# Patient Record
Sex: Male | Born: 1990 | Race: Black or African American | Hispanic: No | Marital: Married | Smoking: Current some day smoker
Health system: Southern US, Community
[De-identification: ages and names within clinical notes are randomized; demographics above are authoritative.]

## PROBLEM LIST (undated history)

## (undated) DIAGNOSIS — F419 Anxiety disorder, unspecified: Secondary | ICD-10-CM

## (undated) DIAGNOSIS — F329 Major depressive disorder, single episode, unspecified: Secondary | ICD-10-CM

## (undated) DIAGNOSIS — F909 Attention-deficit hyperactivity disorder, unspecified type: Secondary | ICD-10-CM

## (undated) DIAGNOSIS — J45909 Unspecified asthma, uncomplicated: Secondary | ICD-10-CM

## (undated) DIAGNOSIS — F32A Depression, unspecified: Secondary | ICD-10-CM

---
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
## 2009-06-20 ENCOUNTER — Emergency Department (HOSPITAL_COMMUNITY): Admission: EM | Admit: 2009-06-20 | Discharge: 2009-06-20 | Payer: Self-pay | Admitting: Emergency Medicine

## 2009-07-05 ENCOUNTER — Emergency Department (HOSPITAL_COMMUNITY): Admission: EM | Admit: 2009-07-05 | Discharge: 2009-07-06 | Payer: Self-pay | Admitting: Emergency Medicine

## 2009-07-28 ENCOUNTER — Emergency Department (HOSPITAL_COMMUNITY): Admission: EM | Admit: 2009-07-28 | Discharge: 2009-07-28 | Payer: Self-pay | Admitting: Emergency Medicine

## 2009-07-29 ENCOUNTER — Ambulatory Visit: Payer: Self-pay | Admitting: Internal Medicine

## 2009-07-29 ENCOUNTER — Emergency Department (HOSPITAL_COMMUNITY): Admission: EM | Admit: 2009-07-29 | Discharge: 2009-07-29 | Payer: Self-pay | Admitting: Emergency Medicine

## 2009-07-29 DIAGNOSIS — D638 Anemia in other chronic diseases classified elsewhere: Secondary | ICD-10-CM | POA: Insufficient documentation

## 2009-07-29 DIAGNOSIS — R079 Chest pain, unspecified: Secondary | ICD-10-CM | POA: Insufficient documentation

## 2009-07-29 DIAGNOSIS — N62 Hypertrophy of breast: Secondary | ICD-10-CM | POA: Insufficient documentation

## 2009-07-29 DIAGNOSIS — E876 Hypokalemia: Secondary | ICD-10-CM

## 2009-07-31 ENCOUNTER — Ambulatory Visit: Admission: RE | Admit: 2009-07-31 | Discharge: 2009-07-31 | Payer: Self-pay | Admitting: Internal Medicine

## 2009-07-31 ENCOUNTER — Encounter: Payer: Self-pay | Admitting: Internal Medicine

## 2009-07-31 ENCOUNTER — Ambulatory Visit: Payer: Self-pay | Admitting: Cardiology

## 2009-07-31 ENCOUNTER — Emergency Department (HOSPITAL_COMMUNITY): Admission: EM | Admit: 2009-07-31 | Discharge: 2009-07-31 | Payer: Self-pay | Admitting: Emergency Medicine

## 2009-08-04 ENCOUNTER — Telehealth: Payer: Self-pay | Admitting: Internal Medicine

## 2009-08-06 ENCOUNTER — Telehealth: Payer: Self-pay | Admitting: Internal Medicine

## 2009-08-15 ENCOUNTER — Telehealth: Payer: Self-pay | Admitting: Physician Assistant

## 2009-08-20 ENCOUNTER — Encounter (INDEPENDENT_AMBULATORY_CARE_PROVIDER_SITE_OTHER): Payer: Self-pay | Admitting: Internal Medicine

## 2009-09-19 ENCOUNTER — Emergency Department (HOSPITAL_COMMUNITY): Admission: EM | Admit: 2009-09-19 | Discharge: 2009-09-19 | Payer: Self-pay | Admitting: Emergency Medicine

## 2009-10-03 ENCOUNTER — Encounter (INDEPENDENT_AMBULATORY_CARE_PROVIDER_SITE_OTHER): Payer: Self-pay | Admitting: *Deleted

## 2009-12-18 ENCOUNTER — Emergency Department (HOSPITAL_COMMUNITY): Admission: EM | Admit: 2009-12-18 | Discharge: 2009-07-25 | Payer: Self-pay | Admitting: Emergency Medicine

## 2010-02-01 ENCOUNTER — Emergency Department (HOSPITAL_COMMUNITY)
Admission: EM | Admit: 2010-02-01 | Discharge: 2010-02-01 | Payer: Self-pay | Source: Home / Self Care | Admitting: Emergency Medicine

## 2010-02-03 LAB — POCT I-STAT, CHEM 8
BUN: 15 mg/dL (ref 6–23)
Chloride: 104 mEq/L (ref 96–112)
Glucose, Bld: 100 mg/dL — ABNORMAL HIGH (ref 70–99)
HCT: 44 % (ref 39.0–52.0)
Potassium: 4.2 mEq/L (ref 3.5–5.1)

## 2010-02-10 NOTE — Progress Notes (Signed)
Summary: pt wants to talk to Dr. Graciela Husbands  Phone Note Call from Patient Call back at Home Phone [phone]   Caller: Patient Reason for Call: Talk to Nurse, Talk to Doctor Summary of Call: pt wants to talk to Dr. Graciela Husbands about test results. He is rtning a call from yesterday, he has no idea who called him all he knows is the person said he dosen't need f/u and his echo was fine but he wants to talk to the Dr. to find out what he found. Initial call taken by: Omer Jack,  August 06, 2009 1:51 PM  Follow-up for Phone Call        [facility] Katina Dung, RN, BSN  August 06, 2009 2:06 PM  returning [phone] Glynda Jaeger  August 06, 2009 2:48 PM  I spoke with the pt and reviewed his echo results.  The pt has received a call from [facility] and an appt has been arranged.   Follow-up by: Julieta Gutting, RN, BSN,  August 06, 2009 4:24 PM

## 2010-02-10 NOTE — Letter (Signed)
Summary: *HSN Results Follow up  [facility]  [address] [place], [place] [address]   Phone: [phone]  Fax: [phone]      10/03/2009   Raynelle Dick Riegler [address] [place], [place]  [address]   Dear  Mr. Giuseppe Cleland,                            ____S.Drinkard,FNP   ____D. Gore,FNP       ____B. McPherson,MD   ____V. Rankins,MD    ____E. Mulberry,MD    ____N. Daphine Deutscher, FNP  ____D. Reche , MD    ____K. Philipp Deputy, MD    ____Other     This letter is to inform you that your recent test(s):  _______Pap Smear    _______Lab Test     _______X-ray    _______ is within acceptable limits  _______ requires a medication change  _______ requires a follow-up lab visit  _______ requires a follow-up visit with your provider   Comments:  We have been trying to reach you.  Please give [place] a call at your earliest convenience.       _________________________________________________________ If you have any questions, please contact our office                     Sincerely,  [place] Shannon [facility]

## 2010-02-10 NOTE — Assessment & Plan Note (Signed)
Summary: chest pains,dizziness   CC:  SOB with some lightheadedness.  Pt reports chest pains.  He was told this was due to muscles but patient disagrees.   .  History of Present Illness: Thomas Vaughn is seen at the request of the Emergency room becsuse of multiple visis over the last few days becasue of complaints of dizziness, shortness of breath and chest pain.  He has been a long time polysubstance abuser who becasue of symtpoms saaid he stopped drugs alcohol and cigareetes five days ago .  He has not had symcope, buthas had palpiationns and there is no family history of sudden death  His episodes are accompanied by acral parasethesia without perioral involvement.    He has some orthostatic intolerance as well.     He was told in the ER that his iron and potassium were low, Hgb 12.6 w normal MCV;  his potassium was 3.1 but previous eval had been 3.7  Current Medications (verified): 1)  Ibuprofen 600 Mg Tabs (Ibuprofen) .... Take One Tablet Every 8 Hrs As Needed For Pain  Allergies (verified): No Known Drug Allergies  Past History:  Past Medical History: Last updated: 07/28/2009 Bradycardia Tobacco abuse Alcohol abuse  Social History: Last updated: 07/28/2009 Tobacco Use - Yes.  1/2-1 ppd Alcohol Use - yes-heavy use (pint of liquor daily) Drug Use - yes-cannabis abuse  Vital Signs:  Patient profile:   20 year old male Height:      67 inches Weight:      144 pounds BMI:     22.64 O2 Sat:      99 % on Room air Pulse rate:   69 / minute Pulse rhythm:   regular BP sitting:   132 / 70  (left arm) Cuff size:   regular  Vitals Entered By: Judithe Modest CMA (July 29, 2009 3:00 PM)  O2 Flow:  Room air  Physical Exam  General:  Alert and oriented yound african [place] male with his shorts at his knees and ECG stickers( 2 sets) still on chest from ER  in no acute distress. HEENT  normal . Neck veins were flat; carotids brisk and full without bruits. No lymphadenopathy.  Back without kyphosis. leftsided gynecomastia Lungs clear. Heart sounds regular without murmurs or gallops. PMI nondisplaced. Abdomen soft with active bowel sounds without midline pulsation or hepatomegaly. Femoral pulses and distal pulses intact. Extremities were without clubbing cyanosis or edemaSkin warm and dry. Neurological exam grossly normal affect is engaging   Impression & Recommendations:  Problem # 1:  CHEST PAIN UNSPECIFIED (ICD-786.50) The patients symptoms are hard to sort out by my history and in the context of polysubstance use and now abrupt disuse.  We will check an echo and the ECG is normal apart from early repolarization.  If that is abnormal, will need more extensive cardiac evaluation , otherwise not.   Orders: Misc. Referral (Misc. 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  He will need medical evalauation.  the other interesting thing is wheterh it might be related to the hypokalemia via a mineralocortacoid process  Patient Instructions: 1)  Your physician has requested that you have an echocardiogram.  Echocardiography is a painless test that uses sound waves to create images of your heart. It provides your doctor with information about the size and shape of your heart and how well your heart's chambers  and valves are working.  This procedure takes approximately one hour. There are no restrictions for this procedure. 2)  You have been referred to Thomas Newcomer, PA at [facility].

## 2010-02-10 NOTE — Progress Notes (Signed)
  Phone Note Outgoing Call   Summary of Call: Please get this patient scheduled with me once he has had eligibility. Reply to me with date of appt. Initial call taken by: Tereso Newcomer PA-C,  August 15, 2009 10:23 PM  Follow-up for Phone Call        Left message with relative member to return phone called.Manon Hilding  August 18, 2009 9:14 AM  LEFT A MESSAGE ON PT VOICE MAIL.Manon Hilding  August 19, 2009 4:12 PM  Unable to reach the pt over the phone so I sent a no show letter.Manon Hilding  August 20, 2009 9:40 AM     Appended Document:     Phone Note Outgoing Call   Summary of Call: Have we heard from this patient?  We were trying to get him in for an appt.  He should have been set up for eligibility. Initial call taken by: Brynda Rim,  September 30, 2009 2:13 PM  Follow-up for Phone Call        Left message on answering machine for pt to call back.Marland KitchenMarland KitchenMarland KitchenArmenia Shannon  September 30, 2009 3:28 PM   Left message on answering machine for pt to call back.Marland KitchenMarland KitchenMarland KitchenArmenia Shannon  October 01, 2009 12:40 PM   Left message on answering machine for pt to call.... will mail letter [facility]  October 03, 2009 8:46 AM

## 2010-02-10 NOTE — Letter (Signed)
Summary: Letter//MAILED TO PT  Letter//MAILED TO PT   Imported By: Arta Bruce 08/28/2009 11:11:03  _____________________________________________________________________  External Attachment:    Type:   Image     Comment:   External Document

## 2010-02-10 NOTE — Progress Notes (Signed)
Summary: results of ultrasound/when follow up needed  Phone Note Call from Patient   Caller: Patient Reason for Call: Talk to Nurse Summary of Call: pt seen last week-was referred to [place] for heart us-wants to know if he needs follow up-and if we have the resultspls call [phone] Initial call taken by: Glynda Jaeger,  August 04, 2009 12:16 PM  Follow-up for Phone Call        spoke with pt, aware will call after dr Graciela Husbands reviews testing Deliah Goody, RN  August 04, 2009 12:38 PM   Additional Follow-up for Phone Call Additional follow up Details #1::        echo normal, no cardiac follwup,  [facility]  [place] [facility] has been contacted and they are working on checking eligibility Additional Follow-up by: Nathen May, MD, [facility],  August 05, 2009 9:18 AM     Appended Document: results of ultrasound/when follow up needed Pt notified of Dr Odessa Fleming recommendations.

## 2010-03-09 ENCOUNTER — Emergency Department (HOSPITAL_COMMUNITY)
Admission: EM | Admit: 2010-03-09 | Discharge: 2010-03-10 | Disposition: A | Discharge status: Home / Self Care | Payer: Self-pay | Attending: Emergency Medicine | Admitting: Emergency Medicine

## 2010-03-09 DIAGNOSIS — F1010 Alcohol abuse, uncomplicated: Secondary | ICD-10-CM | POA: Insufficient documentation

## 2010-03-09 DIAGNOSIS — F1210 Cannabis abuse, uncomplicated: Secondary | ICD-10-CM | POA: Insufficient documentation

## 2010-03-09 DIAGNOSIS — F17200 Nicotine dependence, unspecified, uncomplicated: Secondary | ICD-10-CM | POA: Insufficient documentation

## 2010-03-11 ENCOUNTER — Emergency Department (HOSPITAL_BASED_OUTPATIENT_CLINIC_OR_DEPARTMENT_OTHER)
Admission: EM | Admit: 2010-03-11 | Discharge: 2010-03-11 | Disposition: A | Discharge status: Home / Self Care | Payer: Self-pay | Attending: Emergency Medicine | Admitting: Emergency Medicine

## 2010-03-11 DIAGNOSIS — R109 Unspecified abdominal pain: Secondary | ICD-10-CM | POA: Insufficient documentation

## 2010-03-11 DIAGNOSIS — F17200 Nicotine dependence, unspecified, uncomplicated: Secondary | ICD-10-CM | POA: Insufficient documentation

## 2010-03-11 DIAGNOSIS — K2970 Gastritis, unspecified, without bleeding: Secondary | ICD-10-CM | POA: Insufficient documentation

## 2010-03-11 DIAGNOSIS — R209 Unspecified disturbances of skin sensation: Secondary | ICD-10-CM | POA: Insufficient documentation

## 2010-03-11 LAB — COMPREHENSIVE METABOLIC PANEL
ALT: 16 U/L (ref 0–53)
AST: 37 U/L (ref 0–37)
Alkaline Phosphatase: 82 U/L (ref 39–117)
CO2: 23 mEq/L (ref 19–32)
Chloride: 104 mEq/L (ref 96–112)
GFR calc Af Amer: >60 mL/min (ref >60)
GFR calc non Af Amer: >60 mL/min (ref >60)
Glucose, Bld: 75 mg/dL (ref 70–99)
Sodium: 141 mEq/L (ref 135–145)
Total Bilirubin: 0.8 mg/dL (ref 0.3–1.2)

## 2010-03-11 LAB — CBC
HCT: 38.3 % — ABNORMAL LOW (ref 39.0–52.0)
Hemoglobin: 13.4 g/dL (ref 13.0–17.0)
RBC: 4.63 MIL/uL (ref 4.22–5.81)

## 2010-03-11 LAB — GLUCOSE, CAPILLARY: Glucose-Capillary: 72 mg/dL (ref 70–99)

## 2010-03-28 LAB — POCT CARDIAC MARKERS
CKMB, poc: <1 ng/mL — ABNORMAL LOW (ref 1.0–8.0)
CKMB, poc: <1 ng/mL — ABNORMAL LOW (ref 1.0–8.0)
Myoglobin, poc: 36.1 ng/mL (ref 12–200)
Myoglobin, poc: 36.9 ng/mL (ref 12–200)
Myoglobin, poc: 49 ng/mL (ref 12–200)
Myoglobin, poc: 51.7 ng/mL (ref 12–200)
Troponin i, poc: <0.05 ng/mL (ref 0.00–0.09)

## 2010-03-28 LAB — POCT I-STAT, CHEM 8
BUN: 15 mg/dL (ref 6–23)
BUN: 7 mg/dL (ref 6–23)
Calcium, Ion: 1.17 mmol/L (ref 1.12–1.32)
Chloride: 106 mEq/L (ref 96–112)
Creatinine, Ser: 1 mg/dL (ref 0.4–1.5)
Creatinine, Ser: 1.2 mg/dL (ref 0.4–1.5)
Hemoglobin: 13.6 g/dL (ref 13.0–17.0)
Hemoglobin: 13.9 g/dL (ref 13.0–17.0)
Potassium: 3.2 mEq/L — ABNORMAL LOW (ref 3.5–5.1)
Potassium: 3.8 mEq/L (ref 3.5–5.1)
Sodium: 139 mEq/L (ref 135–145)
Sodium: 140 mEq/L (ref 135–145)
Sodium: 140 mEq/L (ref 135–145)
TCO2: 18 mmol/L (ref 0–100)
TCO2: 21 mmol/L (ref 0–100)
TCO2: 22 mmol/L (ref 0–100)

## 2010-03-28 LAB — RAPID URINE DRUG SCREEN, HOSP PERFORMED
Barbiturates: NOT DETECTED
Benzodiazepines: POSITIVE — AB
Cocaine: NOT DETECTED
Cocaine: NOT DETECTED
Tetrahydrocannabinol: NOT DETECTED

## 2010-03-28 LAB — URINALYSIS, ROUTINE W REFLEX MICROSCOPIC
Glucose, UA: NEGATIVE mg/dL
Ketones, ur: NEGATIVE mg/dL
pH: 6.5 (ref 5.0–8.0)

## 2010-03-28 LAB — D-DIMER, QUANTITATIVE: D-Dimer, Quant: <0.22 ug/mL-FEU (ref 0.00–0.48)

## 2010-03-28 LAB — CBC
MCH: 30.6 pg (ref 26.0–34.0)
MCHC: 34.3 g/dL (ref 30.0–36.0)
MCV: 89.2 fL (ref 78.0–100.0)
Platelets: 237 10*3/uL (ref 150–400)
RDW: 14 % (ref 11.5–15.5)

## 2010-03-28 LAB — DIFFERENTIAL
Basophils Absolute: 0 10*3/uL (ref 0.0–0.1)
Eosinophils Absolute: 0.1 10*3/uL (ref 0.0–0.7)
Eosinophils Relative: 2 % (ref 0–5)

## 2010-03-29 LAB — CBC
MCH: 30.9 pg (ref 26.0–34.0)
MCHC: 34.3 g/dL (ref 30.0–36.0)
Platelets: 225 10*3/uL (ref 150–400)
RBC: 4.53 MIL/uL (ref 4.22–5.81)
RDW: 13.3 % (ref 11.5–15.5)

## 2010-03-29 LAB — DIFFERENTIAL
Eosinophils Relative: 1 % (ref 0–5)
Lymphocytes Relative: 22 % (ref 12–46)
Lymphs Abs: 1.6 10*3/uL (ref 0.7–4.0)
Monocytes Absolute: 0.5 10*3/uL (ref 0.1–1.0)
Monocytes Relative: 6 % (ref 3–12)
Neutro Abs: 5.2 10*3/uL (ref 1.7–7.7)

## 2010-03-29 LAB — COMPREHENSIVE METABOLIC PANEL
AST: 29 U/L (ref 0–37)
Albumin: 4 g/dL (ref 3.5–5.2)
BUN: 13 mg/dL (ref 6–23)
Calcium: 9.5 mg/dL (ref 8.4–10.5)
Chloride: 104 mEq/L (ref 96–112)
Creatinine, Ser: 0.95 mg/dL (ref 0.4–1.5)
GFR calc Af Amer: >60 mL/min (ref >60)
Total Protein: 7.2 g/dL (ref 6.0–8.3)

## 2010-03-30 LAB — RAPID URINE DRUG SCREEN, HOSP PERFORMED: Cocaine: NOT DETECTED

## 2010-04-27 LAB — GC/CHLAMYDIA PROBE AMP, GENITAL: GC Probe Amp, Genital: NEGATIVE

## 2010-04-27 LAB — HERPES SIMPLEX VIRUS CULTURE: Culture: NOT DETECTED

## 2010-04-27 LAB — RPR: RPR Ser Ql: NONREACTIVE

## 2010-09-21 ENCOUNTER — Emergency Department (HOSPITAL_COMMUNITY)
Admission: EM | Admit: 2010-09-21 | Discharge: 2010-09-21 | Disposition: A | Discharge status: Home / Self Care | Payer: Self-pay | Attending: Emergency Medicine | Admitting: Emergency Medicine

## 2010-09-21 DIAGNOSIS — L298 Other pruritus: Secondary | ICD-10-CM | POA: Insufficient documentation

## 2010-09-21 DIAGNOSIS — J351 Hypertrophy of tonsils: Secondary | ICD-10-CM | POA: Insufficient documentation

## 2010-09-21 DIAGNOSIS — J029 Acute pharyngitis, unspecified: Secondary | ICD-10-CM | POA: Insufficient documentation

## 2010-09-21 DIAGNOSIS — L2989 Other pruritus: Secondary | ICD-10-CM | POA: Insufficient documentation

## 2010-09-21 DIAGNOSIS — R21 Rash and other nonspecific skin eruption: Secondary | ICD-10-CM | POA: Insufficient documentation

## 2010-09-21 DIAGNOSIS — N62 Hypertrophy of breast: Secondary | ICD-10-CM | POA: Insufficient documentation

## 2010-10-05 ENCOUNTER — Emergency Department (HOSPITAL_COMMUNITY)
Admission: EM | Admit: 2010-10-05 | Discharge: 2010-10-05 | Disposition: A | Discharge status: Home / Self Care | Payer: Self-pay | Attending: Emergency Medicine | Admitting: Emergency Medicine

## 2010-10-05 ENCOUNTER — Emergency Department (HOSPITAL_COMMUNITY): Payer: Self-pay

## 2010-10-05 DIAGNOSIS — R0602 Shortness of breath: Secondary | ICD-10-CM | POA: Insufficient documentation

## 2010-10-05 DIAGNOSIS — F1010 Alcohol abuse, uncomplicated: Secondary | ICD-10-CM | POA: Insufficient documentation

## 2010-10-05 DIAGNOSIS — K2920 Alcoholic gastritis without bleeding: Secondary | ICD-10-CM | POA: Insufficient documentation

## 2010-10-05 DIAGNOSIS — R079 Chest pain, unspecified: Secondary | ICD-10-CM | POA: Insufficient documentation

## 2010-10-07 ENCOUNTER — Emergency Department (HOSPITAL_COMMUNITY)
Admission: EM | Admit: 2010-10-07 | Discharge: 2010-10-08 | Disposition: A | Discharge status: Home / Self Care | Payer: Self-pay | Attending: Emergency Medicine | Admitting: Emergency Medicine

## 2010-10-07 DIAGNOSIS — B86 Scabies: Secondary | ICD-10-CM | POA: Insufficient documentation

## 2010-10-07 DIAGNOSIS — L298 Other pruritus: Secondary | ICD-10-CM | POA: Insufficient documentation

## 2010-10-07 DIAGNOSIS — R21 Rash and other nonspecific skin eruption: Secondary | ICD-10-CM | POA: Insufficient documentation

## 2010-10-07 DIAGNOSIS — L2989 Other pruritus: Secondary | ICD-10-CM | POA: Insufficient documentation

## 2010-10-08 LAB — URINALYSIS, ROUTINE W REFLEX MICROSCOPIC
Hgb urine dipstick: NEGATIVE
Specific Gravity, Urine: 1.033 — ABNORMAL HIGH
Urobilinogen, UA: 0.2
pH: 6

## 2010-10-08 LAB — GC/CHLAMYDIA PROBE AMP, GENITAL: Chlamydia, DNA Probe: NEGATIVE

## 2010-12-06 ENCOUNTER — Emergency Department (HOSPITAL_COMMUNITY)
Admission: EM | Admit: 2010-12-06 | Discharge: 2010-12-06 | Disposition: A | Discharge status: Home / Self Care | Payer: Self-pay | Attending: Emergency Medicine | Admitting: Emergency Medicine

## 2010-12-06 ENCOUNTER — Encounter: Payer: Self-pay | Admitting: *Deleted

## 2010-12-06 DIAGNOSIS — R21 Rash and other nonspecific skin eruption: Secondary | ICD-10-CM | POA: Insufficient documentation

## 2010-12-06 DIAGNOSIS — R112 Nausea with vomiting, unspecified: Secondary | ICD-10-CM | POA: Insufficient documentation

## 2010-12-06 DIAGNOSIS — Z202 Contact with and (suspected) exposure to infections with a predominantly sexual mode of transmission: Secondary | ICD-10-CM | POA: Insufficient documentation

## 2010-12-06 MED ORDER — LIDOCAINE HCL (PF) 1 % IJ SOLN
1.0000 mL | Freq: Once | INTRAMUSCULAR | Status: AC
  Administered 2010-12-06: 5 mL via INTRADERMAL
  Filled 2010-12-06: qty 5

## 2010-12-06 MED ORDER — ONDANSETRON 4 MG PO TBDP
8.0000 mg | ORAL_TABLET | Freq: Once | ORAL | Status: AC
  Administered 2010-12-06: 8 mg via ORAL
  Filled 2010-12-06: qty 2

## 2010-12-06 MED ORDER — CEFTRIAXONE SODIUM 250 MG IJ SOLR
250.0000 mg | Freq: Once | INTRAMUSCULAR | Status: AC
  Administered 2010-12-06: 250 mg via INTRAMUSCULAR
  Filled 2010-12-06: qty 250

## 2010-12-06 MED ORDER — PROMETHAZINE HCL 12.5 MG PO TABS: 12.5000 mg | ORAL_TABLET | Freq: Four times a day (QID) | ORAL | Status: DC | PRN

## 2010-12-06 MED ORDER — PERMETHRIN 5 % EX CREA: TOPICAL_CREAM | Freq: Once | CUTANEOUS | Status: AC

## 2010-12-06 MED ORDER — AZITHROMYCIN 250 MG PO TABS
1000.0000 mg | ORAL_TABLET | Freq: Once | ORAL | Status: AC
  Administered 2010-12-06: 1000 mg via ORAL
  Filled 2010-12-06: qty 4

## 2010-12-06 NOTE — ED Notes (Signed)
Pt's friend at bedside requesting food for him and pt.  Advised pt is NPO at this time.

## 2010-12-06 NOTE — ED Notes (Signed)
Patient reports he had a long night last night,  Today he has not been able to keep anything down.  He has had emesis.  Patient complains of upper abd pain, mid sternal pain as well.

## 2010-12-06 NOTE — ED Provider Notes (Signed)
History     CSN: [id number] Arrival date & time: 12/06/2010 12:55 PM   First MD Initiated Contact with Patient 12/06/10 1434      Chief Complaint  Patient presents with  . Emesis    (Consider location/radiation/quality/duration/timing/severity/associated sxs/prior treatment) Patient is a 20 y.o. male presenting with vomiting. The history is provided by the patient.  Emesis  This is a new problem. The current episode started 6 to 12 hours ago. The problem occurs 5 to 10 times per day. The problem has not changed since onset.There has been no fever. Pertinent negatives include no abdominal pain, no arthralgias, no chills, no cough, no diarrhea, no fever, no headaches, no myalgias, no sweats and no URI.  Pt reports that he was out drinking last night. States he drank a "large amoutn of alcohol." States today he is nauseated, and has been vomiting all day .Unable to keep anything down. Denies fever, chills, abdominal pain. Pt also states his girlfriend told him she has chlamydia. States wants to be checked and treated. Denies any symptoms. Also reports rash that he was told was "scabies" states he was treated for it, but only used some of the cream, and lost the rest of it. Rash resolved but itching continues.  History reviewed. No pertinent past medical history.  History reviewed. No pertinent past surgical history.  No family history on file.  History  Substance Use Topics  . Smoking status: Never Smoker   . Smokeless tobacco: Not on file  . Alcohol Use: Yes      Review of Systems  Constitutional: Negative for fever and chills.  HENT: Negative for ear pain, congestion, sore throat, rhinorrhea and neck stiffness.   Eyes: Negative.   Respiratory: Negative for cough.   Cardiovascular: Negative.   Gastrointestinal: Positive for nausea and vomiting. Negative for abdominal pain and diarrhea.  Genitourinary: Negative for dysuria, urgency, frequency, hematuria, penile swelling,  scrotal swelling, genital sores, penile pain and testicular pain.  Musculoskeletal: Negative for myalgias and arthralgias.  Skin: Negative.   Neurological: Negative.  Negative for headaches.  Hematological: Negative.   Psychiatric/Behavioral: Negative.     Allergies  Review of patient's allergies indicates no known allergies.  Home Medications  No current outpatient prescriptions on file.  BP 109/77  Pulse 70  Temp(Src) 98.1 F (36.7 C) (Oral)  Resp 20  SpO2 100%  Physical Exam  Nursing note and vitals reviewed. Constitutional: He is oriented to person, place, and time. He appears well-developed and well-nourished. No distress.  HENT:  Head: Normocephalic.  Eyes: Conjunctivae are normal. Pupils are equal, round, and reactive to light.  Neck: Neck supple.  Cardiovascular: Normal rate and regular rhythm.   Pulmonary/Chest: Effort normal and breath sounds normal. No respiratory distress.  Abdominal: Soft. Bowel sounds are normal. There is no tenderness. There is no rebound and no guarding.  Genitourinary: Penis normal. No penile tenderness.       No penile lesions or discharge  Musculoskeletal: Normal range of motion.  Neurological: He is alert and oriented to person, place, and time.  Skin: Skin is warm and dry. No rash noted.  Psychiatric: He has a normal mood and affect.    ED Course  Procedures (including critical care time)  Pt in NAD. Laughing and smiling. Abdomen non tender. Pt not vomiting in ED. VS normal. Will try zofran ODT. GC/chlamydia cultures obtained, will treat with rocephin and zithromax. Will try PO challenge.  3:54 PM zofran 8mg  ODT given. Pt tolerated PO  fluids and crackers. Pt in NAD. VS normal. Will d/c home.   MDM          Lottie Mussel, PA 12/06/10 1555

## 2010-12-07 LAB — GC/CHLAMYDIA PROBE AMP, GENITAL
Chlamydia, DNA Probe: NEGATIVE
GC Probe Amp, Genital: NEGATIVE

## 2010-12-07 NOTE — ED Provider Notes (Signed)
Medical screening examination/treatment/procedure(s) were performed by non-physician practitioner and as supervising physician I was immediately available for consultation/collaboration.   Radley Teston M Margarette Vannatter, DO 12/07/10 0055 

## 2011-03-12 ENCOUNTER — Emergency Department (HOSPITAL_COMMUNITY)
Admission: EM | Admit: 2011-03-12 | Discharge: 2011-03-12 | Disposition: A | Discharge status: Home Health | Payer: Self-pay | Attending: Emergency Medicine | Admitting: Emergency Medicine

## 2011-03-12 ENCOUNTER — Other Ambulatory Visit: Payer: Self-pay

## 2011-03-12 DIAGNOSIS — R630 Anorexia: Secondary | ICD-10-CM | POA: Insufficient documentation

## 2011-03-12 DIAGNOSIS — R5381 Other malaise: Secondary | ICD-10-CM | POA: Insufficient documentation

## 2011-03-12 DIAGNOSIS — K921 Melena: Secondary | ICD-10-CM | POA: Insufficient documentation

## 2011-03-12 DIAGNOSIS — R110 Nausea: Secondary | ICD-10-CM | POA: Insufficient documentation

## 2011-03-12 DIAGNOSIS — I951 Orthostatic hypotension: Secondary | ICD-10-CM | POA: Insufficient documentation

## 2011-03-12 DIAGNOSIS — R42 Dizziness and giddiness: Secondary | ICD-10-CM | POA: Insufficient documentation

## 2011-03-12 DIAGNOSIS — R599 Enlarged lymph nodes, unspecified: Secondary | ICD-10-CM | POA: Insufficient documentation

## 2011-03-12 DIAGNOSIS — F1010 Alcohol abuse, uncomplicated: Secondary | ICD-10-CM | POA: Insufficient documentation

## 2011-03-12 DIAGNOSIS — H538 Other visual disturbances: Secondary | ICD-10-CM | POA: Insufficient documentation

## 2011-03-12 DIAGNOSIS — R5383 Other fatigue: Secondary | ICD-10-CM | POA: Insufficient documentation

## 2011-03-12 LAB — PROTIME-INR
INR: 1 (ref 0.00–1.49)
Prothrombin Time: 13.4 seconds (ref 11.6–15.2)

## 2011-03-12 LAB — HEPATIC FUNCTION PANEL
ALT: 43 U/L (ref 0–53)
AST: 52 U/L — ABNORMAL HIGH (ref 0–37)
Total Protein: 9 g/dL — ABNORMAL HIGH (ref 6.0–8.3)

## 2011-03-12 LAB — URINALYSIS, ROUTINE W REFLEX MICROSCOPIC
Hgb urine dipstick: NEGATIVE
Leukocytes, UA: NEGATIVE
Specific Gravity, Urine: 1.029 (ref 1.005–1.030)
Urobilinogen, UA: 1 mg/dL (ref 0.0–1.0)

## 2011-03-12 LAB — BASIC METABOLIC PANEL
CO2: 27 mEq/L (ref 19–32)
GFR calc non Af Amer: >90 mL/min (ref >90)
Glucose, Bld: 83 mg/dL (ref 70–99)
Potassium: 3.7 mEq/L (ref 3.5–5.1)
Sodium: 140 mEq/L (ref 135–145)

## 2011-03-12 LAB — DIFFERENTIAL
Lymphocytes Relative: 20 % (ref 12–46)
Lymphs Abs: 1.4 10*3/uL (ref 0.7–4.0)
Neutro Abs: 5 10*3/uL (ref 1.7–7.7)
Neutrophils Relative %: 73 % (ref 43–77)

## 2011-03-12 LAB — CBC
Platelets: 271 10*3/uL (ref 150–400)
RBC: 4.91 MIL/uL (ref 4.22–5.81)
WBC: 6.8 10*3/uL (ref 4.0–10.5)

## 2011-03-12 MED ORDER — SODIUM CHLORIDE 0.9 % IV BOLUS (SEPSIS)
1000.0000 mL | Freq: Once | INTRAVENOUS | Status: AC
  Administered 2011-03-12: 1000 mL via INTRAVENOUS

## 2011-03-12 NOTE — ED Notes (Signed)
Pt st's he feels much better, able to keep po fluids down.

## 2011-03-12 NOTE — ED Notes (Addendum)
Pt reports he woke this am and stated that he had dark stool. Pt reports he had syncopal episode. Reports dizziness. Reports he drinks alcohol everyday. States he has mid upper gastric discomfort.

## 2011-03-12 NOTE — ED Notes (Signed)
Ginger Ale given to pt.  Pt talking on phone.

## 2011-03-12 NOTE — ED Notes (Signed)
Spoke with pt re: substance abuse resources.  Referral sheet given.

## 2011-03-12 NOTE — Discharge Instructions (Signed)
Alcohol Problems Most adults who drink alcohol drink in moderation (not a lot) are at low risk for developing problems related to their drinking. However, all drinkers, including low-risk drinkers, should know about the health risks connected with drinking alcohol. RECOMMENDATIONS FOR LOW-RISK DRINKING  Drink in moderation. Moderate drinking is defined as follows:   Men - no more than 2 drinks per day.   Nonpregnant women - no more than 1 drink per day.   Over age 22 - no more than 1 drink per day.  A standard drink is 12 grams of pure alcohol, which is equal to a 12 ounce bottle of beer or wine cooler, a 5 ounce glass of wine, or 1.5 ounces of distilled spirits (such as whiskey, brandy, vodka, or rum).  ABSTAIN FROM (DO NOT DRINK) ALCOHOL:  When pregnant or considering pregnancy.   When taking a medication that interacts with alcohol.   If you are alcohol dependent.   A medical condition that prohibits drinking alcohol (such as ulcer, liver disease, or heart disease).  DISCUSS WITH YOUR CAREGIVER:  If you are at risk for coronary heart disease, discuss the potential benefits and risks of alcohol use: Light to moderate drinking is associated with lower rates of coronary heart disease in certain populations (for example, men over age 22 and postmenopausal women). Infrequent or nondrinkers are advised not to begin light to moderate drinking to reduce the risk of coronary heart disease so as to avoid creating an alcohol-related problem. Similar protective effects can likely be gained through proper diet and exercise.   Women and the elderly have smaller amounts of body water than men. As a result women and the elderly achieve a higher blood alcohol concentration after drinking the same amount of alcohol.   Exposing a fetus to alcohol can cause a broad range of birth defects referred to as Fetal Alcohol Syndrome (FAS) or Alcohol-Related Birth Defects (ARBD). Although FAS/ARBD is connected with  excessive alcohol consumption during pregnancy, studies also have reported neurobehavioral problems in infants born to mothers reporting drinking an average of 1 drink per day during pregnancy.   Heavier drinking (the consumption of more than 4 drinks per occasion by men and more than 3 drinks per occasion by women) impairs learning (cognitive) and psychomotor functions and increases the risk of alcohol-related problems, including accidents and injuries.  CAGE QUESTIONS:   Have you ever felt that you should Cut down on your drinking?   Have people Annoyed you by criticizing your drinking?   Have you ever felt bad or Guilty about your drinking?   Have you ever had a drink first thing in the morning to steady your nerves or get rid of a hangover (Eye opener)?  If you answered positively to any of these questions: You may be at risk for alcohol-related problems if alcohol consumption is:   Men: Greater than 14 drinks per week or more than 4 drinks per occasion.   Women: Greater than 7 drinks per week or more than 3 drinks per occasion.  Do you or your family have a medical history of alcohol-related problems, such as:  Blackouts.   Sexual dysfunction.   Depression.   Trauma.   Liver dysfunction.   Sleep disorders.   Hypertension.   Chronic abdominal pain.   Has your drinking ever caused you problems, such as problems with your family, problems with your work (or school) performance, or accidents/injuries?   Do you have a compulsion to drink or a preoccupation  with drinking?   Do you have poor control or are you unable to stop drinking once you have started?   Do you have to drink to avoid withdrawal symptoms?   Do you have problems with withdrawal such as tremors, nausea, sweats, or mood disturbances?   Does it take more alcohol than in the past to get you high?   Do you feel a strong urge to drink?   Do you change your plans so that you can have a drink?   Do you ever  drink in the morning to relieve the shakes or a hangover?  If you have answered a number of the previous questions positively, it may be time for you to talk to your caregivers, family, and friends and see if they think you have a problem. Alcoholism is a chemical dependency that keeps getting worse and will eventually destroy your health and relationships. Many alcoholics end up dead, impoverished, or in prison. This is often the end result of all chemical dependency.  Do not be discouraged if you are not ready to take action immediately.   Decisions to change behavior often involve up and down desires to change and feeling like you cannot decide.   Try to think more seriously about your drinking behavior.   Think of the reasons to quit.  WHERE TO GO FOR ADDITIONAL INFORMATION   The National Institute on Alcohol Abuse and Alcoholism (NIAAA)www.niaaa.nih.gov   [employer] on Alcoholism and Drug Dependence (NCADD)www.ncadd.org   American Society of Addiction Medicine (ASAM)www.https://anderson-johnson.com/  Document Released: 12/28/2004 Document Revised: 09/09/2010 Document Reviewed: 08/16/2007 [facility] Patient Information 2012 [place], [place].Orthostatic Hypotension Orthostatic hypotension is a sudden fall in blood pressure. It occurs when a person goes from a sitting or lying position to a standing position. CAUSES   Loss of body fluids (dehydration).   Medicines that lower blood pressure.   Sudden changes in posture, such as sudden standing when you have been sitting or lying down.   Taking too much of your medicine.  SYMPTOMS   Lightheadedness or dizziness.   Fainting or near-fainting.   A fast heart rate (tachycardia).   Weakness.   Feeling tired (fatigue).  DIAGNOSIS  Your caregiver may find the cause of orthostatic hypotension through:  A history and/or physical exam.   Checking your blood pressure. Your caregiver will check your blood pressure when you are:   Lying down.    Sitting.   Standing.   Tilt table testing. In this test, you are placed on a table that goes from a lying position to a standing position. You will be strapped to the table. This test helps to monitor your blood pressure and heart rate when you are in different positions.  TREATMENT   If orthostatic hypotension is caused by your medicines, your caregiver will need to adjust your dosage. Do not stop or adjust your medicine on your own.   When changing positions, make these changes slowly. This allows your body to adjust to the different position.   Compression stockings that are worn on your lower legs may be helpful.   Your caregiver may have you consume extra salt. Do not add extra salt to your diet unless directed by your caregiver.   Eat frequent, small meals. Avoid sudden standing after eating.   Avoid hot showers or excessive heat.   Your caregiver may give you fluids through the vein (intravenous).   Your caregiver may put you on medicine to help enhance fluid retention.  SEEK  IMMEDIATE MEDICAL CARE IF:   You faint or have a near-fainting episode. Call your local emergency services (911 in [place]   You have or develop chest pain.   You feel sick to your stomach (nauseous) or vomit.   You have a loss of feeling or movement in your arms or legs.   You have difficulty talking, slurred speech, or you are unable to talk.   You have difficulty thinking or have confused thinking.  MAKE SURE YOU:   Understand these instructions.   Will watch your condition.   Will get help right away if you are not doing well or get worse.  Document Released: 12/18/2001 Document Revised: 09/09/2010 Document Reviewed: 04/12/2008 [facility] Patient Information 2012 [place], [place].

## 2011-03-12 NOTE — ED Provider Notes (Signed)
History     CSN: [id number]  Arrival date & time 03/12/11  1208   First MD Initiated Contact with Patient 03/12/11 1406      Chief Complaint  Patient presents with  . Melena    (Consider location/radiation/quality/duration/timing/severity/associated sxs/prior treatment) HPI Comments: History of alcohol abuse in the form of one bottle of vodka a day presenting from home with episode of dark stools. He says the stool was black color this morning. He then had a syncopal episode from going from sitting to standing and fell lightheaded and dizzy. Denies any vertigo, chest pain, shortness of breath. He did have nausea, clammy hands and blurred vision. His second syncopal episode when getting up from the couch to standing. He admits to poor by mouth intake. His last drink was 10 PM. He denies abdominal pain, nausea or vomiting currently. He speaking on the phone no distress.  The history is provided by the patient.    No past medical history on file.  No past surgical history on file.  No family history on file.  History  Substance Use Topics  . Smoking status: Never Smoker   . Smokeless tobacco: Not on file  . Alcohol Use: Yes      Review of Systems  Constitutional: Positive for fatigue. Negative for fever, activity change and appetite change.  HENT: Negative for congestion and rhinorrhea.   Respiratory: Negative for cough, chest tightness and shortness of breath.   Cardiovascular: Negative for chest pain.  Gastrointestinal: Positive for blood in stool. Negative for nausea, vomiting and abdominal pain.  Genitourinary: Negative for dysuria, flank pain and testicular pain.  Musculoskeletal: Negative for back pain.  Skin: Negative for rash.  Neurological: Positive for syncope.  Hematological: Positive for adenopathy.  Psychiatric/Behavioral: Negative.     Allergies  Review of patient's allergies indicates no known allergies.  Home Medications  No current outpatient  prescriptions on file.  BP 132/73  Pulse 105  Temp(Src) 99.1 F (37.3 C) (Oral)  Resp 16  SpO2 99%  Physical Exam  Constitutional: He is oriented to person, place, and time. He appears well-developed and well-nourished. No distress.  HENT:  Head: Normocephalic and atraumatic.  Mouth/Throat: Oropharynx is clear and moist. No oropharyngeal exudate.  Eyes: Conjunctivae are normal. Pupils are equal, round, and reactive to light.  Neck: Normal range of motion. Neck supple.  Cardiovascular: Normal rate, regular rhythm and normal heart sounds.   Pulmonary/Chest: Effort normal and breath sounds normal. No respiratory distress.  Abdominal: Soft. There is no tenderness. There is no rebound and no guarding.  Genitourinary: Penis normal. Guaiac negative stool.       Brown stool, guaiac negative, no hemorrhage no fissures  Musculoskeletal: Normal range of motion. He exhibits no edema and no tenderness.  Neurological: He is alert and oriented to person, place, and time. No cranial nerve deficit.  Skin: Skin is warm.    ED Course  Procedures (including critical care time)  Labs Reviewed  HEPATIC FUNCTION PANEL - Abnormal; Notable for the following:    Total Protein 9.0 (*)    AST 52 (*)    All other components within normal limits  URINALYSIS, ROUTINE W REFLEX MICROSCOPIC - Abnormal; Notable for the following:    Ketones, ur 15 (*)    All other components within normal limits  CBC  DIFFERENTIAL  BASIC METABOLIC PANEL  PROTIME-INR  LIPASE, BLOOD   No results found.   1. Orthostatic hypotension   2. Alcohol abuse  MDM  Episode of black stools with syncopal episodes at home. Back to baseline now. Nonfocal neuro exam. Stool is guaiac-negative. history of alcohol abuse. No evidence of active GI bleed.  Orthostatic vitals are positive. Hemoglobin stable. Patient no distress. Syncope likely related to dehydration. Social worker at bedside giving alcohol outpatient  resources.   Date: 03/12/2011  Rate: 62  Rhythm: normal sinus rhythm  QRS Axis: normal  Intervals: PR prolonged  ST/T Wave abnormalities: normal  Conduction Disutrbances:none  Narrative Interpretation:   Old EKG Reviewed: unchanged       Glynn Octave, MD 03/12/11 1651

## 2011-03-12 NOTE — ED Notes (Signed)
Pt reports he has been a heavy drinker since approx 21 years old. States he usually drinks about a can of beer everyday but just recently started drinking hard liquor. Pt reports multiple family members have reccommended he quit but he doesn't feel he has an addiction. Pt is open to outpt resources. Social work contacted for referral. Pt reports "going to the club last night and drinking a lot." Pt reports this morning he drank a bunch of water, then vomited and passed out twice at home.

## 2011-05-01 ENCOUNTER — Emergency Department (HOSPITAL_COMMUNITY): Payer: Self-pay

## 2011-05-01 ENCOUNTER — Encounter (HOSPITAL_COMMUNITY): Payer: Self-pay | Admitting: Emergency Medicine

## 2011-05-01 ENCOUNTER — Emergency Department (HOSPITAL_COMMUNITY)
Admission: EM | Admit: 2011-05-01 | Discharge: 2011-05-01 | Disposition: A | Discharge status: Home / Self Care | Payer: Self-pay | Attending: Emergency Medicine | Admitting: Emergency Medicine

## 2011-05-01 DIAGNOSIS — I4949 Other premature depolarization: Secondary | ICD-10-CM | POA: Insufficient documentation

## 2011-05-01 DIAGNOSIS — F1010 Alcohol abuse, uncomplicated: Secondary | ICD-10-CM | POA: Insufficient documentation

## 2011-05-01 DIAGNOSIS — R9431 Abnormal electrocardiogram [ECG] [EKG]: Secondary | ICD-10-CM | POA: Insufficient documentation

## 2011-05-01 DIAGNOSIS — R072 Precordial pain: Secondary | ICD-10-CM | POA: Insufficient documentation

## 2011-05-01 DIAGNOSIS — R10816 Epigastric abdominal tenderness: Secondary | ICD-10-CM | POA: Insufficient documentation

## 2011-05-01 DIAGNOSIS — N62 Hypertrophy of breast: Secondary | ICD-10-CM | POA: Insufficient documentation

## 2011-05-01 DIAGNOSIS — IMO0001 Reserved for inherently not codable concepts without codable children: Secondary | ICD-10-CM | POA: Insufficient documentation

## 2011-05-01 DIAGNOSIS — R1013 Epigastric pain: Secondary | ICD-10-CM | POA: Insufficient documentation

## 2011-05-01 DIAGNOSIS — Z87898 Personal history of other specified conditions: Secondary | ICD-10-CM | POA: Insufficient documentation

## 2011-05-01 DIAGNOSIS — I517 Cardiomegaly: Secondary | ICD-10-CM | POA: Insufficient documentation

## 2011-05-01 LAB — CK TOTAL AND CKMB (NOT AT ARMC)
CK, MB: 1.6 ng/mL (ref 0.3–4.0)
Relative Index: 1.1 (ref 0.0–2.5)
Total CK: 140 U/L (ref 7–232)

## 2011-05-01 LAB — URINALYSIS, ROUTINE W REFLEX MICROSCOPIC
Bilirubin Urine: NEGATIVE
Nitrite: NEGATIVE
Specific Gravity, Urine: 1.008 (ref 1.005–1.030)
Urobilinogen, UA: 0.2 mg/dL (ref 0.0–1.0)
pH: 7 (ref 5.0–8.0)

## 2011-05-01 LAB — RAPID URINE DRUG SCREEN, HOSP PERFORMED
Barbiturates: NOT DETECTED
Benzodiazepines: NOT DETECTED
Cocaine: NOT DETECTED
Opiates: POSITIVE — AB

## 2011-05-01 LAB — DIFFERENTIAL
Basophils Relative: 0 % (ref 0–1)
Eosinophils Absolute: 0.1 10*3/uL (ref 0.0–0.7)
Eosinophils Relative: 1 % (ref 0–5)
Neutrophils Relative %: 59 % (ref 43–77)

## 2011-05-01 LAB — CBC
MCH: 29.5 pg (ref 26.0–34.0)
MCHC: 34.2 g/dL (ref 30.0–36.0)
MCV: 86.1 fL (ref 78.0–100.0)
Platelets: 252 10*3/uL (ref 150–400)

## 2011-05-01 LAB — COMPREHENSIVE METABOLIC PANEL
ALT: 25 U/L (ref 0–53)
Albumin: 4.3 g/dL (ref 3.5–5.2)
Alkaline Phosphatase: 58 U/L (ref 39–117)
BUN: 9 mg/dL (ref 6–23)
Calcium: 9.2 mg/dL (ref 8.4–10.5)
Potassium: 3.7 mEq/L (ref 3.5–5.1)
Sodium: 135 mEq/L (ref 135–145)
Total Protein: 8.3 g/dL (ref 6.0–8.3)

## 2011-05-01 LAB — LIPASE, BLOOD: Lipase: 17 U/L (ref 11–59)

## 2011-05-01 LAB — SALICYLATE LEVEL: Salicylate Lvl: <2 mg/dL — ABNORMAL LOW (ref 2.8–20.0)

## 2011-05-01 MED ORDER — ALBUTEROL SULFATE HFA 108 (90 BASE) MCG/ACT IN AERS
2.0000 | INHALATION_SPRAY | RESPIRATORY_TRACT | Status: DC | PRN
  Administered 2011-05-01: 2 via RESPIRATORY_TRACT
  Filled 2011-05-01: qty 6.7

## 2011-05-01 MED ORDER — MORPHINE SULFATE 4 MG/ML IJ SOLN
4.0000 mg | Freq: Once | INTRAMUSCULAR | Status: AC
  Administered 2011-05-01: 4 mg via INTRAVENOUS
  Filled 2011-05-01: qty 1

## 2011-05-01 MED ORDER — THIAMINE HCL 100 MG/ML IJ SOLN
Freq: Once | INTRAVENOUS | Status: AC
  Administered 2011-05-01: 16:00:00 via INTRAVENOUS
  Filled 2011-05-01 (×2): qty 1000

## 2011-05-01 MED ORDER — ONDANSETRON HCL 4 MG/2ML IJ SOLN
4.0000 mg | Freq: Once | INTRAMUSCULAR | Status: AC
  Administered 2011-05-01: 4 mg via INTRAVENOUS
  Filled 2011-05-01: qty 2

## 2011-05-01 NOTE — ED Notes (Signed)
Patient wanded by [facility] and nothing was found out os the ordinary.

## 2011-05-01 NOTE — ED Notes (Signed)
Pt. Stated, I've had both arm to hurt and feel weak, also i'm having some chest pain too for about 2 days.

## 2011-05-01 NOTE — ED Notes (Signed)
Dr. Davidson at bedside.

## 2011-05-01 NOTE — ED Notes (Signed)
Pt claimed that he drinks heavily everyday since he was 21 years old. Pt was attached to the monitor

## 2011-05-01 NOTE — ED Provider Notes (Signed)
History     CSN: [id number]  Arrival date & time 05/01/11  1336   First MD Initiated Contact with Patient 05/01/11 1458      Chief Complaint  Patient presents with  . Extremity Weakness  . Chest Pain    (Consider location/radiation/quality/duration/timing/severity/associated sxs/prior treatment) HPI Comments: The patient is a 21 year old man who says his stomach is hurting real bad. He feels it up into his chest. He wonders if he has a bleeding.Marland Kitchen He says he drinks liquor, beer and wine. Yesterday he had mostly liquor and wine. Today he has the chest and abdominal pain and therefore seeks evaluation. Review of his prior records shows a history of significant alcohol abuse.  Patient is a 21 y.o. male presenting with chest pain. The history is provided by the patient and medical records. No language interpreter was used.  Chest Pain The chest pain began 3 - 5 hours ago. Chest pain occurs constantly. The chest pain is unchanged. Associated with: Alcohol abuse. At its most intense, the pain is at 8/10. The quality of the pain is described as burning. Radiates to: Pain is felt in the epigastric region and in the lower chest. Exacerbated by: Nothing. Primary symptoms include abdominal pain. Pertinent negatives for primary symptoms include no nausea and no vomiting. Associated symptoms comments: He also notes pain in his upper arms. There is no history of injury.Marland Kitchen He tried nothing for the symptoms. Risk factors include alcohol intake. Past medical history comments: Alcohol abuse.     History reviewed. No pertinent past medical history.  History reviewed. No pertinent past surgical history.  No family history on file.  History  Substance Use Topics  . Smoking status: Never Smoker   . Smokeless tobacco: Not on file  . Alcohol Use: 4.2 oz/week    6 Cans of beer, 1 Shots of liquor per week      Review of Systems  Constitutional: Negative.   HENT: Negative.   Eyes: Negative.     Respiratory: Negative.   Cardiovascular: Positive for chest pain.  Gastrointestinal: Positive for abdominal pain. Negative for nausea, vomiting and blood in stool.  Genitourinary: Negative.   Musculoskeletal: Positive for myalgias.  Skin: Negative.   Neurological: Negative.   Psychiatric/Behavioral: Negative.     Allergies  Review of patient's allergies indicates no known allergies.  Home Medications  No current outpatient prescriptions on file.  BP 126/74  Pulse 73  Temp(Src) 97.1 F (36.2 C) (Oral)  Resp 16  SpO2 98%  Physical Exam  Nursing note and vitals reviewed. Constitutional: He is oriented to person, place, and time. He appears well-developed and well-nourished.       In moderate distress complaining of epigastric and lower substernal chest pain.  HENT:  Head: Normocephalic and atraumatic.  Right Ear: External ear normal.  Left Ear: External ear normal.  Mouth/Throat: Oropharynx is clear and moist.  Eyes: Conjunctivae and EOM are normal. Pupils are equal, round, and reactive to light. No scleral icterus.  Neck: Normal range of motion. Neck supple.  Cardiovascular: Normal rate, regular rhythm and normal heart sounds.   Pulmonary/Chest: Effort normal and breath sounds normal.       Gynecomastia of the left male breast, without mass or discharge.  Abdominal: Soft.       He has epigastric tenderness, no mass rebound or rigidity. He has no hepato-splenomegaly.  Musculoskeletal: Normal range of motion. He exhibits no edema.       He localizes pain to his upper  arms over the biceps muscles. There is no palpable deformity or point of tenderness. His arms don't feel or appear swollen.  Neurological: He is alert and oriented to person, place, and time.       No sensory or motor deficit.  Skin: Skin is warm and dry.  Psychiatric: He has a normal mood and affect. His behavior is normal.    ED Course  Procedures (including critical care time)   Labs Reviewed  CBC   DIFFERENTIAL  COMPREHENSIVE METABOLIC PANEL  LIPASE, BLOOD  URINALYSIS, ROUTINE W REFLEX MICROSCOPIC  URINE RAPID DRUG SCREEN (HOSP PERFORMED)  ETHANOL  ACETAMINOPHEN LEVEL  SALICYLATE LEVEL  CK TOTAL AND CKMB   3:11 PM Pt's prior charts reviewed.  Pt seen, had physical exam.  Lab workup ordered.  IV fluids with "banana bag, IV meds for pain and nausea ordered.  5:20 PM   Date: 05/01/2011  Rate: 76  Rhythm: normal sinus rhythm and premature ventricular contractions (PVC)  QRS Axis: normal  Intervals: normal QRS:  Has Q in III, unchanged from prior EKG on 03/12/2011. Left ventricular hypertrophy.  ST/T Wave abnormalities: normal  Conduction Disutrbances:none  Narrative Interpretation: Abnormal EKG.  Old EKG Reviewed: unchanged    CBC, CKMB, UA, EKG, and acute abdominal series negative. Prolonged wait for chemistries, UDS.  Pt now asymptomatic, receiving banana bag.   6:23 PM Results for orders placed during the hospital encounter of 05/01/11  CBC      Component Value Range   WBC 6.5  4.0 - 10.5 (K/uL)   RBC 4.75  4.22 - 5.81 (MIL/uL)   Hemoglobin 14.0  13.0 - 17.0 (g/dL)   HCT 65.7  84.6 - 96.2 (%)   MCV 86.1  78.0 - 100.0 (fL)   MCH 29.5  26.0 - 34.0 (pg)   MCHC 34.2  30.0 - 36.0 (g/dL)   RDW 95.2  84.1 - 32.4 (%)   Platelets 252  150 - 400 (K/uL)  DIFFERENTIAL      Component Value Range   Neutrophils Relative 59  43 - 77 (%)   Neutro Abs 3.8  1.7 - 7.7 (K/uL)   Lymphocytes Relative 32  12 - 46 (%)   Lymphs Abs 2.1  0.7 - 4.0 (K/uL)   Monocytes Relative 8  3 - 12 (%)   Monocytes Absolute 0.5  0.1 - 1.0 (K/uL)   Eosinophils Relative 1  0 - 5 (%)   Eosinophils Absolute 0.1  0.0 - 0.7 (K/uL)   Basophils Relative 0  0 - 1 (%)   Basophils Absolute 0.0  0.0 - 0.1 (K/uL)  COMPREHENSIVE METABOLIC PANEL      Component Value Range   Sodium 135  135 - 145 (mEq/L)   Potassium 3.7  3.5 - 5.1 (mEq/L)   Chloride 98  96 - 112 (mEq/L)   CO2 25  19 - 32 (mEq/L)   Glucose,  Bld 78  70 - 99 (mg/dL)   BUN 9  6 - 23 (mg/dL)   Creatinine, Ser 4.01  0.50 - 1.35 (mg/dL)   Calcium 9.2  8.4 - 02.7 (mg/dL)   Total Protein 8.3  6.0 - 8.3 (g/dL)   Albumin 4.3  3.5 - 5.2 (g/dL)   AST 33  0 - 37 (U/L)   ALT 25  0 - 53 (U/L)   Alkaline Phosphatase 58  39 - 117 (U/L)   Total Bilirubin 0.3  0.3 - 1.2 (mg/dL)   GFR calc non Af Amer >90  >  90 (mL/min)   GFR calc Af Amer >90  >90 (mL/min)  LIPASE, BLOOD      Component Value Range   Lipase 17  11 - 59 (U/L)  URINALYSIS, ROUTINE W REFLEX MICROSCOPIC      Component Value Range   Color, Urine YELLOW  YELLOW    APPearance CLEAR  CLEAR    Specific Gravity, Urine 1.008  1.005 - 1.030    pH 7.0  5.0 - 8.0    Glucose, UA NEGATIVE  NEGATIVE (mg/dL)   Hgb urine dipstick NEGATIVE  NEGATIVE    Bilirubin Urine NEGATIVE  NEGATIVE    Ketones, ur 15 (*) NEGATIVE (mg/dL)   Protein, ur NEGATIVE  NEGATIVE (mg/dL)   Urobilinogen, UA 0.2  0.0 - 1.0 (mg/dL)   Nitrite NEGATIVE  NEGATIVE    Leukocytes, UA NEGATIVE  NEGATIVE   URINE RAPID DRUG SCREEN (HOSP PERFORMED)      Component Value Range   Opiates POSITIVE (*) NONE DETECTED    Cocaine NONE DETECTED  NONE DETECTED    Benzodiazepines NONE DETECTED  NONE DETECTED    Amphetamines NONE DETECTED  NONE DETECTED    Tetrahydrocannabinol NONE DETECTED  NONE DETECTED    Barbiturates NONE DETECTED  NONE DETECTED   ETHANOL      Component Value Range   Alcohol, Ethyl (B) <11  0 - 11 (mg/dL)  ACETAMINOPHEN LEVEL      Component Value Range   Acetaminophen (Tylenol), Serum <15.0  10 - 30 (ug/mL)  SALICYLATE LEVEL      Component Value Range   Salicylate Lvl <2.0 (*) 2.8 - 20.0 (mg/dL)  CK TOTAL AND CKMB      Component Value Range   Total CK PENDING  7 - 232 (U/L)   CK, MB 1.6  0.3 - 4.0 (ng/mL)   Relative Index PENDING  0.0 - 2.5    Dg Abd Acute W/chest  05/01/2011  *RADIOLOGY REPORT*  Clinical Data: Epigastric and chest pain for 2 days.  ACUTE ABDOMEN SERIES (ABDOMEN 2 VIEW & CHEST 1 VIEW)   Comparison: None  Findings: Cardiomediastinal silhouette is within normal limits. The lungs are free of focal consolidations and pleural effusions. No free intraperitoneal air beneath the diaphragm.  Supine and erect views show a nonobstructive bowel gas pattern.  No evidence for organomegaly or abnormal calcifications. Visualized osseous structures have a normal appearance.  IMPRESSION: Negative exam.  Original Report Authenticated By: Patterson Hammersmith, M.D.    Lab workup is negative.  I advised pt to stop drinking.  He can use an albuterol inhaler if needed for shortness of breath.  I advised him he has gynecomastia of the left male breast, probably related to his drinking.   1. Alcohol abuse   2. Gynecomastia   3. History of shortness of breath          [name], MD 05/01/11 [phone]

## 2011-05-01 NOTE — ED Notes (Signed)
First meeting with patient. Patient states he is having epigastric pain and nausea x 2 days. Patient states he drinks heavily daily and has been drinking since he was 21 years of age. Patient states his last drink was at 1400 today. Patient resting with NAD at this time.

## 2011-05-01 NOTE — Discharge Instructions (Signed)
Alcohol Problems Most adults who drink alcohol drink in moderation (not a lot) are at low risk for developing problems related to their drinking. However, all drinkers, including low-risk drinkers, should know about the health risks connected with drinking alcohol. RECOMMENDATIONS FOR LOW-RISK DRINKING  Drink in moderation. Moderate drinking is defined as follows:   Men - no more than 2 drinks per day.   Nonpregnant women - no more than 1 drink per day.   Over age 97 - no more than 1 drink per day.  A standard drink is 12 grams of pure alcohol, which is equal to a 12 ounce bottle of beer or wine cooler, a 5 ounce glass of wine, or 1.5 ounces of distilled spirits (such as whiskey, brandy, vodka, or rum).  ABSTAIN FROM (DO NOT DRINK) ALCOHOL:  When pregnant or considering pregnancy.   When taking a medication that interacts with alcohol.   If you are alcohol dependent.   A medical condition that prohibits drinking alcohol (such as ulcer, liver disease, or heart disease).  DISCUSS WITH YOUR CAREGIVER:  If you are at risk for coronary heart disease, discuss the potential benefits and risks of alcohol use: Light to moderate drinking is associated with lower rates of coronary heart disease in certain populations (for example, men over age 87 and postmenopausal women). Infrequent or nondrinkers are advised not to begin light to moderate drinking to reduce the risk of coronary heart disease so as to avoid creating an alcohol-related problem. Similar protective effects can likely be gained through proper diet and exercise.   Women and the elderly have smaller amounts of body water than men. As a result women and the elderly achieve a higher blood alcohol concentration after drinking the same amount of alcohol.   Exposing a fetus to alcohol can cause a broad range of birth defects referred to as Fetal Alcohol Syndrome (FAS) or Alcohol-Related Birth Defects (ARBD). Although FAS/ARBD is connected with  excessive alcohol consumption during pregnancy, studies also have reported neurobehavioral problems in infants born to mothers reporting drinking an average of 1 drink per day during pregnancy.   Heavier drinking (the consumption of more than 4 drinks per occasion by men and more than 3 drinks per occasion by women) impairs learning (cognitive) and psychomotor functions and increases the risk of alcohol-related problems, including accidents and injuries.  CAGE QUESTIONS:   Have you ever felt that you should Cut down on your drinking?   Have people Annoyed you by criticizing your drinking?   Have you ever felt bad or Guilty about your drinking?   Have you ever had a drink first thing in the morning to steady your nerves or get rid of a hangover (Eye opener)?  If you answered positively to any of these questions: You may be at risk for alcohol-related problems if alcohol consumption is:   Men: Greater than 14 drinks per week or more than 4 drinks per occasion.   Women: Greater than 7 drinks per week or more than 3 drinks per occasion.  Do you or your family have a medical history of alcohol-related problems, such as:  Blackouts.   Sexual dysfunction.   Depression.   Trauma.   Liver dysfunction.   Sleep disorders.   Hypertension.   Chronic abdominal pain.   Has your drinking ever caused you problems, such as problems with your family, problems with your work (or school) performance, or accidents/injuries?   Do you have a compulsion to drink or a preoccupation  Do you have a compulsion to drink or a preoccupation with drinking?   Do you have poor control or are you unable to stop drinking once you have started?   Do you have to drink to avoid withdrawal symptoms?   Do you have problems with withdrawal such as tremors, nausea, sweats, or mood disturbances?   Does it take more alcohol than in the past to get you high?   Do you feel a strong urge to drink?   Do you change your plans so that you can have a drink?   Do you ever drink in the morning to relieve  the shakes or a hangover?  If you have answered a number of the previous questions positively, it may be time for you to talk to your caregivers, family, and friends and see if they think you have a problem. Alcoholism is a chemical dependency that keeps getting worse and will eventually destroy your health and relationships. Many alcoholics end up dead, impoverished, or in prison. This is often the end result of all chemical dependency.   Do not be discouraged if you are not ready to take action immediately.   Decisions to change behavior often involve up and down desires to change and feeling like you cannot decide.   Try to think more seriously about your drinking behavior.   Think of the reasons to quit.  WHERE TO GO FOR ADDITIONAL INFORMATION    The National Institute on Alcohol Abuse and Alcoholism (NIAAA)www.niaaa.nih.gov   National Council on Alcoholism and Drug Dependence (NCADD)www.ncadd.org   American Society of Addiction Medicine (ASAM)www.asam.org  Document Released: 12/28/2004 Document Revised: 12/17/2010 Document Reviewed: 08/16/2007  Exit[employer] Patient Information 2012 [employer].

## 2012-01-09 ENCOUNTER — Emergency Department (HOSPITAL_COMMUNITY)
Admission: EM | Admit: 2012-01-09 | Discharge: 2012-01-09 | Discharge status: Against Medical Advice | Payer: No Typology Code available for payment source | Attending: Emergency Medicine | Admitting: Emergency Medicine

## 2012-01-09 ENCOUNTER — Encounter (HOSPITAL_COMMUNITY): Payer: Self-pay | Admitting: Family Medicine

## 2012-01-09 DIAGNOSIS — Y92410 Unspecified street and highway as the place of occurrence of the external cause: Secondary | ICD-10-CM | POA: Insufficient documentation

## 2012-01-09 DIAGNOSIS — T07XXXA Unspecified multiple injuries, initial encounter: Secondary | ICD-10-CM | POA: Insufficient documentation

## 2012-01-09 DIAGNOSIS — Y9389 Activity, other specified: Secondary | ICD-10-CM | POA: Insufficient documentation
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
## 2012-01-19 ENCOUNTER — Emergency Department (HOSPITAL_COMMUNITY): Payer: Self-pay

## 2012-01-19 ENCOUNTER — Emergency Department (HOSPITAL_COMMUNITY)
Admission: EM | Admit: 2012-01-19 | Discharge: 2012-01-19 | Disposition: A | Discharge status: Home / Self Care | Payer: Self-pay | Attending: Emergency Medicine | Admitting: Emergency Medicine

## 2012-01-19 ENCOUNTER — Encounter (HOSPITAL_COMMUNITY): Payer: Self-pay | Admitting: *Deleted

## 2012-01-19 DIAGNOSIS — R05 Cough: Secondary | ICD-10-CM | POA: Insufficient documentation

## 2012-01-19 DIAGNOSIS — R7402 Elevation of levels of lactic acid dehydrogenase (LDH): Secondary | ICD-10-CM | POA: Insufficient documentation

## 2012-01-19 DIAGNOSIS — R0602 Shortness of breath: Secondary | ICD-10-CM | POA: Insufficient documentation

## 2012-01-19 DIAGNOSIS — IMO0001 Reserved for inherently not codable concepts without codable children: Secondary | ICD-10-CM | POA: Insufficient documentation

## 2012-01-19 DIAGNOSIS — R7401 Elevation of levels of liver transaminase levels: Secondary | ICD-10-CM | POA: Insufficient documentation

## 2012-01-19 DIAGNOSIS — R059 Cough, unspecified: Secondary | ICD-10-CM | POA: Insufficient documentation

## 2012-01-19 DIAGNOSIS — F1010 Alcohol abuse, uncomplicated: Secondary | ICD-10-CM | POA: Insufficient documentation

## 2012-01-19 DIAGNOSIS — R079 Chest pain, unspecified: Secondary | ICD-10-CM | POA: Insufficient documentation

## 2012-01-19 DIAGNOSIS — F419 Anxiety disorder, unspecified: Secondary | ICD-10-CM

## 2012-01-19 DIAGNOSIS — F411 Generalized anxiety disorder: Secondary | ICD-10-CM | POA: Insufficient documentation

## 2012-01-19 LAB — COMPREHENSIVE METABOLIC PANEL
ALT: 92 U/L — ABNORMAL HIGH (ref 0–53)
AST: 81 U/L — ABNORMAL HIGH (ref 0–37)
Alkaline Phosphatase: 59 U/L (ref 39–117)
CO2: 27 mEq/L (ref 19–32)
Calcium: 9.8 mg/dL (ref 8.4–10.5)
GFR calc Af Amer: >90 mL/min (ref >90)
GFR calc non Af Amer: >90 mL/min (ref >90)
Glucose, Bld: 86 mg/dL (ref 70–99)
Potassium: 4 mEq/L (ref 3.5–5.1)
Sodium: 138 mEq/L (ref 135–145)

## 2012-01-19 LAB — CBC WITH DIFFERENTIAL/PLATELET
Basophils Absolute: 0 10*3/uL (ref 0.0–0.1)
Eosinophils Relative: 3 % (ref 0–5)
Lymphocytes Relative: 32 % (ref 12–46)
Lymphs Abs: 1.5 10*3/uL (ref 0.7–4.0)
Neutro Abs: 2.5 10*3/uL (ref 1.7–7.7)
Neutrophils Relative %: 53 % (ref 43–77)
Platelets: 223 10*3/uL (ref 150–400)
RBC: 4.62 MIL/uL (ref 4.22–5.81)
RDW: 12.8 % (ref 11.5–15.5)
WBC: 4.8 10*3/uL (ref 4.0–10.5)

## 2012-01-19 MED ORDER — TRAMADOL HCL 50 MG PO TABS: 50.0000 mg | ORAL_TABLET | Freq: Four times a day (QID) | ORAL | Status: DC | PRN

## 2012-01-19 MED ORDER — HYDROXYZINE HCL 25 MG PO TABS: 25.0000 mg | ORAL_TABLET | Freq: Four times a day (QID) | ORAL | Status: DC

## 2012-01-19 NOTE — ED Provider Notes (Signed)
History     CSN: [id number]  Arrival date & time 01/19/12  4098   First MD Initiated Contact with Patient 01/19/12 1005      Chief Complaint  Patient presents with  . Cough  . Generalized Body Aches    (Consider location/radiation/quality/duration/timing/severity/associated sxs/prior treatment) HPI  Thomas Vaughn is a 22 y.o. male complaining of myalgia, chest pain, shortness of breath, dry cough and seeing spots when he turns his head quickly for the last 2 days. She also reports that he binge during this: He drinks daily at least 240 ounces of beer. He is interested in alcohol detox but says he cannot go today because he has a court appointment. Patient denies fever,  abdominal pain, nausea vomiting diarrhea  History reviewed. No pertinent past medical history.  History reviewed. No pertinent past surgical history.  History reviewed. No pertinent family history.  History  Substance Use Topics  . Smoking status: Never Smoker   . Smokeless tobacco: Not on file  . Alcohol Use: 4.2 oz/week    6 Cans of beer, 1 Shots of liquor per week     Comment: reports binge drinking      Review of Systems  Constitutional: Negative for fever.  Respiratory: Positive for cough and shortness of breath.   Cardiovascular: Positive for chest pain.  Gastrointestinal: Negative for nausea, vomiting, abdominal pain and diarrhea.  Psychiatric/Behavioral: The patient is nervous/anxious.   All other systems reviewed and are negative.    Allergies  Review of patient's allergies indicates no known allergies.  Home Medications  No current outpatient prescriptions on file.  BP 127/81  Pulse 84  Temp 98.2 F (36.8 C) (Oral)  Resp 18  SpO2 98%  Physical Exam  Nursing note and vitals reviewed. Constitutional: He is oriented to person, place, and time. He appears well-developed and well-nourished. No distress.  HENT:  Head: Normocephalic.  Mouth/Throat: Oropharynx is clear and moist.    Eyes: Conjunctivae normal and EOM are normal. Pupils are equal, round, and reactive to light.  Neck: Normal range of motion.  Cardiovascular: Normal rate, regular rhythm and intact distal pulses.   Pulmonary/Chest: Effort normal and breath sounds normal. No stridor. No respiratory distress. He has no wheezes. He has no rales. He exhibits no tenderness.  Abdominal: Soft. Bowel sounds are normal. He exhibits no distension and no mass. There is no tenderness. There is no rebound and no guarding.  Musculoskeletal: Normal range of motion.  Neurological: He is alert and oriented to person, place, and time.  Psychiatric: He has a normal mood and affect.       Patient is very agitated, says that he thinks he's dying and he cannot breathe.     ED Course  Procedures (including critical care time)  Labs Reviewed  COMPREHENSIVE METABOLIC PANEL - Abnormal; Notable for the following:    AST 81 (*)     ALT 92 (*)     All other components within normal limits  CBC WITH DIFFERENTIAL   Dg Chest 2 View  01/19/2012  *RADIOLOGY REPORT*  Clinical Data: Cough  CHEST - 2 VIEW  Comparison: 10/05/2010  Findings: Cardiomediastinal silhouette is within normal limits. No pneumothorax.  No consolidation.  No mass.  IMPRESSION: No active cardiopulmonary disease.   Original Report Authenticated By: Jolaine Click, M.D.      1. Influenza-like illness   2. Alcohol abuse   3. Anxiety       MDM  Stable vital signs, history of  present illness and physical exam does not lead me to suspect any acute process.  Offered patient alcohol detoxification however he declines at this time stating that he has to go to court this afternoon. We'll give him outpatient detox resources.  Blood work ordered at patient's request he is very nervous about if he has any liver damage from his treating.  Patient has a mild transaminitis consistent with ethanol abuse. Discussed results with patient and advised him to research  detoxification and cut down drinking when he is committed to doing so.   Pt verbalized understanding and agrees with care plan. Outpatient follow-up and return precautions given.          Wynetta Emery, PA-C 01/19/12 1158  Eleanor Gatliff, PA-C 01/19/12 1231

## 2012-01-19 NOTE — ED Notes (Addendum)
Pt has multiple complaints. Reports body aches, cough, "seeing spots", dizziness for several weeks. No distress noted at triage.mask on pt.

## 2012-01-19 NOTE — ED Provider Notes (Signed)
Medical screening examination/treatment/procedure(s) were performed by non-physician practitioner and as supervising physician I was immediately available for consultation/collaboration.  Gilda Crease, MD 01/19/12 [phone]

## 2012-01-28 ENCOUNTER — Encounter (HOSPITAL_COMMUNITY): Payer: Self-pay | Admitting: Emergency Medicine

## 2012-01-28 ENCOUNTER — Emergency Department (HOSPITAL_COMMUNITY): Payer: Self-pay

## 2012-01-28 ENCOUNTER — Emergency Department (HOSPITAL_COMMUNITY)
Admission: EM | Admit: 2012-01-28 | Discharge: 2012-01-28 | Disposition: A | Discharge status: Home / Self Care | Payer: Self-pay | Attending: Emergency Medicine | Admitting: Emergency Medicine

## 2012-01-28 DIAGNOSIS — Z79899 Other long term (current) drug therapy: Secondary | ICD-10-CM | POA: Insufficient documentation

## 2012-01-28 DIAGNOSIS — F909 Attention-deficit hyperactivity disorder, unspecified type: Secondary | ICD-10-CM | POA: Insufficient documentation

## 2012-01-28 DIAGNOSIS — R0602 Shortness of breath: Secondary | ICD-10-CM | POA: Insufficient documentation

## 2012-01-28 DIAGNOSIS — F1010 Alcohol abuse, uncomplicated: Secondary | ICD-10-CM | POA: Insufficient documentation

## 2012-01-28 DIAGNOSIS — R61 Generalized hyperhidrosis: Secondary | ICD-10-CM | POA: Insufficient documentation

## 2012-01-28 DIAGNOSIS — R0789 Other chest pain: Secondary | ICD-10-CM | POA: Insufficient documentation

## 2012-01-28 DIAGNOSIS — F411 Generalized anxiety disorder: Secondary | ICD-10-CM | POA: Insufficient documentation

## 2012-01-28 DIAGNOSIS — R079 Chest pain, unspecified: Secondary | ICD-10-CM

## 2012-01-28 DIAGNOSIS — F419 Anxiety disorder, unspecified: Secondary | ICD-10-CM

## 2012-01-28 DIAGNOSIS — T887XXA Unspecified adverse effect of drug or medicament, initial encounter: Secondary | ICD-10-CM

## 2012-01-28 DIAGNOSIS — Z8659 Personal history of other mental and behavioral disorders: Secondary | ICD-10-CM | POA: Insufficient documentation

## 2012-01-28 HISTORY — DX: Attention-deficit hyperactivity disorder, unspecified type: F90.9

## 2012-01-28 LAB — CBC WITH DIFFERENTIAL/PLATELET
Basophils Relative: 0 % (ref 0–1)
Eosinophils Absolute: 0.3 10*3/uL (ref 0.0–0.7)
Eosinophils Relative: 7 % — ABNORMAL HIGH (ref 0–5)
HCT: 39.4 % (ref 39.0–52.0)
Lymphocytes Relative: 31 % (ref 12–46)
Lymphs Abs: 1.3 10*3/uL (ref 0.7–4.0)
Monocytes Absolute: 0.3 10*3/uL (ref 0.1–1.0)
Neutro Abs: 2.2 10*3/uL (ref 1.7–7.7)
Neutrophils Relative %: 55 % (ref 43–77)
Platelets: 260 10*3/uL (ref 150–400)
RDW: 12.8 % (ref 11.5–15.5)
WBC: 4.1 10*3/uL (ref 4.0–10.5)

## 2012-01-28 LAB — BASIC METABOLIC PANEL
BUN: 6 mg/dL (ref 6–23)
CO2: 27 mEq/L (ref 19–32)
Chloride: 101 mEq/L (ref 96–112)
Creatinine, Ser: 0.83 mg/dL (ref 0.50–1.35)
Glucose, Bld: 96 mg/dL (ref 70–99)

## 2012-01-28 LAB — POCT I-STAT TROPONIN I: Troponin i, poc: 0 ng/mL (ref 0.00–0.08)

## 2012-01-28 MED ORDER — LORAZEPAM 0.5 MG PO TABS
0.5000 mg | ORAL_TABLET | Freq: Once | ORAL | Status: AC
  Administered 2012-01-28: 0.5 mg via ORAL
  Filled 2012-01-28: qty 1

## 2012-01-28 MED ORDER — LORAZEPAM 1 MG PO TABS: 0.5000 mg | ORAL_TABLET | Freq: Every day | ORAL | Status: DC

## 2012-01-28 NOTE — ED Notes (Signed)
Pt c/o mid sternal CP x several days; pt sts stopped drinking 2 days ago

## 2012-01-28 NOTE — ED Provider Notes (Signed)
History   This chart was scribed for non-physician practitioner working with Thomas Cooper III, MD by Thomas Vaughn, ED Scribe. This patient was seen in room TR10C/TR10C and the patient's care was started at 1626.   CSN: [id number]  Arrival date & time 01/28/12  1247   First MD Initiated Contact with Patient 01/28/12 1626      Chief Complaint  Patient presents with  . Chest Pain    (Consider location/radiation/quality/duration/timing/severity/associated sxs/prior treatment) HPI  Thomas Vaughn is a 22 y.o. male who presents to the Emergency Department complaining of sharp, 5-6/10, intermittent, sudden onset, chest pain with associated SOB, palpitations, tachypnea and diaphoresis that began 2 days ago. He reports that he was seen in the ED on 01/08 and given a prescription of Ultram and Vistaril. He states he took the Vistaril for the first time yesterday and then had an onset of anxiety, restlessness and chest pain.  He also sees a sweaty palms with these episodes.  He denies any h/o of MI or family h/o of MI, but has a h/o of similar pain. He reports that he drinks 3 to 4 40 oz beers a day. He denies any nausea, emesis, cough, congestion, fever, visual disturbance, extremity pain, or abdominal pain. He states that the pain is not aggravated by anything, but is improved by drinking beer. Patient has been offered AA or detox before and has declined.   Past Medical History  Diagnosis Date  . ADHD (attention deficit hyperactivity disorder)     History reviewed. No pertinent past surgical history.  History reviewed. No pertinent family history.  History  Substance Use Topics  . Smoking status: Never Smoker   . Smokeless tobacco: Not on file  . Alcohol Use: 4.2 oz/week    6 Cans of beer, 1 Shots of liquor per week     Comment: reports binge drinking      Review of Systems  Constitutional: Positive for diaphoresis. Negative for fever, appetite change, fatigue and unexpected  weight change.  HENT: Negative for mouth sores and neck stiffness.   Eyes: Negative for visual disturbance.  Respiratory: Positive for shortness of breath. Negative for cough, chest tightness and wheezing.   Cardiovascular: Positive for chest pain.  Gastrointestinal: Negative for nausea, vomiting, abdominal pain, diarrhea and constipation.  Genitourinary: Negative for dysuria, urgency, frequency and hematuria.  Musculoskeletal: Negative for back pain.  Skin: Negative for rash.  Neurological: Negative for syncope, light-headedness and headaches.  Hematological: Does not bruise/bleed easily.  Psychiatric/Behavioral: Negative for sleep disturbance. The patient is not nervous/anxious.   All other systems reviewed and are negative.    Allergies  Review of patient's allergies indicates no known allergies.  Home Medications   Current Outpatient Rx  Name  Route  Sig  Dispense  Refill  . TRAMADOL HCL 50 MG PO TABS   Oral   Take 50 mg by mouth every 6 (six) hours as needed. For pain         . LORAZEPAM 1 MG PO TABS   Oral   Take 0.5 tablets (0.5 mg total) by mouth daily.   10 tablet   0     BP 130/83  Pulse 50  Temp 98.5 F (36.9 C) (Oral)  Resp 18  SpO2 99%  Physical Exam  Nursing note and vitals reviewed. Constitutional: He is oriented to person, place, and time. He appears well-developed and well-nourished. No distress.  HENT:  Head: Normocephalic and atraumatic.  Right Ear: External ear normal.  Left Ear: External ear normal.  Nose: Nose normal.  Mouth/Throat: Oropharynx is clear and moist. No oropharyngeal exudate.  Eyes: Conjunctivae normal and EOM are normal. Pupils are equal, round, and reactive to light. No scleral icterus.  Neck: Normal range of motion. Neck supple.  Cardiovascular: Normal rate, regular rhythm, normal heart sounds and intact distal pulses.  Exam reveals no gallop and no friction rub.   No murmur heard. Pulmonary/Chest: Effort normal and breath  sounds normal. No respiratory distress. He has no wheezes. He has no rales. He exhibits tenderness.       He has mild tenderness to sternum with palpation.  Abdominal: Soft. Bowel sounds are normal. He exhibits no distension and no mass. There is no tenderness. There is no rebound and no guarding.  Musculoskeletal: Normal range of motion. He exhibits no edema and no tenderness.  Lymphadenopathy:    He has no cervical adenopathy.  Neurological: He is alert and oriented to person, place, and time. He exhibits normal muscle tone. Coordination normal.       Speech is clear and goal oriented Moves extremities without ataxia  Skin: Skin is warm and dry. No rash noted. He is not diaphoretic. No erythema.  Psychiatric: His mood appears anxious. He is is hyperactive. He does not exhibit a depressed mood. He expresses no homicidal and no suicidal ideation.    ED Course  Procedures (including critical care time)  DIAGNOSTIC STUDIES: Oxygen Saturation is 99% on room air, normal by my interpretation.    COORDINATION OF CARE:  16:38- Discussed planned course of treatment with the patient, including discontinuing the Vistaril who is agreeable at this time.  17:30- Medication Orders- Lorazepam (atrivan) tablet 0.5 mg- once.  7:24 PM  - patient with complete resolution of chest pain diaphoresis shortness of breath and 20 palms after administration of Ativan.    Results for orders placed during the hospital encounter of 01/28/12  CBC WITH DIFFERENTIAL      Component Value Range   WBC 4.1  4.0 - 10.5 K/uL   RBC 4.58  4.22 - 5.81 MIL/uL   Hemoglobin 13.6  13.0 - 17.0 g/dL   HCT 21.3  08.6 - 57.8 %   MCV 86.0  78.0 - 100.0 fL   MCH 29.7  26.0 - 34.0 pg   MCHC 34.5  30.0 - 36.0 g/dL   RDW 46.9  62.9 - 52.8 %   Platelets 260  150 - 400 K/uL   Neutrophils Relative 55  43 - 77 %   Lymphocytes Relative 31  12 - 46 %   Monocytes Relative 7  3 - 12 %   Eosinophils Relative 7 (*) 0 - 5 %   Basophils  Relative 0  0 - 1 %   Neutro Abs 2.2  1.7 - 7.7 K/uL   Lymphs Abs 1.3  0.7 - 4.0 K/uL   Monocytes Absolute 0.3  0.1 - 1.0 K/uL   Eosinophils Absolute 0.3  0.0 - 0.7 K/uL   Basophils Absolute 0.0  0.0 - 0.1 K/uL   RBC Morphology TARGET CELLS     WBC Morphology ATYPICAL LYMPHOCYTES    BASIC METABOLIC PANEL      Component Value Range   Sodium 139  135 - 145 mEq/L   Potassium 3.6  3.5 - 5.1 mEq/L   Chloride 101  96 - 112 mEq/L   CO2 27  19 - 32 mEq/L   Glucose, Bld 96  70 - 99 mg/dL  BUN 6  6 - 23 mg/dL   Creatinine, Ser 4.09  0.50 - 1.35 mg/dL   Calcium 9.6  8.4 - 81.1 mg/dL   GFR calc non Af Amer >90  >90 mL/min   GFR calc Af Amer >90  >90 mL/min  POCT I-STAT TROPONIN I      Component Value Range   Troponin i, poc 0.00  0.00 - 0.08 ng/mL   Comment 3             Labs Reviewed  CBC WITH DIFFERENTIAL - Abnormal; Notable for the following:    Eosinophils Relative 7 (*)     All other components within normal limits  BASIC METABOLIC PANEL  POCT I-STAT TROPONIN I   Dg Chest 2 View  01/28/2012  *RADIOLOGY REPORT*  Clinical Data: Left chest pain  CHEST - 2 VIEW  Comparison: Chest radiograph 01/19/2012  Findings: The heart, mediastinal, and hilar contours are normal. The lungs are well-expanded and clear. Negative for pleural effusion or pneumothorax. The bony thorax is unremarkable.  IMPRESSION: No acute cardiopulmonary disease.   Original Report Authenticated By: Britta Mccreedy, M.D.      1. Chest pain   2. Anxiety   3. ETOH abuse   4. Non-dose-related adverse reaction to medication       MDM  Stalin L Johnson presents with chest pain in anxiety. Patient is to be discharged with recommendation to follow up with PCP in regards to today's hospital visit. he's also been advised to stop taking the Vistaril.   Chest pain is not likely of cardiac or pulmonary etiology d/t presentation, perc negative, VSS, no tracheal deviation, no JVD or new murmur, RRR, breath sounds equal  bilaterally, EKG without acute abnormalities, negative troponin, and negative CXR.  chest pain likely secondary to anxiety as patient states that his symptoms abate when he drink alcohol. Pt has been advised start a PPI and return to the ED is CP becomes exertional, associated with diaphoresis or nausea, radiates to left jaw/arm, worsens or becomes concerning in any way. Pt appears reliable for follow up and is agreeable to discharge. Will write for Ativan 0.5 mg daily as needed for anxiety. Patient has been offered detox or inpatient therapy for which he has refused stating that he must go home because house arrest bracelet we'll go off. Strongly recommended consult with social work and followup with AA.  I also recommend that he find a primary care physician.   1. Medications: ativan 0.5mg  once per day as needed for, usual home medications  2. Treatment: rest, drink plenty of fluids, take medication as prescribed, do not drink alcohol with this medication, find an AA meeting  3. Follow Up: Please followup with your primary doctor for discussion of your diagnoses and further evaluation after today's visit; if you do not have a primary care doctor use the resource guide provided to find one; the emergency department as needed   I personally performed the services described in this documentation, which was scribed in my presence. The recorded information has been reviewed and is accurate.        Dierdre Forth, PA-C 01/28/12 [id number]

## 2012-01-29 NOTE — ED Provider Notes (Signed)
Medical screening examination/treatment/procedure(s) were performed by non-physician practitioner and as supervising physician I was immediately available for consultation/collaboration.   Lelend Heinecke III, MD 01/29/12 1158 

## 2012-04-14 ENCOUNTER — Encounter (HOSPITAL_COMMUNITY): Payer: Self-pay | Admitting: *Deleted

## 2012-04-14 ENCOUNTER — Emergency Department (INDEPENDENT_AMBULATORY_CARE_PROVIDER_SITE_OTHER)
Admission: EM | Admit: 2012-04-14 | Discharge: 2012-04-14 | Disposition: A | Discharge status: Home / Self Care | Payer: Self-pay | Source: Home / Self Care | Attending: Family Medicine | Admitting: Family Medicine

## 2012-04-14 DIAGNOSIS — K2970 Gastritis, unspecified, without bleeding: Secondary | ICD-10-CM

## 2012-04-14 DIAGNOSIS — R51 Headache: Secondary | ICD-10-CM

## 2012-04-14 DIAGNOSIS — K2990 Gastroduodenitis, unspecified, without bleeding: Secondary | ICD-10-CM

## 2012-04-14 LAB — POCT I-STAT, CHEM 8
BUN: 7 mg/dL (ref 6–23)
Chloride: 102 mEq/L (ref 96–112)
Glucose, Bld: 87 mg/dL (ref 70–99)
HCT: 46 % (ref 39.0–52.0)
Potassium: 4.1 mEq/L (ref 3.5–5.1)

## 2012-04-14 MED ORDER — HYDROCODONE-ACETAMINOPHEN 5-325 MG PO TABS
ORAL_TABLET | ORAL | Status: AC
  Filled 2012-04-14: qty 1

## 2012-04-14 MED ORDER — HYDROCODONE-ACETAMINOPHEN 5-325 MG PO TABS
1.0000 | ORAL_TABLET | Freq: Once | ORAL | Status: AC
  Administered 2012-04-14: 1 via ORAL

## 2012-04-14 MED ORDER — OMEPRAZOLE 20 MG PO CPDR: 20.0000 mg | DELAYED_RELEASE_CAPSULE | Freq: Every day | ORAL | Status: DC

## 2012-04-14 MED ORDER — TRAMADOL HCL 50 MG PO TABS: 50.0000 mg | ORAL_TABLET | Freq: Four times a day (QID) | ORAL | Status: DC | PRN

## 2012-04-14 MED ORDER — PROMETHAZINE HCL 12.5 MG PO TABS: 12.5000 mg | ORAL_TABLET | Freq: Three times a day (TID) | ORAL | Status: DC | PRN

## 2012-04-14 NOTE — ED Notes (Signed)
Pt  Reports  Symptoms  Of  A  Headache   Not  releived  By otc   meds      Symptoms  X  3  Days      Vomited  X  1  Today       No  Photophobia        No  History  Of   Headaches  Like  This  -  He  Is  Awake  As  Well  As  Alert  /  Oriented

## 2012-04-14 NOTE — ED Provider Notes (Signed)
History     CSN: [id number]  Arrival date & time 04/14/12  1107   First MD Initiated Contact with Patient 04/14/12 1308      Chief Complaint  Patient presents with  . Headache    (Consider location/radiation/quality/duration/timing/severity/associated sxs/prior treatment) HPI Comments: 22 y/o male comes c/o occipital headache associated with epigastric pain, nausea and 1 episode of emesis. Also reports loose stools but no diarrhea. Denies hematemesis or melena. Patient admits to heavy drinking and marijuana use. Last alcohol intake was last night. Reports feels hungry but eating poorly has tolerated solids and fluid intake today with no vomiting. Admits to acid reflux.  No vomiting today. Denies fever or chills. No visual or balance problems. No extremity weakness. States his abdominal pain is not bothering him today but mostly the headache.    Past Medical History  Diagnosis Date  . ADHD (attention deficit hyperactivity disorder)     History reviewed. No pertinent past surgical history.  No family history on file.  History  Substance Use Topics  . Smoking status: Never Smoker   . Smokeless tobacco: Not on file  . Alcohol Use: 4.2 oz/week    6 Cans of beer, 1 Shots of liquor per week     Comment: reports binge drinking      Review of Systems  Constitutional: Negative for fever, chills, diaphoresis and fatigue.  HENT: Negative for sore throat.   Respiratory: Negative for cough and shortness of breath.   Cardiovascular: Negative for chest pain.  Gastrointestinal: Positive for nausea, vomiting and abdominal pain. Negative for diarrhea and blood in stool.  Musculoskeletal: Negative for myalgias and arthralgias.  Skin: Negative for color change and rash.  Neurological: Positive for headaches. Negative for dizziness, tremors, seizures, syncope, speech difficulty, weakness and numbness.  All other systems reviewed and are negative.    Allergies  Review of patient's  allergies indicates no known allergies.  Home Medications   Current Outpatient Rx  Name  Route  Sig  Dispense  Refill  . LORazepam (ATIVAN) 1 MG tablet   Oral   Take 0.5 tablets (0.5 mg total) by mouth daily.   10 tablet   0   . omeprazole (PRILOSEC) 20 MG capsule   Oral   Take 1 capsule (20 mg total) by mouth daily.   30 capsule   0   . EXPIRED: promethazine (PHENERGAN) 12.5 MG tablet   Oral   Take 1 tablet (12.5 mg total) by mouth every 6 (six) hours as needed for nausea.   12 tablet   0   . promethazine (PHENERGAN) 12.5 MG tablet   Oral   Take 1 tablet (12.5 mg total) by mouth every 8 (eight) hours as needed for nausea.   30 tablet   0   . traMADol (ULTRAM) 50 MG tablet   Oral   Take 1 tablet (50 mg total) by mouth every 6 (six) hours as needed for pain.   15 tablet   0     BP 144/87  Pulse 65  Temp(Src) 98.3 F (36.8 C) (Oral)  Resp 16  SpO2 100%  Physical Exam  Nursing note and vitals reviewed. Constitutional: He is oriented to person, place, and time. He appears well-developed and well-nourished. No distress.  HENT:  Head: Normocephalic and atraumatic.  Right Ear: External ear normal.  Left Ear: External ear normal.  Nose: Nose normal.  Mouth/Throat: Oropharynx is clear and moist. No oropharyngeal exudate.  Eyes: Conjunctivae and EOM are normal. Pupils  are equal, round, and reactive to light. No scleral icterus.  Neck: Neck supple. No JVD present. No thyromegaly present.  Cardiovascular: Normal rate, regular rhythm and normal heart sounds.  Exam reveals no gallop and no friction rub.   No murmur heard. Pulmonary/Chest: Effort normal and breath sounds normal. No respiratory distress. He has no wheezes. He has no rales. He exhibits no tenderness.  Abdominal: Soft. Bowel sounds are normal. He exhibits no distension and no mass. There is no rebound and no guarding.  Mild discomfort in epigastric area with deep palpation.  Lymphadenopathy:    He has no  cervical adenopathy.  Neurological: He is alert and oriented to person, place, and time. He has normal strength and normal reflexes. No cranial nerve deficit or sensory deficit. He displays a negative Romberg sign. Coordination and gait normal.  No face drop. No arm drop.  Visual fields normal by comparison.  Skin: No rash noted. He is not diaphoretic.    ED Course  Procedures (including critical care time)  Labs Reviewed  POCT I-STAT, CHEM 8 - Abnormal; Notable for the following:    Calcium, Ion 1.24 (*)    All other components within normal limits   No results found.   1. Headache   2. Gastritis       MDM  Reassuring exam including abdomen and neuro. Normal electrolytes. Impress symptoms related to recurrent alcohol intake. Counseled appropriately. Prescribed omeprazole, promethazine and tramadol. Supportive care and red flags that should prompt return to medical attention discussed with patient and provided in writing.          Sharin Grave, MD 04/16/12 4098

## 2012-04-17 ENCOUNTER — Encounter (HOSPITAL_COMMUNITY): Payer: Self-pay | Admitting: Emergency Medicine

## 2012-04-17 ENCOUNTER — Emergency Department (HOSPITAL_COMMUNITY): Payer: Self-pay

## 2012-04-17 ENCOUNTER — Emergency Department (HOSPITAL_COMMUNITY)
Admission: EM | Admit: 2012-04-17 | Discharge: 2012-04-17 | Disposition: A | Discharge status: Home / Self Care | Payer: Self-pay | Attending: Emergency Medicine | Admitting: Emergency Medicine

## 2012-04-17 DIAGNOSIS — Z8659 Personal history of other mental and behavioral disorders: Secondary | ICD-10-CM | POA: Insufficient documentation

## 2012-04-17 DIAGNOSIS — R51 Headache: Secondary | ICD-10-CM | POA: Insufficient documentation

## 2012-04-17 MED ORDER — KETOROLAC TROMETHAMINE 60 MG/2ML IM SOLN
60.0000 mg | Freq: Once | INTRAMUSCULAR | Status: AC
  Administered 2012-04-17: 60 mg via INTRAMUSCULAR
  Filled 2012-04-17: qty 2

## 2012-04-17 MED ORDER — LORAZEPAM 1 MG PO TABS
1.0000 mg | ORAL_TABLET | Freq: Once | ORAL | Status: AC
  Administered 2012-04-17: 1 mg via ORAL
  Filled 2012-04-17: qty 1

## 2012-04-17 NOTE — ED Notes (Signed)
Patient complains of headache x 5 days.  Patient claims that the area of pain moves around his head.  Patient went to urgent care on Friday, and was prescribed ultram, but advised that it does not work for him.

## 2012-04-17 NOTE — ED Provider Notes (Signed)
History     CSN: [id number]  Arrival date & time 04/17/12  4098   First MD Initiated Contact with Patient 04/17/12 [phone]      Chief Complaint  Patient presents with  . Headache    (Consider location/radiation/quality/duration/timing/severity/associated sxs/prior treatment) HPI Comments: Thomas Vaughn is a 22 y.o. Male who presents complaining of a headache on and off for 5 days. It is felt in the occiput. He was seen at the urgent care clinic and diagnosed with nonspecific headache and was treated with ULTRAM. He states that it does not help. He denies fever, chills, nausea, vomiting, weakness, or dizziness. There's been no blurred vision, cough, shortness of breath, or chest pain. He has also tried Excedrin, for the pain, without relief. ere are no other modifying factors.  Patient is a 22 y.o. male presenting with headaches. The history is provided by the patient.  Headache   Past Medical History  Diagnosis Date  . ADHD (attention deficit hyperactivity disorder)     History reviewed. No pertinent past surgical history.  No family history on file.  History  Substance Use Topics  . Smoking status: Never Smoker   . Smokeless tobacco: Not on file  . Alcohol Use: 4.2 oz/week    6 Cans of beer, 1 Shots of liquor per week     Comment: reports binge drinking      Review of Systems  Neurological: Positive for headaches.  All other systems reviewed and are negative.    Allergies  Review of patient's allergies indicates no known allergies.  Home Medications   Current Outpatient Rx  Name  Route  Sig  Dispense  Refill  . aspirin-acetaminophen-caffeine (EXCEDRIN MIGRAINE) 250-250-65 MG per tablet   Oral   Take 1 tablet by mouth daily.         . traMADol (ULTRAM) 50 MG tablet   Oral   Take 50 mg by mouth every 6 (six) hours as needed for pain.           BP 142/97  Pulse 58  Temp(Src) 97 F (36.1 C) (Oral)  Resp 18  Ht 5\' 8"  (1.727 m)  Wt 160 lb (72.576  kg)  BMI 24.33 kg/m2  SpO2 100%  Physical Exam  Nursing note and vitals reviewed. Constitutional: He is oriented to person, place, and time. He appears well-developed and well-nourished. No distress.  HENT:  Head: Normocephalic and atraumatic.  Right Ear: External ear normal.  Left Ear: External ear normal.  Eyes: Conjunctivae and EOM are normal. Pupils are equal, round, and reactive to light.  Neck: Normal range of motion and phonation normal. Neck supple.  Cardiovascular: Normal rate, regular rhythm, normal heart sounds and intact distal pulses.   Pulmonary/Chest: Effort normal and breath sounds normal. He exhibits no bony tenderness.  Abdominal: Soft. Normal appearance. There is no tenderness.  Musculoskeletal: Normal range of motion.  Neurological: He is alert and oriented to person, place, and time. He has normal strength. No cranial nerve deficit or sensory deficit. He exhibits normal muscle tone. Coordination normal.  Skin: Skin is warm, dry and intact.  Psychiatric: His behavior is normal. Judgment and thought content normal.  Anxious    ED Course  Procedures (including critical care time)  Medications  ketorolac (TORADOL) injection 60 mg (60 mg Intramuscular Given 04/17/12 0617)  LORazepam (ATIVAN) tablet 1 mg (1 mg Oral Given 04/17/12 0617)   Patient Vitals for the past 24 hrs:  BP Temp Temp src Pulse Resp SpO2  Height Weight  04/17/12 0601 142/97 mmHg 97 F (36.1 C) Oral 58 18 100 % - -  04/17/12 0557 - - - - - - 5\' 8"  (1.727 m) 160 lb (72.576 kg)    6:57 AM Reevaluation with update and discussion. After initial assessment and treatment, an updated evaluation reveals he is calmer and states his headache has resolved. Thomas Vaughn   Labs Reviewed - No data to display Ct Head Wo Contrast  04/17/2012  *RADIOLOGY REPORT*  Clinical Data: Headache for 5 days.  CT HEAD WITHOUT CONTRAST  Technique:  Contiguous axial images were obtained from the base of the skull through the  vertex without contrast.  Comparison: None.  Findings: There is no evidence of acute infarction, mass lesion, or intra- or extra-axial hemorrhage on CT.  The posterior fossa, including the cerebellum, brainstem and fourth ventricle, is within normal limits.  The third and lateral ventricles, and basal ganglia are unremarkable in appearance.  The cerebral hemispheres are symmetric in appearance, with normal gray- white differentiation.  No mass effect or midline shift is seen.  There is no evidence of fracture; visualized osseous structures are unremarkable in appearance.  The visualized portions of the orbits are within normal limits.  The paranasal sinuses and mastoid air cells are well-aerated.  No significant soft tissue abnormalities are seen.  IMPRESSION: Unremarkable noncontrast CT of the head.   Original Report Authenticated By: Tonia Ghent, M.D.    Nursing Notes Reviewed/ Care Coordinated, and agree without changes. Applicable Imaging Reviewed.  Interpretation of Laboratory Data incorporated into ED treatment  1. Headache       MDM  Nonspecific headache. Patient claims to not be drinking now. Suspect stress related headache. Doubt meningitis, encephalitis, radicular pain, and bacterial infection or metabolic instability. He is stable for discharge     Plan: Home Medications- Tylenol prn; Home Treatments- rest; Recommended follow up- PCP of choice for ongoing care.     Flint Melter, MD 04/17/12 [phone]

## 2015-03-11 DIAGNOSIS — R6889 Other general symptoms and signs: Secondary | ICD-10-CM | POA: Diagnosis not present

## 2015-03-22 ENCOUNTER — Encounter (HOSPITAL_COMMUNITY): Payer: Self-pay | Admitting: Emergency Medicine

## 2015-03-22 ENCOUNTER — Emergency Department (HOSPITAL_COMMUNITY): Payer: Self-pay

## 2015-03-22 DIAGNOSIS — M62838 Other muscle spasm: Secondary | ICD-10-CM | POA: Insufficient documentation

## 2015-03-22 DIAGNOSIS — R0982 Postnasal drip: Secondary | ICD-10-CM | POA: Insufficient documentation

## 2015-03-22 DIAGNOSIS — R0789 Other chest pain: Secondary | ICD-10-CM | POA: Insufficient documentation

## 2015-03-22 DIAGNOSIS — R0602 Shortness of breath: Secondary | ICD-10-CM | POA: Insufficient documentation

## 2015-03-22 DIAGNOSIS — Z8659 Personal history of other mental and behavioral disorders: Secondary | ICD-10-CM | POA: Insufficient documentation

## 2015-03-22 DIAGNOSIS — R0981 Nasal congestion: Secondary | ICD-10-CM | POA: Insufficient documentation

## 2015-03-22 LAB — BASIC METABOLIC PANEL
ANION GAP: 14 (ref 5–15)
BUN: 6 mg/dL (ref 6–20)
CO2: 23 mmol/L (ref 22–32)
Calcium: 9.1 mg/dL (ref 8.9–10.3)
Chloride: 104 mmol/L (ref 101–111)
Creatinine, Ser: 0.84 mg/dL (ref 0.61–1.24)
Glucose, Bld: 133 mg/dL — ABNORMAL HIGH (ref 65–99)
POTASSIUM: 3.6 mmol/L (ref 3.5–5.1)
Sodium: 141 mmol/L (ref 135–145)

## 2015-03-22 LAB — I-STAT TROPONIN, ED: Troponin i, poc: 0.01 ng/mL (ref 0.00–0.08)

## 2015-03-22 LAB — CBC
HEMATOCRIT: 43.8 % (ref 39.0–52.0)
HEMOGLOBIN: 14.6 g/dL (ref 13.0–17.0)
MCH: 29.1 pg (ref 26.0–34.0)
MCHC: 33.3 g/dL (ref 30.0–36.0)
MCV: 87.4 fL (ref 78.0–100.0)
Platelets: 263 10*3/uL (ref 150–400)
RBC: 5.01 MIL/uL (ref 4.22–5.81)
RDW: 12.7 % (ref 11.5–15.5)
WBC: 5.7 10*3/uL (ref 4.0–10.5)

## 2015-03-22 NOTE — ED Notes (Signed)
Pt c/o center chest pain onset today. Pt reports shortness of breath also.

## 2015-03-23 ENCOUNTER — Emergency Department (HOSPITAL_COMMUNITY)
Admission: EM | Admit: 2015-03-23 | Discharge: 2015-03-23 | Disposition: A | Discharge status: Home / Self Care | Payer: Self-pay | Attending: Emergency Medicine | Admitting: Emergency Medicine

## 2015-03-23 ENCOUNTER — Encounter (HOSPITAL_COMMUNITY): Payer: Self-pay | Admitting: Emergency Medicine

## 2015-03-23 DIAGNOSIS — R0789 Other chest pain: Secondary | ICD-10-CM | POA: Insufficient documentation

## 2015-03-23 DIAGNOSIS — Z8659 Personal history of other mental and behavioral disorders: Secondary | ICD-10-CM | POA: Insufficient documentation

## 2015-03-23 DIAGNOSIS — R1013 Epigastric pain: Secondary | ICD-10-CM | POA: Insufficient documentation

## 2015-03-23 MED ORDER — METHOCARBAMOL 500 MG PO TABS: 500.0000 mg | ORAL_TABLET | Freq: Two times a day (BID) | ORAL | Status: DC

## 2015-03-23 MED ORDER — GI COCKTAIL ~~LOC~~
30.0000 mL | Freq: Once | ORAL | Status: AC
Start: 2015-03-23 — End: 2015-03-23
  Administered 2015-03-23: 30 mL via ORAL
  Filled 2015-03-23: qty 30

## 2015-03-23 MED ORDER — NAPROXEN 250 MG PO TABS
500.0000 mg | ORAL_TABLET | Freq: Once | ORAL | Status: AC
  Administered 2015-03-23: 500 mg via ORAL
  Filled 2015-03-23: qty 2

## 2015-03-23 MED ORDER — IBUPROFEN 800 MG PO TABS: 800.0000 mg | ORAL_TABLET | Freq: Three times a day (TID) | ORAL | Status: DC

## 2015-03-23 MED ORDER — METHOCARBAMOL 500 MG PO TABS
1000.0000 mg | ORAL_TABLET | Freq: Once | ORAL | Status: AC
  Administered 2015-03-23: 1000 mg via ORAL
  Filled 2015-03-23: qty 2

## 2015-03-23 NOTE — ED Notes (Signed)
Awake. Verbally responsive. A/O x4. Resp even and unlabored. No audible adventitious breath sounds noted. ABC's intact.  

## 2015-03-23 NOTE — ED Notes (Signed)
Pt left with all his belongings and ambulated out of the treatment area.  

## 2015-03-23 NOTE — ED Notes (Signed)
Per pt, states [facility] was seen at [facility]-states when he was in the car to leave he felt sick-thinks it is from meds he was given

## 2015-03-23 NOTE — Discharge Instructions (Signed)

## 2015-03-23 NOTE — ED Notes (Signed)
Pt reported d/[facility] approx 2 hrs ago and upon leaving facility [facility] and he was drinking alcohol and took Xanax. Pt reported mother had recent HA.

## 2015-03-23 NOTE — Discharge Instructions (Signed)
There is not appear to be an emergent cause for your symptoms at this time. Your exam and EKG were reassuring. Your symptoms are likely due to to musculoskeletal strain. He may take Tylenol or Motrin for your discomfort. Follow up with your doctor as needed. Return to ED for any new or worsening symptoms.  Chest Wall Pain Chest wall pain is pain in or around the bones and muscles of your chest. Sometimes, an injury causes this pain. Sometimes, the cause may not be known. This pain may take several weeks or longer to get better. HOME CARE INSTRUCTIONS  Pay attention to any changes in your symptoms. Take these actions to help with your pain:   Rest as told by your health care provider.   Avoid activities that cause pain. These include any activities that use your chest muscles or your abdominal and side muscles to lift heavy items.   If directed, apply ice to the painful area:  Put ice in a plastic bag.  Place a towel between your skin and the bag.  Leave the ice on for 20 minutes, 2-3 times per day.  Take over-the-counter and prescription medicines only as told by your health care provider.  Do not use tobacco products, including cigarettes, chewing tobacco, and e-cigarettes. If you need help quitting, ask your health care provider.  Keep all follow-up visits as told by your health care provider. This is important. SEEK MEDICAL CARE IF:  You have a fever.  Your chest pain becomes worse.  You have new symptoms. SEEK IMMEDIATE MEDICAL CARE IF:  You have nausea or vomiting.  You feel sweaty or light-headed.  You have a cough with phlegm (sputum) or you cough up blood.  You develop shortness of breath.   This information is not intended to replace advice given to you by your health care provider. Make sure you discuss any questions you have with your health care provider.   Document Released: 12/28/2004 Document Revised: 09/18/2014 Document Reviewed: 03/25/2014 Elsevier  Interactive Patient Education [employer]2016 [employer]

## 2015-03-23 NOTE — ED Provider Notes (Signed)
CSN: [id number]     Arrival date & time 03/22/15  1826 History  By signing my name below, I, Marisue Humble, attest that this documentation has been prepared under the direction and in the presence of Mell Guia, MD . Electronically Signed: Marisue Humble, Scribe. 03/23/2015. 1:02 AM.   Chief Complaint  Patient presents with  . Chest Pain   Patient is a 25 y.o. male presenting with chest pain. The history is provided by the patient. No language interpreter was used.  Chest Pain Pain location:  L chest Pain quality: sharp   Pain radiates to:  Does not radiate Pain radiates to the back: no   Pain severity:  Moderate Onset quality:  Sudden Duration:  9 hours Timing:  Constant Progression:  Unchanged Chronicity:  New Context: not breathing   Relieved by:  Nothing Worsened by:  Nothing tried Ineffective treatments:  None tried Associated symptoms: shortness of breath   Associated symptoms: no fever and not vomiting   Risk factors: no Ehlers-Danlos syndrome    HPI Comments:  Morgan L Harpole is a 25 y.o. male with no pertinent PMHx who presents to the Emergency Department complaining of moderate, constant, central chest pain onset 9 hours ago. Pt reports associated shortness of breath and congestion. He notes pain is alleviated when sitting still. He states he took a Xanex yesterday with no relief. Pt denies change in pain with eating, long plane or car trips recently, vomiting, diarrhea, fever, leg swelling, or recent heavy lifting.  Past Medical History  Diagnosis Date  . ADHD (attention deficit hyperactivity disorder)    History reviewed. No pertinent past surgical history. History reviewed. No pertinent family history. Social History  Substance Use Topics  . Smoking status: Never Smoker   . Smokeless tobacco: None  . Alcohol Use: 4.2 oz/week    6 Cans of beer, 1 Shots of liquor per week     Comment: reports binge drinking    Review of Systems  Constitutional:  Negative for fever.  HENT: Positive for congestion.   Respiratory: Positive for shortness of breath.   Cardiovascular: Positive for chest pain. Negative for leg swelling.  Gastrointestinal: Negative for vomiting and diarrhea.  All other systems reviewed and are negative.  Allergies  Review of patient's allergies indicates no known allergies.  Home Medications   Prior to Admission medications   Medication Sig Start Date End Date Taking? Authorizing Provider  ALPRAZolam (XANAX) 0.25 MG tablet Take 0.25 mg by mouth 2 (two) times daily as needed for anxiety.   Yes Historical Provider, MD   BP 127/79 mmHg  Pulse 88  Temp(Src) 98.8 F (37.1 C) (Oral)  Resp 18  Ht  (1.651 m)  Wt 163 lb 4 oz (74.05 kg)  BMI 27.17 kg/m2  SpO2 96% Physical Exam  Constitutional: He is oriented to person, place, and time. He appears well-developed and well-nourished. No distress.  HENT:  Head: Normocephalic and atraumatic.  Mouth/Throat: Oropharynx is clear and moist.  Post nasal drip  Eyes: EOM are normal. Pupils are equal, round, and reactive to light.  Neck: Normal range of motion. Neck supple. Carotid bruit is not present.  Cardiovascular: Normal rate and regular rhythm.  Exam reveals no gallop and no friction rub.   No murmur heard.  intact DP BL  Pulmonary/Chest: Effort normal and breath sounds normal. No stridor. No respiratory distress. He has no wheezes. He has no rales. He exhibits tenderness.  Abdominal: Soft. Bowel sounds are normal. He exhibits  no mass. There is no tenderness. There is no rebound and no guarding.  Musculoskeletal: Normal range of motion. He exhibits no edema.  Muscle spasm left pectoralis; no cords, no swelling in LE  Neurological: He is alert and oriented to person, place, and time. He has normal reflexes.  Skin: Skin is warm.  Psychiatric: He has a normal mood and affect.  Nursing note and vitals reviewed.  ED Course  Procedures  DIAGNOSTIC STUDIES:  Oxygen  Saturation is 96% on RA, normal by my interpretation.    COORDINATION OF CARE:  12:48 AM Will order muscle relaxer. Discussed treatment plan with pt at bedside and pt agreed to plan.  Labs Review Labs Reviewed  BASIC METABOLIC PANEL - Abnormal; Notable for the following:    Glucose, Bld 133 (*)    All other components within normal limits  CBC  I-STAT TROPOININ, ED    Imaging Review Dg Chest 2 View  03/22/2015  CLINICAL DATA:  Centralized chest pain, weakness, shortness of Breath EXAM: CHEST  2 VIEW COMPARISON:  01/28/2012 FINDINGS: The heart size and mediastinal contours are within normal limits. Both lungs are clear. The visualized skeletal structures are unremarkable. IMPRESSION: No active cardiopulmonary disease. Electronically Signed   By: Charlett NoseKevin  Dover M.D.   On: 03/22/2015 19:55   I have personally reviewed and evaluated these images and lab results as part of my medical decision-making.   EKG Interpretation   Date/Time:  Saturday March 22 2015 18:45:03 EST Ventricular Rate:  80 PR Interval:  164 QRS Duration: 98 QT Interval:  434 QTC Calculation: 500 R Axis:   84 Text Interpretation:  Sinus rhythm with sinus arrhythmia PVC Confirmed by  [facility]  MD, Mosie Angus ([id number]) on 03/23/2015 12:52:02 AM       MDM   Final diagnoses:  None    PERC negative wells 0 highly doubt PE.  Symptoms consistent with MSK pain.  Will treat for same  I personally performed the services described in this documentation, which was scribed in my presence. The recorded information has been reviewed and is accurate.  \    Jamarius Saha, MD 03/23/15 [id number]

## 2015-03-23 NOTE — ED Provider Notes (Signed)
CSN: [id number]     Arrival date & time 03/23/15  0747 History   First MD Initiated Contact with Patient 03/23/15 0751     Chief Complaint  Patient presents with  . Nausea     (Consider location/radiation/quality/duration/timing/severity/associated sxs/prior Treatment) HPI Bayley L Bernabei is a 25 y.o. male who comes in for evaluation of chest discomfort. Patient reports she was seen at: ED earlier this morning for same problem and discharged. He reports when walking to his car he felt a little nauseous, he attributes this to medications he received in the emergency department. He denies any nausea now. He describes discomfort as mild pressure and epigastric region has been ongoing and constant since yesterday afternoon. He reports he was drinking alcohol earlier and celebrating a birthday with one of his friends when he experienced the chest discomfort. No other associated nausea, diaphoresis, radiation of discomfort, numbness or weakness. Reports mom had a heart attack at 51. No history of sudden cardiac death. No other family history.  Past Medical History  Diagnosis Date  . ADHD (attention deficit hyperactivity disorder)    History reviewed. No pertinent past surgical history. No family history on file. Social History  Substance Use Topics  . Smoking status: Never Smoker   . Smokeless tobacco: None  . Alcohol Use: 4.2 oz/week    6 Cans of beer, 1 Shots of liquor per week     Comment: reports binge drinking    Review of Systems A 10 point review of systems was completed and was negative except for pertinent positives and negatives as mentioned in the history of present illness     Allergies  Review of patient's allergies indicates no known allergies.  Home Medications   Prior to Admission medications   Medication Sig Start Date End Date Taking? Authorizing Provider  ALPRAZolam (XANAX) 0.25 MG tablet Take 0.25 mg by mouth once.   Yes Historical Provider, MD  ibuprofen  (ADVIL,MOTRIN) 800 MG tablet Take 1 tablet (800 mg total) by mouth 3 (three) times daily. Patient not taking: Reported on 03/23/2015 03/23/15   April Palumbo, MD  methocarbamol (ROBAXIN) 500 MG tablet Take 1 tablet (500 mg total) by mouth 2 (two) times daily. Patient not taking: Reported on 03/23/2015 03/23/15   April Palumbo, MD   BP 122/84 mmHg  Pulse 59  Temp(Src) 98.9 F (37.2 C) (Oral)  Resp 9  SpO2 97% Physical Exam  Constitutional: He is oriented to person, place, and time. He appears well-developed and well-nourished.  HENT:  Head: Normocephalic and atraumatic.  Mouth/Throat: Oropharynx is clear and moist.  Eyes: Conjunctivae are normal. Pupils are equal, round, and reactive to light. Right eye exhibits no discharge. Left eye exhibits no discharge. No scleral icterus.  Neck: Neck supple.  Cardiovascular: Normal rate, regular rhythm and normal heart sounds.   Pulmonary/Chest: Effort normal and breath sounds normal. No respiratory distress. He has no wheezes. He has no rales. He exhibits tenderness.    Discomfort is exacerbated and replicated exactly with palpation of medial aspect of left pectoralis major muscle.  Abdominal: Soft. There is no tenderness.  Musculoskeletal: He exhibits no tenderness.  Neurological: He is alert and oriented to person, place, and time.  Cranial Nerves II-XII grossly intact  Skin: Skin is warm and dry. No rash noted.  Psychiatric: He has a normal mood and affect.  Nursing note and vitals reviewed.   ED Course  Procedures (including critical care time) Labs Review Labs Reviewed - No data to display  Imaging Review Dg Chest 2 View  03/22/2015  CLINICAL DATA:  Centralized chest pain, weakness, shortness of Breath EXAM: CHEST  2 VIEW COMPARISON:  01/28/2012 FINDINGS: The heart size and mediastinal contours are within normal limits. Both lungs are clear. The visualized skeletal structures are unremarkable. IMPRESSION: No active cardiopulmonary  disease. Electronically Signed   By: Charlett NoseKevin  Dover M.D.   On: 03/22/2015 19:55   I have personally reviewed and evaluated these images and lab results as part of my medical decision-making.   EKG Interpretation   Date/Time:  Sunday March 23 2015 08:12:48 EDT Ventricular Rate:  57 PR Interval:  173 QRS Duration: 97 QT Interval:  417 QTC Calculation: 406 R Axis:   84 Text Interpretation:  Sinus rhythm ST elev, probable normal early repol  pattern EKG WITHIN NORMAL LIMITS Confirmed by LITTLE MD, RACHEL [phone]([id number]) on  03/23/2015 9:55:03 AM     Filed Vitals:   03/23/15 0756 03/23/15 0757 03/23/15 0813 03/23/15 0814  BP: 136/73 136/73 122/84   Pulse: 66 59  59  Temp:  98.9 F (37.2 C)    TempSrc:  Oral    Resp:  18 11 9   SpO2: 97% 99%  97%    MDM  Tierre L Delton Seeelson is a 25 y.o. male was seen earlier this morning at neighboring hospital for similar symptoms of chest pain and discharged with benign workup. He presents to this emergency Department concerned about nausea while walking to his car that has since resolved. His discomfort is replicated exactly with palpation of left chest sternal border. EKG is reassuring. PERC negative and previously documented low heart score. Low suspicion for any acute or emergent pathology. Symptoms are consistent with chest wall pain. Encouraged symptomatic support, NSAID therapy at home. Follow-up with PCP. He understands to return for any new or worsening symptoms. Prior to patient discharge, I discussed and reviewed this case with Dr.Little  Final diagnoses:  Chest wall pain       Joycie PeekBenjamin Kenechukwu Eckstein, PA-C 03/25/15 [id number]  Laurence Spatesachel Morgan Little, MD 03/25/15 2256

## 2015-03-26 ENCOUNTER — Emergency Department (HOSPITAL_COMMUNITY)
Admission: EM | Admit: 2015-03-26 | Discharge: 2015-03-26 | Disposition: A | Discharge status: Home / Self Care | Payer: Self-pay | Attending: Emergency Medicine | Admitting: Emergency Medicine

## 2015-03-26 ENCOUNTER — Emergency Department (HOSPITAL_COMMUNITY): Payer: Self-pay

## 2015-03-26 ENCOUNTER — Encounter (HOSPITAL_COMMUNITY): Payer: Self-pay | Admitting: Family Medicine

## 2015-03-26 DIAGNOSIS — F419 Anxiety disorder, unspecified: Secondary | ICD-10-CM | POA: Insufficient documentation

## 2015-03-26 DIAGNOSIS — Z79899 Other long term (current) drug therapy: Secondary | ICD-10-CM | POA: Insufficient documentation

## 2015-03-26 HISTORY — DX: Anxiety disorder, unspecified: F41.9

## 2015-03-26 LAB — BASIC METABOLIC PANEL
Anion gap: 11 (ref 5–15)
BUN: 8 mg/dL (ref 6–20)
CO2: 24 mmol/L (ref 22–32)
Calcium: 9.8 mg/dL (ref 8.9–10.3)
Chloride: 104 mmol/L (ref 101–111)
Creatinine, Ser: 0.96 mg/dL (ref 0.61–1.24)
GFR calc non Af Amer: >60 mL/min (ref >60)
Glucose, Bld: 94 mg/dL (ref 65–99)
Potassium: 3.8 mmol/L (ref 3.5–5.1)
SODIUM: 139 mmol/L (ref 135–145)

## 2015-03-26 LAB — CBC
HEMATOCRIT: 43 % (ref 39.0–52.0)
Hemoglobin: 14.4 g/dL (ref 13.0–17.0)
MCH: 29.6 pg (ref 26.0–34.0)
MCHC: 33.5 g/dL (ref 30.0–36.0)
MCV: 88.5 fL (ref 78.0–100.0)
Platelets: 249 10*3/uL (ref 150–400)
RBC: 4.86 MIL/uL (ref 4.22–5.81)
RDW: 12.5 % (ref 11.5–15.5)
WBC: 5.2 10*3/uL (ref 4.0–10.5)

## 2015-03-26 LAB — I-STAT TROPONIN, ED: Troponin i, poc: 0 ng/mL (ref 0.00–0.08)

## 2015-03-26 MED ORDER — LORAZEPAM 1 MG PO TABS: 1.0000 mg | ORAL_TABLET | Freq: Three times a day (TID) | ORAL | Status: DC | PRN

## 2015-03-26 MED ORDER — LORAZEPAM 2 MG/ML IJ SOLN
1.0000 mg | Freq: Once | INTRAMUSCULAR | Status: AC
  Administered 2015-03-26: 1 mg via INTRAMUSCULAR
  Filled 2015-03-26: qty 1

## 2015-03-26 MED ORDER — LORAZEPAM 1 MG PO TABS: 1.0000 mg | ORAL_TABLET | Freq: Once | ORAL | Status: DC

## 2015-03-26 MED ORDER — ONDANSETRON 4 MG PO TBDP
4.0000 mg | ORAL_TABLET | Freq: Once | ORAL | Status: AC
  Administered 2015-03-26: 4 mg via ORAL
  Filled 2015-03-26: qty 1

## 2015-03-26 NOTE — ED Provider Notes (Signed)
CSN: [id number]     Arrival date & time 03/26/15  1823 History   First MD Initiated Contact with Patient 03/26/15 2006     Chief Complaint  Patient presents with  . Anxiety  . Chest Pain     (Consider location/radiation/quality/duration/timing/severity/associated sxs/prior Treatment) HPI   Patient comes to the ER with multiple complaints. Hx of anxiety and it has been worsening. Has taken Paxil x 2 days. He feels like his Paxil is causing him chest pain. He also reports that today is the third day of not drinking any alcohol. He has quite before without ETOH withdrawal but did say he is an everyday drinker. He says that he has been getting chest pains, feeling of shakiness all over and sensation that is heart is beating fast and fluttering. He was told this is anxiety in the past and this will be his 3rd visit for it in one week. He says " I am just so anxious all the time and scared" his mom had a heart attack within the past week and so everything that he feels makes him feel as though he is going to have something bad happen.   Thomas Vaughn is a 25 y.o.  male  ROS: The patient denies diaphoresis, fever, headache, weakness (general or focal), confusion, change of vision,  dysphagia, aphagia, shortness of breath,  abdominal pains, nausea, vomiting, diarrhea, lower extremity swelling, rash, neck pain   Past Medical History  Diagnosis Date  . ADHD (attention deficit hyperactivity disorder)   . Anxiety    History reviewed. No pertinent past surgical history. History reviewed. No pertinent family history. Social History  Substance Use Topics  . Smoking status: Never Smoker   . Smokeless tobacco: None  . Alcohol Use: 4.2 oz/week    6 Cans of beer, 1 Shots of liquor per week     Comment: reports binge drinking    Review of Systems  Review of Systems All other systems negative except as documented in the HPI. All pertinent positives and negatives as reviewed in the  HPI.   Allergies  Review of patient's allergies indicates no known allergies.  Home Medications   Prior to Admission medications   Medication Sig Start Date End Date Taking? Authorizing Provider  PARoxetine (PAXIL) 20 MG tablet Take 20 mg by mouth daily.   Yes Historical Provider, MD  ibuprofen (ADVIL,MOTRIN) 800 MG tablet Take 1 tablet (800 mg total) by mouth 3 (three) times daily. Patient not taking: Reported on 03/23/2015 03/23/15   April Palumbo, MD  LORazepam (ATIVAN) 1 MG tablet Take 1 tablet (1 mg total) by mouth 3 (three) times daily as needed for anxiety. 03/26/15   Vahe Pienta Neva Seat, PA-C  methocarbamol (ROBAXIN) 500 MG tablet Take 1 tablet (500 mg total) by mouth 2 (two) times daily. Patient not taking: Reported on 03/23/2015 03/23/15   April Palumbo, MD   BP 145/98 mmHg  Pulse 55  Temp(Src) 98.9 F (37.2 C) (Oral)  Resp 13  SpO2 98% Physical Exam  Constitutional: He appears well-developed and well-nourished. No distress.  HENT:  Head: Normocephalic and atraumatic.  Right Ear: Tympanic membrane and ear canal normal.  Left Ear: Tympanic membrane and ear canal normal.  Nose: Nose normal.  Mouth/Throat: Uvula is midline, oropharynx is clear and moist and mucous membranes are normal.  Eyes: Pupils are equal, round, and reactive to light.  Neck: Normal range of motion. Neck supple.  Cardiovascular: Normal rate and regular rhythm.   Pulmonary/Chest: Effort normal.  Abdominal: Soft.  No signs of abdominal distention  Musculoskeletal:  No LE swelling  Neurological: He is alert.  Acting at baseline  Skin: Skin is warm and dry. No rash noted.  Psychiatric: His speech is normal and behavior is normal. Thought content normal. His mood appears anxious. He does not exhibit a depressed mood.  Nursing note and vitals reviewed.   ED Course  Procedures (including critical care time) Labs Review Labs Reviewed  BASIC METABOLIC PANEL  CBC  I-STAT TROPOININ, ED    Imaging  Review Dg Chest 2 View  03/26/2015  CLINICAL DATA:  Chest pain for 2 days EXAM: CHEST  2 VIEW COMPARISON:  03/22/2015 FINDINGS: The heart size and mediastinal contours are within normal limits. Both lungs are clear. The visualized skeletal structures are unremarkable. IMPRESSION: No active cardiopulmonary disease. Electronically Signed   By: Alcide CleverMark  Lukens M.D.   On: 03/26/2015 18:50   I have personally reviewed and evaluated these images and lab results as part of my medical decision-making.   EKG Interpretation   Date/Time:  Wednesday March 26 2015 18:30:24 EDT Ventricular Rate:  83 PR Interval:  174 QRS Duration: 92 QT Interval:  358 QTC Calculation: 420 R Axis:   90 Text Interpretation:  Sinus rhythm with marked sinus arrhythmia with  frequent Premature ventricular complexes Rightward axis Nonspecific ST and  T wave abnormality Abnormal ECG artifact noted Confirmed by Bebe ShaggyWICKLINE  MD,  Dorinda HillNALD ([id number]) on 03/26/2015 7:20:36 PM      MDM   Final diagnoses:  Anxiety    The patient is not showing signs of active alcohol withdrawal. His blood pressure is running high but is unclear if this is due to anxiety. He just started taking Paxil. I will give him Ativan at home to help with anxiety while waiting for the Paxil to start working. His been reassured that his imaging, lab work and blood work are normal. He's been encouraged to go back to behavioral health to inquire about counseling. The patient is well-appearing and his chest pain is noncardiac.  Discussed briefly with Dr. Bebe ShaggyWickline prior to dc.  Medications  ondansetron (ZOFRAN-ODT) disintegrating tablet 4 mg (4 mg Oral Given 03/26/15 2106)  LORazepam (ATIVAN) injection 1 mg (1 mg Intramuscular Given 03/26/15 2107)     Filed Vitals:   03/26/15 2045 03/26/15 2100  BP: 149/100 145/98  Pulse: 70 55  Temp:    Resp: 16 [address]       Thomas Bolding, PA-C 03/26/15 2112  Zadie Rhineonald Wickline, MD 03/26/15 [phone]

## 2015-03-26 NOTE — ED Notes (Signed)
Pt here for chest pain for the past 2 days after taking newly prescribed medication for depression and anxiety. sts it helps with anxiety.

## 2015-03-26 NOTE — ED Notes (Signed)
Pt denies any concerns about his discharge

## 2015-03-26 NOTE — Discharge Instructions (Signed)
Generalized Anxiety Disorder °Generalized anxiety disorder (GAD) is a mental disorder. It interferes with life functions, including relationships, work, and school. °GAD is different from normal anxiety, which everyone experiences at some point in their lives in response to specific life events and activities. Normal anxiety actually helps us prepare for and get through these life events and activities. Normal anxiety goes away after the event or activity is over.  °GAD causes anxiety that is not necessarily related to specific events or activities. It also causes excess anxiety in proportion to specific events or activities. The anxiety associated with GAD is also difficult to control. GAD can vary from mild to severe. People with severe GAD can have intense waves of anxiety with physical symptoms (panic attacks).  °SYMPTOMS °The anxiety and worry associated with GAD are difficult to control. This anxiety and worry are related to many life events and activities and also occur more days than not for 6 months or longer. People with GAD also have three or more of the following symptoms (one or more in children): °· Restlessness.   °· Fatigue. °· Difficulty concentrating.   °· Irritability. °· Muscle tension. °· Difficulty sleeping or unsatisfying sleep. °DIAGNOSIS °GAD is diagnosed through an assessment by your health care provider. Your health care provider will ask you questions about your mood, physical symptoms, and events in your life. Your health care provider may ask you about your medical history and use of alcohol or drugs, including prescription medicines. Your health care provider may also do a physical exam and blood tests. Certain medical conditions and the use of certain substances can cause symptoms similar to those associated with GAD. Your health care provider may refer you to a mental health specialist for further evaluation. °TREATMENT °The following therapies are usually used to treat GAD:   °· Medication. Antidepressant medication usually is prescribed for long-term daily control. Antianxiety medicines may be added in severe cases, especially when panic attacks occur.   °· Talk therapy (psychotherapy). Certain types of talk therapy can be helpful in treating GAD by providing support, education, and guidance. A form of talk therapy called cognitive behavioral therapy can teach you healthy ways to think about and react to daily life events and activities. °· Stress management techniques. These include yoga, meditation, and exercise and can be very helpful when they are practiced regularly. °A mental health specialist can help determine which treatment is best for you. Some people see improvement with one therapy. However, other people require a combination of therapies. °  °This information is not intended to replace advice given to you by your health care provider. Make sure you discuss any questions you have with your health care provider. °  °Document Released: 04/24/2012 Document Revised: 01/18/2014 Document Reviewed: 04/24/2012 °Elsevier Interactive Patient Education ©2016 [employer] ° °Panic Attacks °Panic attacks are sudden, short-lived surges of severe anxiety, fear, or discomfort. They may occur for no reason when you are relaxed, when you are anxious, or when you are sleeping. Panic attacks may occur for a number of reasons:  °· Healthy people occasionally have panic attacks in extreme, life-threatening situations, such as war or natural disasters. Normal anxiety is a protective mechanism of the body that helps us react to danger (fight or flight response). °· Panic attacks are often seen with anxiety disorders, such as panic disorder, social anxiety disorder, generalized anxiety disorder, and phobias. Anxiety disorders cause excessive or uncontrollable anxiety. They may interfere with your relationships or other life activities. °· Panic attacks are sometimes seen with other   mental  illnesses, such as depression and posttraumatic stress disorder. °· Certain medical conditions, prescription medicines, and drugs of abuse can cause panic attacks. °SYMPTOMS  °Panic attacks start suddenly, peak within 20 minutes, and are accompanied by four or more of the following symptoms: °· Pounding heart or fast heart rate (palpitations). °· Sweating. °· Trembling or shaking. °· Shortness of breath or feeling smothered. °· Feeling choked. °· Chest pain or discomfort. °· Nausea or strange feeling in your stomach. °· Dizziness, light-headedness, or feeling like you will faint. °· Chills or hot flushes. °· Numbness or tingling in your lips or hands and feet. °· Feeling that things are not real or feeling that you are not yourself. °· Fear of losing control or going crazy. °· Fear of dying. °Some of these symptoms can mimic serious medical conditions. For example, you may think you are having a heart attack. Although panic attacks can be very scary, they are not life threatening. °DIAGNOSIS  °Panic attacks are diagnosed through an assessment by your health care provider. Your health care provider will ask questions about your symptoms, such as where and when they occurred. Your health care provider will also ask about your medical history and use of alcohol and drugs, including prescription medicines. Your health care provider may order blood tests or other studies to rule out a serious medical condition. Your health care provider may refer you to a mental health professional for further evaluation. °TREATMENT  °· Most healthy people who have one or two panic attacks in an extreme, life-threatening situation will not require treatment. °· The treatment for panic attacks associated with anxiety disorders or other mental illness typically involves counseling with a mental health professional, medicine, or a combination of both. Your health care provider will help determine what treatment is best for you. °· Panic  attacks due to physical illness usually go away with treatment of the illness. If prescription medicine is causing panic attacks, talk with your health care provider about stopping the medicine, decreasing the dose, or substituting another medicine. °· Panic attacks due to alcohol or drug abuse go away with abstinence. Some adults need professional help in order to stop drinking or using drugs. °HOME CARE INSTRUCTIONS  °· Take all medicines as directed by your health care provider.   °· Schedule and attend follow-up visits as directed by your health care provider. It is important to keep all your appointments. °SEEK MEDICAL CARE IF: °· You are not able to take your medicines as prescribed. °· Your symptoms do not improve or get worse. °SEEK IMMEDIATE MEDICAL CARE IF:  °· You experience panic attack symptoms that are different than your usual symptoms. °· You have serious thoughts about hurting yourself or others. °· You are taking medicine for panic attacks and have a serious side effect. °MAKE SURE YOU: °· Understand these instructions. °· Will watch your condition. °· Will get help right away if you are not doing well or get worse. °  °This information is not intended to replace advice given to you by your health care provider. Make sure you discuss any questions you have with your health care provider. °  °Document Released: 12/28/2004 Document Revised: 01/02/2013 Document Reviewed: 08/11/2012 °Elsevier Interactive Patient Education ©2016 [employer] Inc[employer] ° °

## 2015-04-17 ENCOUNTER — Encounter (HOSPITAL_COMMUNITY): Payer: Self-pay | Admitting: *Deleted

## 2015-04-17 ENCOUNTER — Ambulatory Visit (HOSPITAL_COMMUNITY)
Admission: EM | Admit: 2015-04-17 | Discharge: 2015-04-17 | Disposition: A | Discharge status: Home / Self Care | Payer: Self-pay | Attending: Family Medicine | Admitting: Family Medicine

## 2015-04-17 ENCOUNTER — Emergency Department (HOSPITAL_COMMUNITY)
Admission: EM | Admit: 2015-04-17 | Discharge: 2015-04-18 | Disposition: A | Discharge status: Home / Self Care | Payer: Self-pay | Attending: Emergency Medicine | Admitting: Emergency Medicine

## 2015-04-17 ENCOUNTER — Encounter (HOSPITAL_COMMUNITY): Payer: Self-pay | Admitting: Emergency Medicine

## 2015-04-17 ENCOUNTER — Emergency Department (HOSPITAL_COMMUNITY): Payer: Self-pay

## 2015-04-17 DIAGNOSIS — F1010 Alcohol abuse, uncomplicated: Secondary | ICD-10-CM | POA: Insufficient documentation

## 2015-04-17 DIAGNOSIS — R0600 Dyspnea, unspecified: Secondary | ICD-10-CM | POA: Insufficient documentation

## 2015-04-17 DIAGNOSIS — R1110 Vomiting, unspecified: Secondary | ICD-10-CM | POA: Insufficient documentation

## 2015-04-17 DIAGNOSIS — R0789 Other chest pain: Secondary | ICD-10-CM | POA: Insufficient documentation

## 2015-04-17 DIAGNOSIS — R05 Cough: Secondary | ICD-10-CM | POA: Insufficient documentation

## 2015-04-17 DIAGNOSIS — Z79899 Other long term (current) drug therapy: Secondary | ICD-10-CM | POA: Insufficient documentation

## 2015-04-17 DIAGNOSIS — F419 Anxiety disorder, unspecified: Secondary | ICD-10-CM | POA: Insufficient documentation

## 2015-04-17 DIAGNOSIS — R079 Chest pain, unspecified: Secondary | ICD-10-CM

## 2015-04-17 LAB — BASIC METABOLIC PANEL
Anion gap: 16 — ABNORMAL HIGH (ref 5–15)
BUN: 9 mg/dL (ref 6–20)
CALCIUM: 9.7 mg/dL (ref 8.9–10.3)
CO2: 23 mmol/L (ref 22–32)
CREATININE: 1.08 mg/dL (ref 0.61–1.24)
Chloride: 99 mmol/L — ABNORMAL LOW (ref 101–111)
GFR calc non Af Amer: >60 mL/min (ref >60)
Glucose, Bld: 84 mg/dL (ref 65–99)
Potassium: 3.7 mmol/L (ref 3.5–5.1)
SODIUM: 138 mmol/L (ref 135–145)

## 2015-04-17 LAB — CBC
HCT: 40.8 % (ref 39.0–52.0)
Hemoglobin: 13.5 g/dL (ref 13.0–17.0)
MCH: 28.6 pg (ref 26.0–34.0)
MCHC: 33.1 g/dL (ref 30.0–36.0)
MCV: 86.4 fL (ref 78.0–100.0)
PLATELETS: 227 10*3/uL (ref 150–400)
RBC: 4.72 MIL/uL (ref 4.22–5.81)
RDW: 12.7 % (ref 11.5–15.5)
WBC: 5.1 10*3/uL (ref 4.0–10.5)

## 2015-04-17 LAB — I-STAT TROPONIN, ED: TROPONIN I, POC: 0 ng/mL (ref 0.00–0.08)

## 2015-04-17 NOTE — ED Provider Notes (Signed)
CSN: [id number]     Arrival date & time 04/17/15  1441 History   First MD Initiated Contact with Patient 04/17/15 1528     Chief Complaint  Patient presents with  . Chest Pain   (Consider location/radiation/quality/duration/timing/severity/associated sxs/prior Treatment) HPI History obtained from patient: Location:mid chest Context:awoke from sleep with pain Severity:3  Quality:heavy, tight Timing:  constant Duration:  Since this morning       Relieved by:    nothing Associated symptoms:  Vomited once  Past Medical History  Diagnosis Date  . ADHD (attention deficit hyperactivity disorder)   . Anxiety    History reviewed. No pertinent past surgical history. History reviewed. No pertinent family history. Social History  Substance Use Topics  . Smoking status: Never Smoker   . Smokeless tobacco: None  . Alcohol Use: 4.2 oz/week    6 Cans of beer, 1 Shots of liquor per week     Comment: reports binge drinking    Review of Systems Chest pain Allergies  Review of patient's allergies indicates no known allergies.  Home Medications   Prior to Admission medications   Medication Sig Start Date End Date Taking? Authorizing Provider  ibuprofen (ADVIL,MOTRIN) 800 MG tablet Take 1 tablet (800 mg total) by mouth 3 (three) times daily. Patient not taking: Reported on 03/23/2015 03/23/15   April Palumbo, MD  LORazepam (ATIVAN) 1 MG tablet Take 1 tablet (1 mg total) by mouth 3 (three) times daily as needed for anxiety. 03/26/15   Tiffany Neva Seat, PA-C  methocarbamol (ROBAXIN) 500 MG tablet Take 1 tablet (500 mg total) by mouth 2 (two) times daily. Patient not taking: Reported on 03/23/2015 03/23/15   April Palumbo, MD  PARoxetine (PAXIL) 20 MG tablet Take 20 mg by mouth daily.    Historical Provider, MD   Meds Ordered and Administered this Visit  Medications - No data to display  BP 130/74 mmHg  Pulse 62  Temp(Src) 99.4 F (37.4 C) (Oral)  Resp 16  SpO2 99% No data  found.   Physical Exam NURSES NOTES AND VITAL SIGNS REVIEWED. CONSTITUTIONAL: Well developed, well nourished, no acute distress HEENT: normocephalic, atraumatic EYES: Conjunctiva normal NECK:normal ROM, supple, no adenopathy PULMONARY:No respiratory distress, normal effort CARDIO:  RRR no murmur, chest wall is without tenderness MUSCULOSKELETAL: Normal ROM of all extremities,  SKIN: warm and dry without rash PSYCHIATRIC: Mood and affect, behavior are normal  ED Course  Procedures (including critical care time)  Labs Review Labs Reviewed - No data to display  Imaging Review No results found.   Visual Acuity Review  Right Eye Distance:   Left Eye Distance:   Bilateral Distance:    Right Eye Near:   Left Eye Near:    Bilateral Near:        WUJ:WJXBJ rhuy rate 59 a few PVC's otherwise normal ECG. No acute change as compared to  03/2015 MDM  No diagnosis found.  Patient is reassured that there are no issues that require transfer to higher level of care at this time or additional tests. Patient is advised to continue home symptomatic treatment. Patient is advised that if there are new or worsening symptoms to attend the emergency department, contact primary care provider, or return to UC. Instructions of care provided discharged home in stable condition.    THIS NOTE WAS GENERATED USING A VOICE RECOGNITION SOFTWARE PROGRAM. ALL REASONABLE EFFORTS  WERE MADE TO PROOFREAD THIS DOCUMENT FOR ACCURACY.  I have verbally reviewed the discharge instructions with the patient.  A printed AVS was given to the patient.  All questions were answered prior to discharge.      Tharon AquasFrank C Lorissa Kishbaugh, PA 04/17/15 [phone]

## 2015-04-17 NOTE — ED Notes (Signed)
Pt states he started having chest pain this morning. Pt states he drinks daily.

## 2015-04-17 NOTE — Discharge Instructions (Signed)

## 2015-04-17 NOTE — ED Provider Notes (Signed)
By signing my name below, I, Budd Palmer, attest that this documentation has been prepared under the direction and in the presence of [employer], DO. Electronically Signed: Budd Palmer, ED Scribe. 04/18/2015. 12:20 AM.  TIME SEEN: 12:08 AM  CHIEF COMPLAINT: Chest Pain  HPI: Thomas Vaughn is a 25 y.o. male with a PMHx of ADHD and anxiety who presents to the Emergency Department complaining of recurrent, worsening, constant, pressure-like, central chest pain onset 14 hours ago, waking him up from sleep. He currently rates his pain as a 10/10. Pt states he was seen at Urgent Care this morning, where he had an EKG done, and was discharged home. He then went home and waited for the pain to resolve, taking a nap as well. He notes that the pain then woke him up from sleeping once more, which is why he came to the ED. He reports associated dyspnea when talking, as well as cough and post-tussive emesis. He denies any alleviating factors, but reports exacerbation of the pain with lying supine and with palpation of his chest. He denies a PMHx of DVT, PE. He notes he has had one shot of alcohol this morning, but nothing since then. He notes a PMHx of binge drinking, but denies any illegal drug use.  He denies a PMHx of HTN, DM, and HLD. He does endorse a FHx of heart disease (mother, MI at age 15), and an uncle with DVT at age 58. Pt denies burning chest pain. Pain is worse after drinking. He admits that has been going on for a month intermittently.  ROS: See HPI Constitutional: no fever  Eyes: no drainage  ENT: no runny nose   Cardiovascular:  chest pain  Resp: dyspnea after speaking  GI:  vomiting GU: no dysuria Integumentary: no rash  Allergy: no hives  Musculoskeletal: no leg swelling  Neurological: no slurred speech ROS otherwise negative  PAST MEDICAL HISTORY/PAST SURGICAL HISTORY:  Past Medical History  Diagnosis Date  . ADHD (attention deficit hyperactivity disorder)   . Anxiety      MEDICATIONS:  Prior to Admission medications   Medication Sig Start Date End Date Taking? Authorizing Provider  ibuprofen (ADVIL,MOTRIN) 800 MG tablet Take 1 tablet (800 mg total) by mouth 3 (three) times daily. Patient not taking: Reported on 03/23/2015 03/23/15   April Palumbo, MD  LORazepam (ATIVAN) 1 MG tablet Take 1 tablet (1 mg total) by mouth 3 (three) times daily as needed for anxiety. 03/26/15   Tiffany Neva Seat, PA-C  methocarbamol (ROBAXIN) 500 MG tablet Take 1 tablet (500 mg total) by mouth 2 (two) times daily. Patient not taking: Reported on 03/23/2015 03/23/15   April Palumbo, MD  PARoxetine (PAXIL) 20 MG tablet Take 20 mg by mouth daily.    Historical Provider, MD    ALLERGIES:  No Known Allergies  SOCIAL HISTORY:  Social History  Substance Use Topics  . Smoking status: Never Smoker   . Smokeless tobacco: Not on file  . Alcohol Use: 4.2 oz/week    6 Cans of beer, 1 Shots of liquor per week     Comment: reports binge drinking    FAMILY HISTORY: History reviewed. No pertinent family history.  EXAM: BP 143/86 mmHg  Pulse 73  Temp(Src) 98.5 F (36.9 C) (Oral)  Resp 14  Ht  (1.676 m)  Wt 160 lb (72.576 kg)  BMI 25.84 kg/m2  SpO2 100% CONSTITUTIONAL: Alert and oriented and responds appropriately to questions. Well-appearing; well-nourished HEAD: Normocephalic EYES: Conjunctivae  clear, PERRL ENT: normal nose; no rhinorrhea; moist mucous membranes NECK: Supple, no meningismus, no LAD  CARD: RRR; S1 and S2 appreciated; no murmurs, no clicks, no rubs, no gallops CHEST: anterior chst wall mildly TTP over the sternum without crepitus, ecchymosis, or deformity RESP: Normal chest excursion without splinting or tachypnea; breath sounds clear and equal bilaterally; no wheezes, no rhonchi, no rales, no hypoxia or respiratory distress, speaking full sentences ABD/GI: Normal bowel sounds; non-distended; soft, non-tender, no rebound, no guarding, no peritoneal  signs BACK:  The back appears normal and is non-tender to palpation, there is no CVA tenderness EXT: Normal ROM in all joints; non-tender to palpation; no edema; normal capillary refill; no cyanosis, no calf tenderness or swelling    SKIN: Normal color for age and race; warm; no rash NEURO: Moves all extremities equally, sensation to light touch intact diffusely, cranial nerves II through XII intact PSYCH: The patient's mood and manner are appropriate. Grooming and personal hygiene are appropriate.  MEDICAL DECISION MAKING: Patient here with chest pain that has been constant all day. Labs unremarkable. Troponin negative. D-dimer negative. Chest x-ray clear. EKG shows no ischemic abnormality. Pain improved with GI cocktail Protonix. Suspect there is a component of esophagitis and GERD from his consistent alcohol use. No bloody stools, melena. No hematemesis. Recommended outpatient follow-up. We'll give him outpatient GI resources as well as PCP, detox facilities. He reports drinking alcohol every day. He is nontoxic and currently. We'll discharge with Phenergan, Protonix, Carafate. I doubt that this is a dissection, PE or ACS. Abdominal exam is benign. Doubt esophageal rupture. Discussed return precautions with patient and his wife. They verbalize understanding and are comfortable with this plan.   At this time, I do not feel there is any life-threatening condition present. I have reviewed and discussed all results (EKG, imaging, lab, urine as appropriate), exam findings with patient. I have reviewed nursing notes and appropriate previous records.  I feel the patient is safe to be discharged home without further emergent workup. Discussed usual and customary return precautions. Patient and family (if present) verbalize understanding and are comfortable with this plan.  Patient will follow-up with their primary care provider. If they do not have a primary care provider, information for follow-up has been  provided to them. All questions have been answered.     EKG Interpretation  Date/Time:  Thursday April 17 2015 22:39:28 EDT Ventricular Rate:  72 PR Interval:  160 QRS Duration: 98 QT Interval:  386 QTC Calculation: 422 R Axis:   88 Text Interpretation:  Sinus rhythm with frequent Premature ventricular complexes Otherwise normal ECG No significant change since last tracing Confirmed by Indya Oliveria,  DO, Makinzie Considine ([id number]) on 04/18/2015 12:14:02 AM Also confirmed by Zalma Channing,  DO, Echo Allsbrook ([id number]), editor [place]  [facility], [place] ([id number])  on 04/18/2015 7:00:17 AM        I personally performed the services described in this documentation, which was scribed in my presence. The recorded information has been reviewed and is accurate.   Layla MawKristen N Dorell Gatlin, DO 04/18/15 [phone]

## 2015-04-17 NOTE — ED Notes (Signed)
Patient reports constant midsternal CP non-radiating since this am. Reports vomiting, no cough, no fevers, and mild sob.

## 2015-04-18 LAB — D-DIMER, QUANTITATIVE (NOT AT ARMC)

## 2015-04-18 MED ORDER — ONDANSETRON 4 MG PO TBDP
4.0000 mg | ORAL_TABLET | Freq: Once | ORAL | Status: AC
  Administered 2015-04-18: 4 mg via ORAL
  Filled 2015-04-18: qty 1

## 2015-04-18 MED ORDER — PROMETHAZINE HCL 25 MG PO TABS: 25.0000 mg | ORAL_TABLET | Freq: Four times a day (QID) | ORAL | Status: DC | PRN

## 2015-04-18 MED ORDER — SUCRALFATE 1 GM/10ML PO SUSP: 1.0000 g | Freq: Three times a day (TID) | ORAL | Status: DC

## 2015-04-18 MED ORDER — PANTOPRAZOLE SODIUM 40 MG PO TBEC
40.0000 mg | DELAYED_RELEASE_TABLET | Freq: Once | ORAL | Status: AC
  Administered 2015-04-18: 40 mg via ORAL
  Filled 2015-04-18: qty 1

## 2015-04-18 MED ORDER — KETOROLAC TROMETHAMINE 30 MG/ML IJ SOLN: 60.0000 mg | Freq: Once | INTRAMUSCULAR | Status: DC

## 2015-04-18 MED ORDER — PANTOPRAZOLE SODIUM 40 MG PO TBEC: 40.0000 mg | DELAYED_RELEASE_TABLET | Freq: Every day | ORAL | Status: DC

## 2015-04-18 MED ORDER — GI COCKTAIL ~~LOC~~
30.0000 mL | Freq: Once | ORAL | Status: AC
  Administered 2015-04-18: 30 mL via ORAL
  Filled 2015-04-18: qty 30

## 2015-04-18 NOTE — ED Notes (Signed)
Patient able to dress and ambulate independently 

## 2015-04-18 NOTE — ED Notes (Signed)
MD at bedside. 

## 2015-04-18 NOTE — Discharge Instructions (Signed)
Chest Wall Pain Chest wall pain is pain in or around the bones and muscles of your chest. Sometimes, an injury causes this pain. Sometimes, the cause may not be known. This pain may take several weeks or longer to get better. HOME CARE INSTRUCTIONS  Pay attention to any changes in your symptoms. Take these actions to help with your pain:   Rest as told by your health care provider.   Avoid activities that cause pain. These include any activities that use your chest muscles or your abdominal and side muscles to lift heavy items.   If directed, apply ice to the painful area:  Put ice in a plastic bag.  Place a towel between your skin and the bag.  Leave the ice on for 20 minutes, 2-3 times per day.  Take over-the-counter and prescription medicines only as told by your health care provider.  Do not use tobacco products, including cigarettes, chewing tobacco, and e-cigarettes. If you need help quitting, ask your health care provider.  Keep all follow-up visits as told by your health care provider. This is important. SEEK MEDICAL CARE IF:  You have a fever.  Your chest pain becomes worse.  You have new symptoms. SEEK IMMEDIATE MEDICAL CARE IF:  You have nausea or vomiting.  You feel sweaty or light-headed.  You have a cough with phlegm (sputum) or you cough up blood.  You develop shortness of breath.   This information is not intended to replace advice given to you by your health care provider. Make sure you discuss any questions you have with your health care provider.   Document Released: 12/28/2004 Document Revised: 09/18/2014 Document Reviewed: 03/25/2014 Elsevier Interactive Patient Education 2016 Elsevier Inc.    Possible Esophagitis Esophagitis is inflammation of the esophagus. The esophagus is the tube that carries food and liquids from your mouth to your stomach. Esophagitis can cause soreness or pain in the esophagus. This condition can make it difficult and  painful to swallow.  CAUSES Most causes of esophagitis are not serious. Common causes of this condition include:  Gastroesophageal reflux disease (GERD). This is when stomach contents move back up into the esophagus (reflux).  Repeated vomiting.  An allergic-type reaction, especially caused by food allergies (eosinophilic esophagitis).  Injury to the esophagus by swallowing large pills with or without water, or swallowing certain types of medicines.  Swallowing (ingesting) harmful chemicals, such as household cleaning products.  Heavy alcohol use.  An infection of the esophagus.This most often occurs in people who have a weakened immune system.  Radiation or chemotherapy treatment for cancer.  Certain diseases such as sarcoidosis, Crohn disease, and scleroderma. SYMPTOMS Symptoms of this condition include:  Difficult or painful swallowing.  Pain with swallowing acidic liquids, such as citrus juices.  Pain with burping.  Chest pain.  Difficulty breathing.  Nausea.  Vomiting.  Pain in the abdomen.  Weight loss.  Ulcers in the mouth.  Patches of white material in the mouth (candidiasis).  Fever.  Coughing up blood or vomiting blood.  Stool that is black, tarry, or bright red. DIAGNOSIS Your health care provider will take a medical history and perform a physical exam. You may also have other tests, including:  An endoscopy to examine your stomach and esophagus with a small camera.  A test that measures the acidity level in your esophagus.  A test that measures how much pressure is on your esophagus.  A barium swallow or modified barium swallow to show the shape, size, and  functioning of your esophagus.  Allergy tests. TREATMENT Treatment for this condition depends on the cause of your esophagitis. In some cases, steroids or other medicines may be given to help relieve your symptoms or to treat the underlying cause of your condition. You may have to make  some lifestyle changes, such as:  Avoiding alcohol.  Quitting smoking.  Changing your diet.  Exercising.  Changing your sleep habits and your sleep environment. HOME CARE INSTRUCTIONS Take these actions to decrease your discomfort and to help avoid complications. Diet  Follow a diet as recommended by your health care provider. This may involve avoiding foods and drinks such as:  Coffee and tea (with or without caffeine).  Drinks that contain alcohol.  Energy drinks and sports drinks.  Carbonated drinks or sodas.  Chocolate and cocoa.  Peppermint and mint flavorings.  Garlic and onions.  Horseradish.  Spicy and acidic foods, including peppers, chili powder, curry powder, vinegar, hot sauces, and barbecue sauce.  Citrus fruit juices and citrus fruits, such as oranges, lemons, and limes.  Tomato-based foods, such as red sauce, chili, salsa, and pizza with red sauce.  Fried and fatty foods, such as donuts, french fries, potato chips, and high-fat dressings.  High-fat meats, such as hot dogs and fatty cuts of red and white meats, such as rib eye steak, sausage, ham, and bacon.  High-fat dairy items, such as whole milk, butter, and cream cheese.  Eat small, frequent meals instead of large meals.  Avoid drinking large amounts of liquid with your meals.  Avoid eating meals during the 2-3 hours before bedtime.  Avoid lying down right after you eat.  Do not exercise right after you eat.  Avoid foods and drinks that seem to make your symptoms worse. General Instructions  Pay attention to any changes in your symptoms.  Take over-the-counter and prescription medicines only as told by your health care provider. Do not take aspirin, ibuprofen, or other NSAIDs unless your health care provider told you to do so.  If you have trouble taking pills, use a pill splitter to decrease the size of the pill. This will decrease the chance of the pill getting stuck or injuring your  esophagus on the way down. Also, drink water after you take a pill.  Do not use any tobacco products, including cigarettes, chewing tobacco, and e-cigarettes. If you need help quitting, ask your health care provider.  Wear loose-fitting clothing. Do not wear anything tight around your waist that causes pressure on your abdomen.  Raise (elevate) the head of your bed about 6 inches (15 cm).  Try to reduce your stress, such as with yoga or meditation. If you need help reducing stress, ask your health care provider.  If you are overweight, reduce your weight to an amount that is healthy for you. Ask your health care provider for guidance about a safe weight loss goal.  Keep all follow-up visits as told by your health care provider. This is important. SEEK MEDICAL CARE IF:  You have new symptoms.  You have unexplained weight loss.  You have difficulty swallowing, or it hurts to swallow.  You have wheezing or a persistent cough.  Your symptoms do not improve with treatment.  You have frequent heartburn for more than two weeks. SEEK IMMEDIATE MEDICAL CARE IF:  You have severe pain in your arms, neck, jaw, teeth, or back.  You feel sweaty, dizzy, or light-headed.  You have chest pain or shortness of breath.  You vomit and  your vomit looks like blood or coffee grounds.  Your stool is bloody or black.  You have a fever.  You cannot swallow, drink, or eat.   This information is not intended to replace advice given to you by your health care provider. Make sure you discuss any questions you have with your health care provider.   Document Released: 02/05/2004 Document Revised: 09/18/2014 Document Reviewed: 04/24/2014 Elsevier Interactive Patient Education 2016 [employer].  Alcohol Use Disorder Alcohol use disorder is a mental disorder. It is not a one-time incident of heavy drinking. Alcohol use disorder is the excessive and uncontrollable use of alcohol over time that leads to  problems with functioning in one or more areas of daily living. People with this disorder risk harming themselves and others when they drink to excess. Alcohol use disorder also can cause other mental disorders, such as mood and anxiety disorders, and serious physical problems. People with alcohol use disorder often misuse other drugs.  Alcohol use disorder is common and widespread. Some people with this disorder drink alcohol to cope with or escape from negative life events. Others drink to relieve chronic pain or symptoms of mental illness. People with a family history of alcohol use disorder are at higher risk of losing control and using alcohol to excess.  Drinking too much alcohol can cause injury, accidents, and health problems. One drink can be too much when you are:  Working.  Pregnant or breastfeeding.  Taking medicines. Ask your doctor.  Driving or planning to drive. SYMPTOMS  Signs and symptoms of alcohol use disorder may include the following:   Consumption ofalcohol inlarger amounts or over a longer period of time than intended.  Multiple unsuccessful attempts to cutdown or control alcohol use.   A great deal of time spent obtaining alcohol, using alcohol, or recovering from the effects of alcohol (hangover).  A strong desire or urge to use alcohol (cravings).   Continued use of alcohol despite problems at work, school, or home because of alcohol use.   Continued use of alcohol despite problems in relationships because of alcohol use.  Continued use of alcohol in situations when it is physically hazardous, such as driving a car.  Continued use of alcohol despite awareness of a physical or psychological problem that is likely related to alcohol use. Physical problems related to alcohol use can involve the brain, heart, liver, stomach, and intestines. Psychological problems related to alcohol use include intoxication, depression, anxiety, psychosis, delirium, and  dementia.   The need for increased amounts of alcohol to achieve the same desired effect, or a decreased effect from the consumption of the same amount of alcohol (tolerance).  Withdrawal symptoms upon reducing or stopping alcohol use, or alcohol use to reduce or avoid withdrawal symptoms. Withdrawal symptoms include:  Racing heart.  Hand tremor.  Difficulty sleeping.  Nausea.  Vomiting.  Hallucinations.  Restlessness.  Seizures. DIAGNOSIS Alcohol use disorder is diagnosed through an assessment by your health care provider. Your health care provider may start by asking three or four questions to screen for excessive or problematic alcohol use. To confirm a diagnosis of alcohol use disorder, at least two symptoms must be present within a 21-month period. The severity of alcohol use disorder depends on the number of symptoms:  Mild--two or three.  Moderate--four or five.  Severe--six or more. Your health care provider may perform a physical exam or use results from lab tests to see if you have physical problems resulting from alcohol use. Your  health care provider may refer you to a mental health professional for evaluation. TREATMENT  Some people with alcohol use disorder are able to reduce their alcohol use to low-risk levels. Some people with alcohol use disorder need to quit drinking alcohol. When necessary, mental health professionals with specialized training in substance use treatment can help. Your health care provider can help you decide how severe your alcohol use disorder is and what type of treatment you need. The following forms of treatment are available:   Detoxification. Detoxification involves the use of prescription medicines to prevent alcohol withdrawal symptoms in the first week after quitting. This is important for people with a history of symptoms of withdrawal and for heavy drinkers who are likely to have withdrawal symptoms. Alcohol withdrawal can be dangerous  and, in severe cases, cause death. Detoxification is usually provided in a hospital or in-patient substance use treatment facility.  Counseling or talk therapy. Talk therapy is provided by substance use treatment counselors. It addresses the reasons people use alcohol and ways to keep them from drinking again. The goals of talk therapy are to help people with alcohol use disorder find healthy activities and ways to cope with life stress, to identify and avoid triggers for alcohol use, and to handle cravings, which can cause relapse.  Medicines.Different medicines can help treat alcohol use disorder through the following actions:  Decrease alcohol cravings.  Decrease the positive reward response felt from alcohol use.  Produce an uncomfortable physical reaction when alcohol is used (aversion therapy).  Support groups. Support groups are run by people who have quit drinking. They provide emotional support, advice, and guidance. These forms of treatment are often combined. Some people with alcohol use disorder benefit from intensive combination treatment provided by specialized substance use treatment centers. Both inpatient and outpatient treatment programs are available.   This information is not intended to replace advice given to you by your health care provider. Make sure you discuss any questions you have with your health care provider.   Document Released: 02/05/2004 Document Revised: 01/18/2014 Document Reviewed: 04/06/2012 Elsevier Interactive Patient Education [employer]   To find a primary care or specialty doctor please call [phone] or [phone] to access "[place] Find a Doctor Service."  You may also go on the [facility] website at [email or web address]  There are also multiple [place] [place] [facility] practices throughout the [place] that are frequently accepting new patients. You may find a clinic that is close to your home and contact  them.  [facility] -  [address] [place] [address] [phone]  [facility] in [place] (also locations in [place] and [place]) -  [address] [place] [address] [phone]  [facility] -  [address] [place] [address] [phone]   Community Resource Guide Outpatient Counseling/Substance Abuse Adult The United Ways 211 is a great source of information about community services available.  Access by dialing 2-1-1 from anywhere in [place], or by website -  PooledIncome.pl.   Other Local Resources (Updated 01/2015)  Crisis Hotlines   Services     Area Served  [employer]  Crisis Hotline, available 24 hours a day, 7 days a week: ([phone] [facility], [place]   [facility]  Crisis Hotline, available 24 hours a day, 7 days a week: 810-167-6115 [facility], [place]  [facility]  Suicide Prevention Hotline, available 24 hours a day, 7 days  a week: 587-786-5268 [facility], [place]  [employer], available 24 hours a day, 7 days a week: [phone] [facility], [place]   [facility] Access to [employer] Hotline, available 24 hours a day, 7 days a week: 802-698-3339 All   [facility]  Crisis Hotline, available 24 hours a day, 7 days a week: 3212095592 All   Other Local Resources (Updated 01/2015)  Outpatient Counseling/ Substance Abuse Programs  Services     Address and Phone Number  ADS (Alcohol and Drug Services)   Options include Individual counseling, group counseling, intensive outpatient program (several hours a day, several days a week)  Offers depression assessments  Provides methadone maintenance program [phone] [address] [place], [place] [address]   [facility]   Offers partial hospitalization/day treatment and DUI/DWI  programs  [employer], private insurance [phone] [address] [place], [place] [address]  [facility]    Services include intensive outpatient program (several hours a day, several days a week), outpatient treatment, DUI/DWI services, family education  Also has some services specifically for [employer] transitional housing  [phone] [address] [place], [place] [address]     [facility]  [employer], private pay, and private insurance [phone] [address] [place], [place] [address]  [employer] [facility]  Services include individual counseling, substance abuse intensive outpatient program (several hours a day, several days a week), day treatment  Delene Loll, Medicaid, private insurance [phone] [address] [place], [place] [address]  [facility]   Offers substance abuse intensive outpatient program (several hours a day, several days a week), partial hospitalization program [phone] [address] [place], [place] [address]  [phone] [address] [place], [place] [address]  [phone] [address] [place], [place] [address]  [phone], [address] [place], [place] [address]  [facility]  Individual counseling only  Accepts private insurance only [phone] [address] [place], [place] [address]  [facility]  Methadone maintenance program [phone] [address] [place], [place] [address]  [facility]  Walk-In Clinic providing substance abuse and mental health counseling  Accepts Medicaid, Medicare, private insurance  Offers sliding scale for uninsured [phone] [address] [place], [place]   [facility] [place], [employer].  Offers individual counseling, and intensive in-home services [phone] [address] [place], [place] [address]  [facility] individual counseling, family counseling, group therapy, domestic violence counseling, consumer credit counseling  Accepts Medicare, Medicaid, private insurance  Offers sliding scale for uninsured [phone] [address] [place], [place] [address]  [phone] [facility], [address] [place], [place] [address]  [facility]  Offers individual, family and group counseling  3 locations - [place], [place], and [place]  [phone]  [address] [place], [place] [address]  [address] [place], [place] [address]  [address] [place], [place] [address]  [facility] Marg[facility]    Offers psychiatric assessment, 8-week Intensive Outpatient Program (several hours a day, several times a week, daytime or evenings), early recovery group, family Program, medication management  Private pay or private insurance only [phone], or  [phone] [address] [place], [place] [address]  [place] [employer] individual, couples and family counseling  Accepts Medicaid, private insurance, and sliding scale for uninsured [phone] [address] [employer]  [place], [place] [address]  Len Blalock, MD  Individual counseling  Private insurance [phone] [address] [place], [place] [address]  [facility]   Offers assessment, substance abuse treatment, and behavioral health treatment [phone] [address] [place], [place] [address]  [facility]  Individual counseling  Accepts private insurance [phone] [address] [place], [place] [address]  [facility]  Individual counseling  Delene Loll, private insurance [phone] [address] [place], [place] [address]  [facility]    Offers intensive outpatient program (several hours a day, several times a week)  Private pay, private  insurance [phone] [facility] [place], [place]  [facility]  Individual counseling  Medicare, private insurance [phone] [address] [place], [place] [address]  [facility]    Offers intensive outpatient program (several hours a day, several times a week) and partial hospitalization program [phone] [address] [place], [place] [address]  Emerson Monte, MD  Individual counseling [phone] [address] [place], [place] [address]  [facility]  Offers Christian counseling to individuals, couples, and families  Accepts Medicare and private insurance; offers sliding scale for uninsured [phone] [address] [place], [place] [address]  [facility]  [place] counseling [phone] [address] [place], [place] [address]  [facility] crisis counseling, individual counseling, group therapy, in-home therapy, domestic violence services, day treatment, DWI services, Administrator, arts (CST), Assertive Community Treatment Team (ACTT), substance abuse Intensive Outpatient Program (several hours a day, several times a week)  2 locations - [place] and [place] [phone] [address] [place], [place] [address]  [phone] [address] [place], [place] [address]  [facility]     Individual counseling and group therapy  Accepts private insurance, [place], [place] [phone] [address] [place], [place]  [facility]  Offers individual and family counseling  Offers LGBTQ services  Accepts private insurance and private pay [phone] [address] [place], [place] [address]  [facility]    Offers individual counseling, group therapy, and outpatient detox  Accepts private insurance [phone] [address] [place], [place]  [facility]  Individual counseling  Accepts Medicare, private insurance [phone] [address] [place], [place] [address]  [employer]  Individual counseling  Accepts Medicare, private insurance [phone] [address] [place], [place] [address]  [facility]   Offers substance abuse Intensive Outpatient Program (several hours a day, several times a week) [phone], or [phone] [place], [place]    [employer] Guide Inpatient Behavioral Health/Residential  Substance Abuse Treatment Adults The United Ways 211 is a great source of information about community services available.  Access by dialing 2-1-1 from anywhere in [place], or by website -  PooledIncome.pl.   (Updated 01/2015)  Crisis Assistance 24 hours a day   Services Offered    Area [employer]  24-hour crisis assistance: [phone] [place], [place]   [facility]  24-hour crisis assistance:843-197-0909 [place], [place]  [place]   24-hour crisis assistance: [phone] [place], [place]   [facility] Access to Care Line  24-hour crisis assistance; 406-383-8278 All   [facility]  24-hour crisis response line: 6828345491 All   Other Local Resources (Updated 01/2015)  Inpatient Behavioral Health/Residential Substance Abuse Treatment Programs   Services      Address  and Phone Number  [facility] ([facility])   14-day residential rehabilitation  [phone] [address] [place], [place]  [facility] ([facility])    Detox - private pay only  14-day residential rehabilitation -  Medicaid, insurance, private pay only [phone], or [phone] [address] [place], [place] [address]   [facility] [employer] only  Multiple facilities [phone] admissions   [facility])   90-day program  Must be homeless to participate  [phone], or [phone] Marcy Panning, [facility]  [facility] only [phone], or  [phone] [address] [place], [place] [address]  [facility] Residential Treatment Services     Must make an appointment  Transportation is offered from [place].  Accepts private pay, Sheryn Bison [facility] [phone]  [address] [place], [place] [address]   [employer]  Females only  Associated with the [facility] [phone] [address] [place], [place] [address]  [facility] only [phone], or [phone] [address] [place], [id number]  [facility]    Detox  Residential rehabilitation  Private insurance only  Multiple locations [phone] admissions  [facility]    Private pay  Private insurance [phone] [address] [place], [place] [address]  [facility]    Males only  Fee required at time of admission [phone] [address] [place], [place] [address]  [facility]    Private pay only  [phone] [place], [place]  [facility]    [facility] - private pay, Medicaid  [facility] for males  - Medicare, Medicaid, insurance, private pay [phone] [address] [place], [place]   [employer]    Walk-in interviews Monday - Saturday from 8 am - 4 pm  Individuals with legal charges are not eligible [phone] [address] [place], [place] [address]  [facility]   Must be willing to work  Must attend Alcoholics Anonymous meetings [phone] [address] [place], [place]   [facility] [employer]    Faith-based program  Private pay only [phone] [address] [place], [place]

## 2015-05-16 ENCOUNTER — Emergency Department (HOSPITAL_COMMUNITY): Payer: Self-pay

## 2015-05-16 ENCOUNTER — Emergency Department (HOSPITAL_COMMUNITY)
Admission: EM | Admit: 2015-05-16 | Discharge: 2015-05-16 | Disposition: A | Discharge status: Home / Self Care | Payer: Self-pay | Attending: Emergency Medicine | Admitting: Emergency Medicine

## 2015-05-16 ENCOUNTER — Encounter (HOSPITAL_COMMUNITY): Payer: Self-pay | Admitting: *Deleted

## 2015-05-16 DIAGNOSIS — R079 Chest pain, unspecified: Secondary | ICD-10-CM

## 2015-05-16 DIAGNOSIS — K2100 Gastro-esophageal reflux disease with esophagitis, without bleeding: Secondary | ICD-10-CM

## 2015-05-16 DIAGNOSIS — F419 Anxiety disorder, unspecified: Secondary | ICD-10-CM | POA: Insufficient documentation

## 2015-05-16 DIAGNOSIS — Z202 Contact with and (suspected) exposure to infections with a predominantly sexual mode of transmission: Secondary | ICD-10-CM | POA: Insufficient documentation

## 2015-05-16 LAB — BASIC METABOLIC PANEL
ANION GAP: 11 (ref 5–15)
BUN: 10 mg/dL (ref 6–20)
CALCIUM: 9.6 mg/dL (ref 8.9–10.3)
CO2: 25 mmol/L (ref 22–32)
Chloride: 103 mmol/L (ref 101–111)
Creatinine, Ser: 0.85 mg/dL (ref 0.61–1.24)
GFR calc Af Amer: >60 mL/min (ref >60)
GLUCOSE: 93 mg/dL (ref 65–99)
Potassium: 3.9 mmol/L (ref 3.5–5.1)
Sodium: 139 mmol/L (ref 135–145)

## 2015-05-16 LAB — CBC
HCT: 41.4 % (ref 39.0–52.0)
Hemoglobin: 13.6 g/dL (ref 13.0–17.0)
MCH: 29.4 pg (ref 26.0–34.0)
MCHC: 32.9 g/dL (ref 30.0–36.0)
MCV: 89.6 fL (ref 78.0–100.0)
Platelets: 240 10*3/uL (ref 150–400)
RBC: 4.62 MIL/uL (ref 4.22–5.81)
RDW: 13.8 % (ref 11.5–15.5)
WBC: 3.3 10*3/uL — ABNORMAL LOW (ref 4.0–10.5)

## 2015-05-16 LAB — I-STAT TROPONIN, ED: TROPONIN I, POC: 0 ng/mL (ref 0.00–0.08)

## 2015-05-16 MED ORDER — PANTOPRAZOLE SODIUM 40 MG PO TBEC
40.0000 mg | DELAYED_RELEASE_TABLET | Freq: Once | ORAL | Status: AC
  Administered 2015-05-16: 40 mg via ORAL
  Filled 2015-05-16: qty 1

## 2015-05-16 MED ORDER — METRONIDAZOLE 500 MG PO TABS
2000.0000 mg | ORAL_TABLET | Freq: Once | ORAL | Status: AC
  Administered 2015-05-16: 2000 mg via ORAL
  Filled 2015-05-16: qty 4

## 2015-05-16 MED ORDER — AZITHROMYCIN 250 MG PO TABS
1000.0000 mg | ORAL_TABLET | Freq: Once | ORAL | Status: AC
  Administered 2015-05-16: 1000 mg via ORAL
  Filled 2015-05-16: qty 4

## 2015-05-16 MED ORDER — LIDOCAINE HCL (PF) 1 % IJ SOLN: 2.0000 mL | Freq: Once | INTRAMUSCULAR | Status: DC

## 2015-05-16 MED ORDER — OMEPRAZOLE 20 MG PO CPDR: 20.0000 mg | DELAYED_RELEASE_CAPSULE | Freq: Two times a day (BID) | ORAL | Status: DC

## 2015-05-16 MED ORDER — STERILE WATER FOR INJECTION IJ SOLN
INTRAMUSCULAR | Status: AC
  Administered 2015-05-16: 10 mL
  Filled 2015-05-16: qty 10

## 2015-05-16 MED ORDER — CEFTRIAXONE SODIUM 250 MG IJ SOLR
250.0000 mg | Freq: Once | INTRAMUSCULAR | Status: AC
  Administered 2015-05-16: 250 mg via INTRAMUSCULAR
  Filled 2015-05-16: qty 250

## 2015-05-16 NOTE — ED Notes (Signed)
Pt reports onset this am of mid chest tightness with sob. Had n/v x 1 this morning, denies recent cough.

## 2015-05-16 NOTE — ED Provider Notes (Signed)
CSN: [id number]     Arrival date & time 05/16/15  1233 History   First MD Initiated Contact with Patient 05/16/15 1703     Chief Complaint  Patient presents with  . Chest Pain  . Shortness of Breath      HPI  Presents for evaluation of chest pain. Waking this morning. Taking last night. Upset stomach with nausea. She'll feeling pressure in his chest. Became anxious. Got in his car. The hospital. Vomited in the parking lot. Symptoms are better. Her chest pain currently. Not short of breath. Similar evaluation last month for similar complaints. He continues to drink daily. States he had a call from her previous sex partner from about ago that he should be treated for gonorrhea because "she tested positive". He states I gotta get it taken care of man, I got married this month!!".  Past Medical History  Diagnosis Date  . ADHD (attention deficit hyperactivity disorder)   . Anxiety    History reviewed. No pertinent past surgical history. History reviewed. No pertinent family history. Social History  Substance Use Topics  . Smoking status: Never Smoker   . Smokeless tobacco: None  . Alcohol Use: 4.2 oz/week    6 Cans of beer, 1 Shots of liquor per week     Comment: reports binge drinking    Review of Systems  Constitutional: Negative for fever, chills, diaphoresis, appetite change and fatigue.  HENT: Negative for mouth sores, sore throat and trouble swallowing.   Eyes: Negative for visual disturbance.  Respiratory: Negative for cough, chest tightness, shortness of breath and wheezing.   Cardiovascular: Positive for chest pain.  Gastrointestinal: Negative for nausea, vomiting, abdominal pain, diarrhea and abdominal distention.  Endocrine: Negative for polydipsia, polyphagia and polyuria.  Genitourinary: Negative for dysuria, frequency and hematuria.  Musculoskeletal: Negative for gait problem.  Skin: Negative for color change, pallor and rash.  Neurological: Negative for dizziness,  syncope, light-headedness and headaches.  Hematological: Does not bruise/bleed easily.  Psychiatric/Behavioral: Negative for behavioral problems and confusion.      Allergies  Lorazepam  Home Medications   Prior to Admission medications   Medication Sig Start Date End Date Taking? Authorizing Provider  omeprazole (PRILOSEC) 20 MG capsule Take 1 capsule (20 mg total) by mouth 2 (two) times daily. 05/16/15   Rolland Porter, MD  pantoprazole (PROTONIX) 40 MG tablet Take 1 tablet (40 mg total) by mouth daily. Patient not taking: Reported on 05/16/2015 04/18/15   Layla Maw Ward, DO  promethazine (PHENERGAN) 25 MG tablet Take 1 tablet (25 mg total) by mouth every 6 (six) hours as needed for nausea or vomiting. Patient not taking: Reported on 05/16/2015 04/18/15   Kristen N Ward, DO  sucralfate (CARAFATE) 1 GM/10ML suspension Take 10 mLs (1 g total) by mouth 4 (four) times daily -  with meals and at bedtime. Patient not taking: Reported on 05/16/2015 04/18/15   Kristen N Ward, DO   BP 138/82 mmHg  Pulse 50  Temp(Src) 98.8 F (37.1 C) (Oral)  Resp 11  SpO2 100% Physical Exam  Constitutional: He is oriented to person, place, and time. He appears well-developed and well-nourished. No distress.  HENT:  Head: Normocephalic.  Eyes: Conjunctivae are normal. Pupils are equal, round, and reactive to light. No scleral icterus.  Neck: Normal range of motion. Neck supple. No thyromegaly present.  Cardiovascular: Normal rate and regular rhythm.  Exam reveals no gallop and no friction rub.   No murmur heard. Pulmonary/Chest: Effort normal and breath  sounds normal. No respiratory distress. He has no wheezes. He has no rales.  Abdominal: Soft. Bowel sounds are normal. He exhibits no distension. There is no tenderness. There is no rebound.  Musculoskeletal: Normal range of motion.  Neurological: He is alert and oriented to person, place, and time.  Skin: Skin is warm and dry. No rash noted.  Psychiatric: He has a  normal mood and affect. His behavior is normal.    ED Course  Procedures (including critical care time) Labs Review Labs Reviewed  CBC - Abnormal; Notable for the following:    WBC 3.3 (*)    All other components within normal limits  BASIC METABOLIC PANEL  I-STAT TROPOININ, ED    Imaging Review Dg Chest 2 View  05/16/2015  CLINICAL DATA:  Mid chest pain for 2 days. EXAM: CHEST - 2 VIEW COMPARISON:  Two-view chest x-ray 04/17/2015 FINDINGS: The heart size and mediastinal contours are within normal limits. Both lungs are clear. The visualized skeletal structures are unremarkable. IMPRESSION: Negative two view chest x-ray Electronically Signed   By: Marin Robertshristopher  Mattern M.D.   On: 05/16/2015 13:10   I have personally reviewed and evaluated these images and lab results as part of my medical decision-making.   EKG Interpretation   Date/Time:  Friday May 16 2015 17:31:46 EDT Ventricular Rate:  49 PR Interval:  175 QRS Duration: 99 QT Interval:  434 QTC Calculation: 392 R Axis:   88 Text Interpretation:  Sinus bradycardia Probable left ventricular  hypertrophy Nonspecific T abnrm, anterolateral leads Present 06/2009 Early  repolarization patern, noted 06/2009 Confirmed by Fayrene FearingJAMES  MD, Jakyren Fluegge ([id number])  on 05/16/2015 5:34:36 PM      MDM   Final diagnoses:  Chest pain, unspecified chest pain type  Gastroesophageal reflux disease with esophagitis  STD exposure    [place] almost as an afterthought that he received a call from her prior unprotected sex partner that he should be checked for gonorrhea because "she tested positive".  Twice, once again he'll be treated for his esophagitis. He continues to drink daily. Was told to be abstinent from alcohol. Avoid large meals. No tobacco caffeine anti-inflammatories.  Recommended he has swab testing, and that he tell his new wife to be tested as well. He declines testing.    Rolland PorterMark Lunden Stieber, MD 05/16/15 [phone]

## 2015-05-16 NOTE — Discharge Instructions (Signed)
Esophagitis Esophagitis is inflammation of the esophagus. The esophagus is the tube that carries food and liquids from your mouth to your stomach. Esophagitis can cause soreness or pain in the esophagus. This condition can make it difficult and painful to swallow.  CAUSES Most causes of esophagitis are not serious. Common causes of this condition include:  Gastroesophageal reflux disease (GERD). This is when stomach contents move back up into the esophagus (reflux).  Repeated vomiting.  An allergic-type reaction, especially caused by food allergies (eosinophilic esophagitis).  Injury to the esophagus by swallowing large pills with or without water, or swallowing certain types of medicines.  Swallowing (ingesting) harmful chemicals, such as household cleaning products.  Heavy alcohol use.  An infection of the esophagus.This most often occurs in people who have a weakened immune system.  Radiation or chemotherapy treatment for cancer.  Certain diseases such as sarcoidosis, Crohn disease, and scleroderma. SYMPTOMS Symptoms of this condition include:  Difficult or painful swallowing.  Pain with swallowing acidic liquids, such as citrus juices.  Pain with burping.  Chest pain.  Difficulty breathing.  Nausea.  Vomiting.  Pain in the abdomen.  Weight loss.  Ulcers in the mouth.  Patches of white material in the mouth (candidiasis).  Fever.  Coughing up blood or vomiting blood.  Stool that is black, tarry, or bright red. DIAGNOSIS Your health care provider will take a medical history and perform a physical exam. You may also have other tests, including:  An endoscopy to examine your stomach and esophagus with a small camera.  A test that measures the acidity level in your esophagus.  A test that measures how much pressure is on your esophagus.  A barium swallow or modified barium swallow to show the shape, size, and functioning of your esophagus.  Allergy  tests. TREATMENT Treatment for this condition depends on the cause of your esophagitis. In some cases, steroids or other medicines may be given to help relieve your symptoms or to treat the underlying cause of your condition. You may have to make some lifestyle changes, such as:  Avoiding alcohol.  Quitting smoking.  Changing your diet.  Exercising.  Changing your sleep habits and your sleep environment. HOME CARE INSTRUCTIONS Take these actions to decrease your discomfort and to help avoid complications. Diet  Follow a diet as recommended by your health care provider. This may involve avoiding foods and drinks such as:  Coffee and tea (with or without caffeine).  Drinks that contain alcohol.  Energy drinks and sports drinks.  Carbonated drinks or sodas.  Chocolate and cocoa.  Peppermint and mint flavorings.  Garlic and onions.  Horseradish.  Spicy and acidic foods, including peppers, chili powder, curry powder, vinegar, hot sauces, and barbecue sauce.  Citrus fruit juices and citrus fruits, such as oranges, lemons, and limes.  Tomato-based foods, such as red sauce, chili, salsa, and pizza with red sauce.  Fried and fatty foods, such as donuts, french fries, potato chips, and high-fat dressings.  High-fat meats, such as hot dogs and fatty cuts of red and white meats, such as rib eye steak, sausage, ham, and bacon.  High-fat dairy items, such as whole milk, butter, and cream cheese.  Eat small, frequent meals instead of large meals.  Avoid drinking large amounts of liquid with your meals.  Avoid eating meals during the 2-3 hours before bedtime.  Avoid lying down right after you eat.  Do not exercise right after you eat.  Avoid foods and drinks that seem to  make your symptoms worse. General Instructions  Pay attention to any changes in your symptoms.  Take over-the-counter and prescription medicines only as told by your health care provider. Do not take  aspirin, ibuprofen, or other NSAIDs unless your health care provider told you to do so.  If you have trouble taking pills, use a pill splitter to decrease the size of the pill. This will decrease the chance of the pill getting stuck or injuring your esophagus on the way down. Also, drink water after you take a pill.  Do not use any tobacco products, including cigarettes, chewing tobacco, and e-cigarettes. If you need help quitting, ask your health care provider.  Wear loose-fitting clothing. Do not wear anything tight around your waist that causes pressure on your abdomen.  Raise (elevate) the head of your bed about 6 inches (15 cm).  Try to reduce your stress, such as with yoga or meditation. If you need help reducing stress, ask your health care provider.  If you are overweight, reduce your weight to an amount that is healthy for you. Ask your health care provider for guidance about a safe weight loss goal.  Keep all follow-up visits as told by your health care provider. This is important. SEEK MEDICAL CARE IF:  You have new symptoms.  You have unexplained weight loss.  You have difficulty swallowing, or it hurts to swallow.  You have wheezing or a persistent cough.  Your symptoms do not improve with treatment.  You have frequent heartburn for more than two weeks. SEEK IMMEDIATE MEDICAL CARE IF:  You have severe pain in your arms, neck, jaw, teeth, or back.  You feel sweaty, dizzy, or light-headed.  You have chest pain or shortness of breath.  You vomit and your vomit looks like blood or coffee grounds.  Your stool is bloody or black.  You have a fever.  You cannot swallow, drink, or eat.   This information is not intended to replace advice given to you by your health care provider. Make sure you discuss any questions you have with your health care provider.   Document Released: 02/05/2004 Document Revised: 09/18/2014 Document Reviewed: 04/24/2014 Elsevier Interactive  Patient Education 2016 [employer]  Gastroesophageal Reflux Disease, Adult Normally, food travels down the esophagus and stays in the stomach to be digested. However, when a person has gastroesophageal reflux disease (GERD), food and stomach acid move back up into the esophagus. When this happens, the esophagus becomes sore and inflamed. Over time, GERD can create small holes (ulcers) in the lining of the esophagus.  CAUSES This condition is caused by a problem with the muscle between the esophagus and the stomach (lower esophageal sphincter, or LES). Normally, the LES muscle closes after food passes through the esophagus to the stomach. When the LES is weakened or abnormal, it does not close properly, and that allows food and stomach acid to go back up into the esophagus. The LES can be weakened by certain dietary substances, medicines, and medical conditions, including:  Tobacco use.  Pregnancy.  Having a hiatal hernia.  Heavy alcohol use.  Certain foods and beverages, such as coffee, chocolate, onions, and peppermint. RISK FACTORS This condition is more likely to develop in:  People who have an increased body weight.  People who have connective tissue disorders.  People who use NSAID medicines. SYMPTOMS Symptoms of this condition include:  Heartburn.  Difficult or painful swallowing.  The feeling of having a lump in the throat.  Abitter taste  in the mouth.  Bad breath.  Having a large amount of saliva.  Having an upset or bloated stomach.  Belching.  Chest pain.  Shortness of breath or wheezing.  Ongoing (chronic) cough or a night-time cough.  Wearing away of tooth enamel.  Weight loss. Different conditions can cause chest pain. Make sure to see your health care provider if you experience chest pain. DIAGNOSIS Your health care provider will take a medical history and perform a physical exam. To determine if you have mild or severe GERD, your health care  provider may also monitor how you respond to treatment. You may also have other tests, including:  An endoscopy toexamine your stomach and esophagus with a small camera.  A test thatmeasures the acidity level in your esophagus.  A test thatmeasures how much pressure is on your esophagus.  A barium swallow or modified barium swallow to show the shape, size, and functioning of your esophagus. TREATMENT The goal of treatment is to help relieve your symptoms and to prevent complications. Treatment for this condition may vary depending on how severe your symptoms are. Your health care provider may recommend:  Changes to your diet.  Medicine.  Surgery. HOME CARE INSTRUCTIONS Diet  Follow a diet as recommended by your health care provider. This may involve avoiding foods and drinks such as:  Coffee and tea (with or without caffeine).  Drinks that containalcohol.  Energy drinks and sports drinks.  Carbonated drinks or sodas.  Chocolate and cocoa.  Peppermint and mint flavorings.  Garlic and onions.  Horseradish.  Spicy and acidic foods, including peppers, chili powder, curry powder, vinegar, hot sauces, and barbecue sauce.  Citrus fruit juices and citrus fruits, such as oranges, lemons, and limes.  Tomato-based foods, such as red sauce, chili, salsa, and pizza with red sauce.  Fried and fatty foods, such as donuts, french fries, potato chips, and high-fat dressings.  High-fat meats, such as hot dogs and fatty cuts of red and white meats, such as rib eye steak, sausage, ham, and bacon.  High-fat dairy items, such as whole milk, butter, and cream cheese.  Eat small, frequent meals instead of large meals.  Avoid drinking large amounts of liquid with your meals.  Avoid eating meals during the 2-3 hours before bedtime.  Avoid lying down right after you eat.  Do not exercise right after you eat. General Instructions  Pay attention to any changes in your  symptoms.  Take over-the-counter and prescription medicines only as told by your health care provider. Do not take aspirin, ibuprofen, or other NSAIDs unless your health care provider told you to do so.  Do not use any tobacco products, including cigarettes, chewing tobacco, and e-cigarettes. If you need help quitting, ask your health care provider.  Wear loose-fitting clothing. Do not wear anything tight around your waist that causes pressure on your abdomen.  Raise (elevate) the head of your bed 6 inches (15cm).  Try to reduce your stress, such as with yoga or meditation. If you need help reducing stress, ask your health care provider.  If you are overweight, reduce your weight to an amount that is healthy for you. Ask your health care provider for guidance about a safe weight loss goal.  Keep all follow-up visits as told by your health care provider. This is important. SEEK MEDICAL CARE IF:  You have new symptoms.  You have unexplained weight loss.  You have difficulty swallowing, or it hurts to swallow.  You have wheezing or  a persistent cough.  Your symptoms do not improve with treatment.  You have a hoarse voice. SEEK IMMEDIATE MEDICAL CARE IF:  You have pain in your arms, neck, jaw, teeth, or back.  You feel sweaty, dizzy, or light-headed.  You have chest pain or shortness of breath.  You vomit and your vomit looks like blood or coffee grounds.  You faint.  Your stool is bloody or black.  You cannot swallow, drink, or eat.   This information is not intended to replace advice given to you by your health care provider. Make sure you discuss any questions you have with your health care provider.   Document Released: 10/07/2004 Document Revised: 09/18/2014 Document Reviewed: 04/24/2014 Elsevier Interactive Patient Education [employer]  Gonorrhea Testing Gonorrhea is a bacterial infection that spreads from person to person through sexual contact. It can  also spread from a mother to her baby during childbirth. You may be tested for gonorrhea if:  You have symptoms of gonorrhea.  You are pregnant or plan to get pregnant.  You have multiple sexual partners. There are three gonorrhea tests:  Nucleic acid amplification test. For this test, you will provide a urine sample or your health care provider will use a swab to collect a fluid sample from the area of possible infection.  Nucleic acid hybridization test. For this test, your health care provider will use a swab to collect a fluid sample from the penis or vagina.  Gonorrhea culture. For this test, your health care provider will use a swab to collect a sample of bodily fluid from the area of possible infection. This test may be done in cases of sexual assault for legal reasons. In all three methods of testing, the sample is sent to a laboratory for analysis. PREPARATION FOR TEST  Let your health care provider know if you are taking antibiotic medicine.  Do not use vaginal creams or douches.  Try to arrive with a full bladder. RESULTS It is your responsibility to obtain your test results. Ask the lab or department performing the test when and how you will get your results. Contact your health care provider to discuss any questions you have about your results. The results of your gonorrhea test will either be negative or positive. Meaning of Negative Test Results If your test result is negative, then it is most likely that you do not have the disease. Meaning of Positive Test Results If your test result is positive, you have an active gonorrhea infection. Notify any sexual partners so that they can also be tested.   This information is not intended to replace advice given to you by your health care provider. Make sure you discuss any questions you have with your health care provider.   Document Released: 01/30/2004 Document Revised: 01/18/2014 Document Reviewed: 04/02/2013 Elsevier  Interactive Patient Education 2016 [employer].  Safe Sex Safe sex is about reducing the risk of giving or getting a sexually transmitted disease (STD). STDs are spread through sexual contact involving the genitals, mouth, or rectum. Some STDs can be cured and others cannot. Safe sex can also prevent unintended pregnancies.  WHAT ARE SOME SAFE SEX PRACTICES?  Limit your sexual activity to only one partner who is having sex with only you.  Talk to your partner about his or her past partners, past STDs, and drug use.  Use a condom every time you have sexual intercourse. This includes vaginal, oral, and anal sexual activity. Both females and males should wear  condoms during oral sex. Only use latex or polyurethane condoms and water-based lubricants. Using petroleum-based lubricants or oils to lubricate a condom will weaken the condom and increase the chance that it will break. The condom should be in place from the beginning to the end of sexual activity. Wearing a condom reduces, but does not completely eliminate, your risk of getting or giving an STD. STDs can be spread by contact with infected body fluids and skin.  Get vaccinated for hepatitis B and HPV.  Avoid alcohol and recreational drugs, which can affect your judgment. You may forget to use a condom or participate in high-risk sex.  For females, avoid douching after sexual intercourse. Douching can spread an infection farther into the reproductive tract.  Check your body for signs of sores, blisters, rashes, or unusual discharge. See your health care provider if you notice any of these signs.  Avoid sexual contact if you have symptoms of an infection or are being treated for an STD. If you or your partner has herpes, avoid sexual contact when blisters are present. Use condoms at all other times.  If you are at risk of being infected with HIV, it is recommended that you take a prescription medicine daily to prevent HIV infection. This is  called pre-exposure prophylaxis (PrEP). You are considered at risk if:  You are a man who has sex with other men (MSM).  You are a heterosexual man or woman who is sexually active with more than one partner.  You take drugs by injection.  You are sexually active with a partner who has HIV.  Talk with your health care provider about whether you are at high risk of being infected with HIV. If you choose to begin PrEP, you should first be tested for HIV. You should then be tested every 3 months for as long as you are taking PrEP.  See your health care provider for regular screenings, exams, and tests for other STDs. Before having sex with a new partner, each of you should be screened for STDs and should talk about the results with each other. WHAT ARE THE BENEFITS OF SAFE SEX?   There is less chance of getting or giving an STD.  You can prevent unwanted or unintended pregnancies.  By discussing safe sex concerns with your partner, you may increase feelings of intimacy, comfort, trust, and honesty between the two of you.   This information is not intended to replace advice given to you by your health care provider. Make sure you discuss any questions you have with your health care provider.   Document Released: 02/05/2004 Document Revised: 01/18/2014 Document Reviewed: 06/21/2011 Elsevier Interactive Patient Education 2016 [employer].  Sexually Transmitted Disease A sexually transmitted disease (STD) is a disease or infection that may be passed (transmitted) from person to person, usually during sexual activity. This may happen by way of saliva, semen, blood, vaginal mucus, or urine. Common STDs include:  Gonorrhea.  Chlamydia.  Syphilis.  HIV and AIDS.  Genital herpes.  Hepatitis B and C.  Trichomonas.  Human papillomavirus (HPV).  Pubic lice.  Scabies.  Mites.  Bacterial vaginosis. WHAT ARE CAUSES OF STDs? An STD may be caused by bacteria, a virus, or parasites.  STDs are often transmitted during sexual activity if one person is infected. However, they may also be transmitted through nonsexual means. STDs may be transmitted after:   Sexual intercourse with an infected person.  Sharing sex toys with an infected person.  Sharing needles  with an infected person or using unclean piercing or tattoo needles.  Having intimate contact with the genitals, mouth, or rectal areas of an infected person.  Exposure to infected fluids during birth. WHAT ARE THE SIGNS AND SYMPTOMS OF STDs? Different STDs have different symptoms. Some people may not have any symptoms. If symptoms are present, they may include:  Painful or bloody urination.  Pain in the pelvis, abdomen, vagina, anus, throat, or eyes.  A skin rash, itching, or irritation.  Growths, ulcerations, blisters, or sores in the genital and anal areas.  Abnormal vaginal discharge with or without bad odor.  Penile discharge in men.  Fever.  Pain or bleeding during sexual intercourse.  Swollen glands in the groin area.  Yellow skin and eyes (jaundice). This is seen with hepatitis.  Swollen testicles.  Infertility.  Sores and blisters in the mouth. HOW ARE STDs DIAGNOSED? To make a diagnosis, your health care provider may:  Take a medical history.  Perform a physical exam.  Take a sample of any discharge to examine.  Swab the throat, cervix, opening to the penis, rectum, or vagina for testing.  Test a sample of your first morning urine.  Perform blood tests.  Perform a Pap test, if this applies.  Perform a colposcopy.  Perform a laparoscopy. HOW ARE STDs TREATED? Treatment depends on the STD. Some STDs may be treated but not cured.  Chlamydia, gonorrhea, trichomonas, and syphilis can be cured with antibiotic medicine.  Genital herpes, hepatitis, and HIV can be treated, but not cured, with prescribed medicines. The medicines lessen symptoms.  Genital warts from HPV can be  treated with medicine or by freezing, burning (electrocautery), or surgery. Warts may come back.  HPV cannot be cured with medicine or surgery. However, abnormal areas may be removed from the cervix, vagina, or vulva.  If your diagnosis is confirmed, your recent sexual partners need treatment. This is true even if they are symptom-free or have a negative culture or evaluation. They should not have sex until their health care providers say it is okay.  Your health care provider may test you for infection again 3 months after treatment. HOW CAN I REDUCE MY RISK OF GETTING AN STD? Take these steps to reduce your risk of getting an STD:  Use latex condoms, dental dams, and water-soluble lubricants during sexual activity. Do not use petroleum jelly or oils.  Avoid having multiple sex partners.  Do not have sex with someone who has other sex partners  Do not have sex with anyone you do not know or who is at high risk for an STD.  Avoid risky sex practices that can break your skin.  Do not have sex if you have open sores on your mouth or skin.  Avoid drinking too much alcohol or taking illegal drugs. Alcohol and drugs can affect your judgment and put you in a vulnerable position.  Avoid engaging in oral and anal sex acts.  Get vaccinated for HPV and hepatitis. If you have not received these vaccines in the past, talk to your health care provider about whether one or both might be right for you.  If you are at risk of being infected with HIV, it is recommended that you take a prescription medicine daily to prevent HIV infection. This is called pre-exposure prophylaxis (PrEP). You are considered at risk if:  You are a man who has sex with other men (MSM).  You are a heterosexual man or woman and are sexually active  with more than one partner.  You take drugs by injection.  You are sexually active with a partner who has HIV.  Talk with your health care provider about whether you are at  high risk of being infected with HIV. If you choose to begin PrEP, you should first be tested for HIV. You should then be tested every 3 months for as long as you are taking PrEP. WHAT SHOULD I DO IF I THINK I HAVE AN STD?  See your health care provider.  Tell your sexual partner(s). They should be tested and treated for any STDs.  Do not have sex until your health care provider says it is okay. WHEN SHOULD I GET IMMEDIATE MEDICAL CARE? Contact your health care provider right away if:   You have severe abdominal pain.  You are a man and notice swelling or pain in your testicles.  You are a woman and notice swelling or pain in your vagina.   This information is not intended to replace advice given to you by your health care provider. Make sure you discuss any questions you have with your health care provider.   Document Released: 03/20/2002 Document Revised: 01/18/2014 Document Reviewed: 07/18/2012 Elsevier Interactive Patient Education [employer]

## 2015-05-27 ENCOUNTER — Ambulatory Visit (HOSPITAL_COMMUNITY)
Admission: EM | Admit: 2015-05-27 | Discharge: 2015-05-27 | Disposition: A | Discharge status: Home / Self Care | Payer: Self-pay | Attending: Family Medicine | Admitting: Family Medicine

## 2015-05-27 ENCOUNTER — Encounter (HOSPITAL_COMMUNITY): Payer: Self-pay | Admitting: Emergency Medicine

## 2015-05-27 DIAGNOSIS — Z202 Contact with and (suspected) exposure to infections with a predominantly sexual mode of transmission: Secondary | ICD-10-CM | POA: Insufficient documentation

## 2015-05-27 DIAGNOSIS — Z888 Allergy status to other drugs, medicaments and biological substances status: Secondary | ICD-10-CM | POA: Insufficient documentation

## 2015-05-27 MED ORDER — CEFTRIAXONE SODIUM 250 MG IJ SOLR
250.0000 mg | Freq: Once | INTRAMUSCULAR | Status: AC
  Administered 2015-05-27: 250 mg via INTRAMUSCULAR

## 2015-05-27 MED ORDER — LIDOCAINE HCL (PF) 1 % IJ SOLN
INTRAMUSCULAR | Status: AC
  Filled 2015-05-27: qty 5

## 2015-05-27 MED ORDER — AZITHROMYCIN 250 MG PO TABS
ORAL_TABLET | ORAL | Status: AC
  Filled 2015-05-27: qty 4

## 2015-05-27 MED ORDER — AZITHROMYCIN 250 MG PO TABS
1000.0000 mg | ORAL_TABLET | Freq: Once | ORAL | Status: AC
  Administered 2015-05-27: 1000 mg via ORAL

## 2015-05-27 MED ORDER — CEFTRIAXONE SODIUM 250 MG IJ SOLR
INTRAMUSCULAR | Status: AC
  Filled 2015-05-27: qty 250

## 2015-05-27 MED ORDER — METRONIDAZOLE 500 MG PO TABS: 500.0000 mg | ORAL_TABLET | Freq: Two times a day (BID) | ORAL | Status: DC

## 2015-05-27 NOTE — ED Provider Notes (Signed)
CSN: [id number]     Arrival date & time 05/27/15  1547 History   First MD Initiated Contact with Patient 05/27/15 1746     Chief Complaint  Patient presents with  . Exposure to STD   (Consider location/radiation/quality/duration/timing/severity/associated sxs/prior Treatment) HPI Comments: 25 year old male is here for STD treatment. He states he is completely asymptomatic however he and his wife was involved in  Sexual Activity with another couple recently and his wife is symptomatic.   Past Medical History  Diagnosis Date  . ADHD (attention deficit hyperactivity disorder)   . Anxiety    History reviewed. No pertinent past surgical history. No family history on file. Social History  Substance Use Topics  . Smoking status: Never Smoker   . Smokeless tobacco: None  . Alcohol Use: 4.2 oz/week    6 Cans of beer, 1 Shots of liquor per week     Comment: reports binge drinking    Review of Systems  Constitutional: Negative.   Respiratory: Negative.   Gastrointestinal: Negative.   Genitourinary: Negative.  Negative for dysuria, frequency, flank pain, discharge, penile swelling, scrotal swelling, penile pain and testicular pain.  Musculoskeletal: Negative.   All other systems reviewed and are negative.   Allergies  Lorazepam  Home Medications   Prior to Admission medications   Medication Sig Start Date End Date Taking? Authorizing Provider  metroNIDAZOLE (FLAGYL) 500 MG tablet Take 1 tablet (500 mg total) by mouth 2 (two) times daily. X 7 days 05/27/15   Hayden Rasmussenavid Nahsir Venezia, NP  omeprazole (PRILOSEC) 20 MG capsule Take 1 capsule (20 mg total) by mouth 2 (two) times daily. 05/16/15   [name], MD   Meds Ordered and Administered this Visit   Medications  cefTRIAXone (ROCEPHIN) injection 250 mg (not administered)  azithromycin (ZITHROMAX) tablet 1,000 mg (not administered)    BP 126/56 mmHg  Pulse 60  Temp(Src) 98.4 F (36.9 C) (Oral)  Resp 12  SpO2 99% No data  found.   Physical Exam  Constitutional: He is oriented to person, place, and time. He appears well-developed and well-nourished. No distress.  Eyes: EOM are normal.  Neck: Normal range of motion. Neck supple.  Cardiovascular: Normal rate.   Pulmonary/Chest: Effort normal. No respiratory distress.  Musculoskeletal: He exhibits no edema.  Neurological: He is alert and oriented to person, place, and time. He exhibits normal muscle tone.  Skin: Skin is warm and dry.  Psychiatric: He has a normal mood and affect.  Nursing note and vitals reviewed.   ED Course  Procedures (including critical care time)  Labs Review Labs Reviewed  URINE CYTOLOGY ANCILLARY ONLY    Imaging Review No results found.   Visual Acuity Review  Right Eye Distance:   Left Eye Distance:   Bilateral Distance:    Right Eye Near:   Left Eye Near:    Bilateral Near:         MDM   1. Exposure to sexually transmitted disease (STD)    Meds ordered this encounter  Medications  . cefTRIAXone (ROCEPHIN) injection 250 mg    Sig:   . azithromycin (ZITHROMAX) tablet 1,000 mg    Sig:   . metroNIDAZOLE (FLAGYL) 500 MG tablet    Sig: Take 1 tablet (500 mg total) by mouth 2 (two) times daily. X 7 days    Dispense:  14 tablet    Refill:  0    Order Specific Question:  Supervising Provider    Answer:  Bradd CanaryKINDL, JAMES D [id number]]  Urine cytology pending   Hayden Rasmussen, NP 05/27/15 1907

## 2015-05-27 NOTE — Discharge Instructions (Signed)
Sexually Transmitted Disease  A sexually transmitted disease (STD) is a disease or infection that may be passed (transmitted) from person to person, usually during sexual activity. This may happen by way of saliva, semen, blood, vaginal mucus, or urine. Common STDs include:  · Gonorrhea.  · Chlamydia.  · Syphilis.  · HIV and AIDS.  · Genital herpes.  · Hepatitis B and C.  · Trichomonas.  · Human papillomavirus (HPV).  · Pubic lice.  · Scabies.  · Mites.  · Bacterial vaginosis.  WHAT ARE CAUSES OF STDs?  An STD may be caused by bacteria, a virus, or parasites. STDs are often transmitted during sexual activity if one person is infected. However, they may also be transmitted through nonsexual means. STDs may be transmitted after:   · Sexual intercourse with an infected person.  · Sharing sex toys with an infected person.  · Sharing needles with an infected person or using unclean piercing or tattoo needles.  · Having intimate contact with the genitals, mouth, or rectal areas of an infected person.  · Exposure to infected fluids during birth.  WHAT ARE THE SIGNS AND SYMPTOMS OF STDs?  Different STDs have different symptoms. Some people may not have any symptoms. If symptoms are present, they may include:  · Painful or bloody urination.  · Pain in the pelvis, abdomen, vagina, anus, throat, or eyes.  · A skin rash, itching, or irritation.  · Growths, ulcerations, blisters, or sores in the genital and anal areas.  · Abnormal vaginal discharge with or without bad odor.  · Penile discharge in men.  · Fever.  · Pain or bleeding during sexual intercourse.  · Swollen glands in the groin area.  · Yellow skin and eyes (jaundice). This is seen with hepatitis.  · Swollen testicles.  · Infertility.  · Sores and blisters in the mouth.  HOW ARE STDs DIAGNOSED?  To make a diagnosis, your health care provider may:  · Take a medical history.  · Perform a physical exam.  · Take a sample of any discharge to examine.  · Swab the throat,  cervix, opening to the penis, rectum, or vagina for testing.  · Test a sample of your first morning urine.  · Perform blood tests.  · Perform a Pap test, if this applies.  · Perform a colposcopy.  · Perform a laparoscopy.  HOW ARE STDs TREATED?  Treatment depends on the STD. Some STDs may be treated but not cured.  · Chlamydia, gonorrhea, trichomonas, and syphilis can be cured with antibiotic medicine.  · Genital herpes, hepatitis, and HIV can be treated, but not cured, with prescribed medicines. The medicines lessen symptoms.  · Genital warts from HPV can be treated with medicine or by freezing, burning (electrocautery), or surgery. Warts may come back.  · HPV cannot be cured with medicine or surgery. However, abnormal areas may be removed from the cervix, vagina, or vulva.  · If your diagnosis is confirmed, your recent sexual partners need treatment. This is true even if they are symptom-free or have a negative culture or evaluation. They should not have sex until their health care providers say it is okay.  · Your health care provider may test you for infection again 3 months after treatment.  HOW CAN I REDUCE MY RISK OF GETTING AN STD?  Take these steps to reduce your risk of getting an STD:  · Use latex condoms, dental dams, and water-soluble lubricants during sexual activity. Do not use   petroleum jelly or oils.  · Avoid having multiple sex partners.  · Do not have sex with someone who has other sex partners  · Do not have sex with anyone you do not know or who is at high risk for an STD.  · Avoid risky sex practices that can break your skin.  · Do not have sex if you have open sores on your mouth or skin.  · Avoid drinking too much alcohol or taking illegal drugs. Alcohol and drugs can affect your judgment and put you in a vulnerable position.  · Avoid engaging in oral and anal sex acts.  · Get vaccinated for HPV and hepatitis. If you have not received these vaccines in the past, talk to your health care  provider about whether one or both might be right for you.  · If you are at risk of being infected with HIV, it is recommended that you take a prescription medicine daily to prevent HIV infection. This is called pre-exposure prophylaxis (PrEP). You are considered at risk if:    You are a man who has sex with other men (MSM).    You are a heterosexual man or woman and are sexually active with more than one partner.    You take drugs by injection.    You are sexually active with a partner who has HIV.  · Talk with your health care provider about whether you are at high risk of being infected with HIV. If you choose to begin PrEP, you should first be tested for HIV. You should then be tested every 3 months for as long as you are taking PrEP.  WHAT SHOULD I DO IF I THINK I HAVE AN STD?  · See your health care provider.  · Tell your sexual partner(s). They should be tested and treated for any STDs.  · Do not have sex until your health care provider says it is okay.  WHEN SHOULD I GET IMMEDIATE MEDICAL CARE?  Contact your health care provider right away if:   · You have severe abdominal pain.  · You are a man and notice swelling or pain in your testicles.  · You are a woman and notice swelling or pain in your vagina.     This information is not intended to replace advice given to you by your health care provider. Make sure you discuss any questions you have with your health care provider.     Document Released: 03/20/2002 Document Revised: 01/18/2014 Document Reviewed: 07/18/2012  Elsevier Interactive Patient Education ©2016 [employer]    Safe Sex  Safe sex is about reducing the risk of giving or getting a sexually transmitted disease (STD). STDs are spread through sexual contact involving the genitals, mouth, or rectum. Some STDs can be cured and others cannot. Safe sex can also prevent unintended pregnancies.   WHAT ARE SOME SAFE SEX PRACTICES?  · Limit your sexual activity to only one partner who is having sex with  only you.  · Talk to your partner about his or her past partners, past STDs, and drug use.  · Use a condom every time you have sexual intercourse. This includes vaginal, oral, and anal sexual activity. Both females and males should wear condoms during oral sex. Only use latex or polyurethane condoms and water-based lubricants. Using petroleum-based lubricants or oils to lubricate a condom will weaken the condom and increase the chance that it will break. The condom should be in place from the beginning to   the end of sexual activity. Wearing a condom reduces, but does not completely eliminate, your risk of getting or giving an STD. STDs can be spread by contact with infected body fluids and skin.  · Get vaccinated for hepatitis B and HPV.  · Avoid alcohol and recreational drugs, which can affect your judgment. You may forget to use a condom or participate in high-risk sex.  · For females, avoid douching after sexual intercourse. Douching can spread an infection farther into the reproductive tract.  · Check your body for signs of sores, blisters, rashes, or unusual discharge. See your health care provider if you notice any of these signs.  · Avoid sexual contact if you have symptoms of an infection or are being treated for an STD. If you or your partner has herpes, avoid sexual contact when blisters are present. Use condoms at all other times.  · If you are at risk of being infected with HIV, it is recommended that you take a prescription medicine daily to prevent HIV infection. This is called pre-exposure prophylaxis (PrEP). You are considered at risk if:    You are a man who has sex with other men (MSM).    You are a heterosexual man or woman who is sexually active with more than one partner.    You take drugs by injection.    You are sexually active with a partner who has HIV.  · Talk with your health care provider about whether you are at high risk of being infected with HIV. If you choose to begin PrEP, you  should first be tested for HIV. You should then be tested every 3 months for as long as you are taking PrEP.  · See your health care provider for regular screenings, exams, and tests for other STDs. Before having sex with a new partner, each of you should be screened for STDs and should talk about the results with each other.  WHAT ARE THE BENEFITS OF SAFE SEX?   · There is less chance of getting or giving an STD.  · You can prevent unwanted or unintended pregnancies.  · By discussing safe sex concerns with your partner, you may increase feelings of intimacy, comfort, trust, and honesty between the two of you.     This information is not intended to replace advice given to you by your health care provider. Make sure you discuss any questions you have with your health care provider.     Document Released: 02/05/2004 Document Revised: 01/18/2014 Document Reviewed: 06/21/2011  Elsevier Interactive Patient Education ©2016 [employer] Inc[employer]

## 2015-05-27 NOTE — ED Notes (Addendum)
Patient reports no symptoms.  Reports partner has symptoms.  Patient reports having sexual encounter with multiple couples and his regular partner has symptoms of std.

## 2015-05-28 LAB — URINE CYTOLOGY ANCILLARY ONLY
Chlamydia: NEGATIVE
Neisseria Gonorrhea: NEGATIVE
Trichomonas: NEGATIVE

## 2015-05-30 ENCOUNTER — Telehealth (HOSPITAL_COMMUNITY): Payer: Self-pay | Admitting: Emergency Medicine

## 2015-05-30 NOTE — ED Notes (Signed)
Called pt and notified of recent lab results from visit 5/16 Pt ID'd properly... States he will be back here to get checked for HIV; [facility] but he insisted he would much rather come here.   Per Dr. Dayton ScrapeMurray,  Please let patient know that tests for gonorrhea/chlamydia/trichomonas were negative. Recheck as needed. LM  Adv pt if sx are not getting better to return  Pt verb understanding Education on safe sex given

## 2015-12-14 ENCOUNTER — Emergency Department (HOSPITAL_COMMUNITY)
Admission: EM | Admit: 2015-12-14 | Discharge: 2015-12-14 | Disposition: A | Discharge status: Home / Self Care | Payer: Self-pay | Attending: Emergency Medicine | Admitting: Emergency Medicine

## 2015-12-14 ENCOUNTER — Encounter (HOSPITAL_COMMUNITY): Payer: Self-pay

## 2015-12-14 ENCOUNTER — Emergency Department (HOSPITAL_COMMUNITY): Payer: Self-pay

## 2015-12-14 DIAGNOSIS — R0609 Other forms of dyspnea: Secondary | ICD-10-CM

## 2015-12-14 DIAGNOSIS — F909 Attention-deficit hyperactivity disorder, unspecified type: Secondary | ICD-10-CM | POA: Insufficient documentation

## 2015-12-14 DIAGNOSIS — K625 Hemorrhage of anus and rectum: Secondary | ICD-10-CM

## 2015-12-14 DIAGNOSIS — Z79899 Other long term (current) drug therapy: Secondary | ICD-10-CM | POA: Insufficient documentation

## 2015-12-14 LAB — COMPREHENSIVE METABOLIC PANEL
ALK PHOS: 55 U/L (ref 38–126)
ALT: 20 U/L (ref 17–63)
ANION GAP: 11 (ref 5–15)
AST: 26 U/L (ref 15–41)
Albumin: 3.7 g/dL (ref 3.5–5.0)
BUN: 14 mg/dL (ref 6–20)
CALCIUM: 9.5 mg/dL (ref 8.9–10.3)
CO2: 24 mmol/L (ref 22–32)
Chloride: 104 mmol/L (ref 101–111)
Creatinine, Ser: 0.91 mg/dL (ref 0.61–1.24)
Glucose, Bld: 85 mg/dL (ref 65–99)
Potassium: 3.9 mmol/L (ref 3.5–5.1)
SODIUM: 139 mmol/L (ref 135–145)
TOTAL PROTEIN: 7.8 g/dL (ref 6.5–8.1)
Total Bilirubin: 0.2 mg/dL — ABNORMAL LOW (ref 0.3–1.2)

## 2015-12-14 LAB — CBC WITH DIFFERENTIAL/PLATELET
BASOS ABS: 0 10*3/uL (ref 0.0–0.1)
Basophils Relative: 0 %
EOS ABS: 0.2 10*3/uL (ref 0.0–0.7)
EOS PCT: 4 %
HCT: 37.2 % — ABNORMAL LOW (ref 39.0–52.0)
Hemoglobin: 12.6 g/dL — ABNORMAL LOW (ref 13.0–17.0)
Lymphocytes Relative: 40 %
Lymphs Abs: 1.6 10*3/uL (ref 0.7–4.0)
MCH: 28.1 pg (ref 26.0–34.0)
MCHC: 33.9 g/dL (ref 30.0–36.0)
MCV: 83 fL (ref 78.0–100.0)
Monocytes Absolute: 0.4 10*3/uL (ref 0.1–1.0)
Monocytes Relative: 10 %
Neutro Abs: 1.8 10*3/uL (ref 1.7–7.7)
Neutrophils Relative %: 46 %
PLATELETS: 220 10*3/uL (ref 150–400)
RBC: 4.48 MIL/uL (ref 4.22–5.81)
RDW: 13.7 % (ref 11.5–15.5)
WBC: 3.9 10*3/uL — AB (ref 4.0–10.5)

## 2015-12-14 LAB — I-STAT TROPONIN, ED: TROPONIN I, POC: 0 ng/mL (ref 0.00–0.08)

## 2015-12-14 LAB — PROTIME-INR
INR: 1.03
PROTHROMBIN TIME: 13.6 s (ref 11.4–15.2)

## 2015-12-14 LAB — POC OCCULT BLOOD, ED: FECAL OCCULT BLD: POSITIVE — AB

## 2015-12-14 LAB — BRAIN NATRIURETIC PEPTIDE: B NATRIURETIC PEPTIDE 5: 4.9 pg/mL (ref 0.0–100.0)

## 2015-12-14 NOTE — ED Notes (Signed)
Patient transported to X-ray 

## 2015-12-14 NOTE — ED Notes (Signed)
Pt walked with this RN around pods D, C, B, and half of A. The pts oxygen saturation level stayed above 98% the entire length of the walk. The pt walked without difficulty or complaint.

## 2015-12-14 NOTE — ED Provider Notes (Signed)
[facility]-EMERGENCY DEPT Provider Note   CSN: [id number] Arrival date & time: 12/14/15  [id number]     History   Chief Complaint Chief Complaint  Patient presents with  . Shortness of Breath  . Rectal Bleeding    HPI Thomas Vaughn is a 25 y.o. male who presents emergency Department with chief complaint of rectal bleeding and shortness of breath. The patient states that for the past 3 months he has had exertional dyspnea with chest tightness. Patient states that he walks about a half mile daily to take his step-child to school. He states that during the walk, he has to stop several times because of shortness of breath, chest tightness and central retrosternal chest pain which is described as achy, but resolves with rest. He states that this has been going on for about 3 months. He's had multiple emergency department visits for chest pain that have been negative. He states that 8 years ago he had an irregular heart rhythm and were Holter monitor, had an echocardiogram that were negative. He denies cough, wheezing. He states that his aunt gave him an inhaler that only gave him mild improvement in his symptoms. He does not smoke, use illicit drugs or marijuana. He states that he used to, but he quit and doesn't do it anymore. He admits to rare and occasional alcohol use. He states that he had 2 glasses of white wine last night. Patient also complains of rectal bleeding. This morning he states he went to have a bowel movement and when he got up to wipe his bottom noticed that the toilet was full of red blood. He denies any rectal pain, abdominal pain, history of a previous rectal bleeding episodes, diarrhea, recent illnesses or hemorrhoids.   HPI  Past Medical History:  Diagnosis Date  . ADHD (attention deficit hyperactivity disorder)   . Anxiety     Patient Active Problem List   Diagnosis Date Noted  . ADHD (attention deficit hyperactivity disorder)   . HYPOKALEMIA 07/29/2009  . ANEMIA OF OTHER  CHRONIC DISEASE 07/29/2009  . GYNECOMASTIA 07/29/2009  . CHEST PAIN UNSPECIFIED 07/29/2009    History reviewed. No pertinent surgical history.     Home Medications    Prior to Admission medications   Medication Sig Start Date End Date Taking? Authorizing Provider  metroNIDAZOLE (FLAGYL) 500 MG tablet Take 1 tablet (500 mg total) by mouth 2 (two) times daily. X 7 days 05/27/15   [name], NP  omeprazole (PRILOSEC) 20 MG capsule Take 1 capsule (20 mg total) by mouth 2 (two) times daily. 05/16/15   [name], MD    Family History No family history on file.  Social History Social History  Substance Use Topics  . Smoking status: Never Smoker  . Smokeless tobacco: Never Used  . Alcohol use 4.2 oz/week    6 Cans of beer, 1 Shots of liquor per week     Comment: reports binge drinking - rarely      Allergies   Lorazepam   Review of Systems Review of Systems  Ten systems reviewed and are negative for acute change, except as noted in the HPI.   Physical Exam Updated Vital Signs BP 133/75   Pulse 78   Temp 98.6 F (37 C) (Oral)   Resp 15   Ht 5\' 6"  (1.676 m)   Wt 70.3 kg   SpO2 98%   BMI 25.02 kg/m   Physical Exam  Constitutional: He appears well-developed and well-nourished. No distress.  HENT:  Head: Normocephalic and atraumatic.  Eyes: Conjunctivae are normal. No scleral icterus.  Neck: Normal range of motion. Neck supple.  Cardiovascular: Normal rate, regular rhythm and normal heart sounds.   Pulmonary/Chest: Effort normal and breath sounds normal. No respiratory distress. He exhibits no tenderness.  Abdominal: Soft. Bowel sounds are normal. He exhibits no distension. There is no tenderness.  Genitourinary:  Genitourinary Comments: Digital Rectal Exam reveals sphincter with good tone. Non thrombosed external hemorrhoid present with  miled irritatation and skin breakdown. No masses or fissures. Stool color is brown with no overt blood.  Musculoskeletal: He  exhibits no edema.  Neurological: He is alert.  Skin: Skin is warm and dry. He is not diaphoretic.  Psychiatric: His behavior is normal.  Nursing note and vitals reviewed.    ED Treatments / Results  Labs (all labs ordered are listed, but only abnormal results are displayed) Labs Reviewed  POC OCCULT BLOOD, ED    EKG  EKG Interpretation  Date/Time:  Sunday December 14 2015 07:39:17 EST Ventricular Rate:  83 PR Interval:    QRS Duration: 94 QT Interval:  368 QTC Calculation: 433 R Axis:   87 Text Interpretation:  Sinus arrhythmia Ventricular premature complex No significant change since last tracing Confirmed by LITTLE MD, RACHEL ([id number]) on 12/14/2015 10:30:22 AM       Radiology No results found.  Procedures Procedures (including critical care time)  Medications Ordered in ED Medications - No data to display   Initial Impression / Assessment and Plan / ED Course  I have reviewed the triage vital signs and the nursing notes.  Pertinent labs & imaging results that were available during my care of the patient were reviewed by me and considered in my medical decision making (see chart for details).  Clinical Course     Patient with normal hemoglobin. Patient was walked around the emergency department several times without shortness of breath or hypoxia. No exertional anginal symptoms or equivalent. EKG without abnormality, negative troponin, negative BNP, negative chest x-ray. The patient does not appear to have an emergent cause of his symptoms. The patient is safe for discharge at this time. I have advised to follow-up with the cardiology group for a stress test. Patient is also advised to follow up with gastroenterology regarding his rectal bleeding. I suspect internal hemorrhoids. He is safe for discharge at this time Final Clinical Impressions(s) / ED Diagnoses   Final diagnoses:  Dyspnea on exertion  Rectal bleeding    New Prescriptions New Prescriptions    No medications on file     Arthor Captainbigail Marvelle Span, PA-C 12/14/15 1621    Laurence Spatesachel Morgan Little, MD 12/14/15 [phone]

## 2015-12-14 NOTE — ED Notes (Addendum)
Abigail PA ay bedside

## 2015-12-14 NOTE — ED Notes (Signed)
Patient undressed, in gown, on monitor, continuous pulse oximetry and blood pressure cuff 

## 2015-12-14 NOTE — ED Notes (Addendum)
Pt reported that he vomited, pt has an emesis bag sputum observed in the emesis bag.

## 2015-12-14 NOTE — ED Triage Notes (Signed)
Per [facility]: Pt c/o sob since 0600. Lung sounds clear bilaterally. Pt also c/o nausea. Has reported "dark red stools" just prior to calling for EMS.

## 2015-12-14 NOTE — Discharge Instructions (Signed)
There was no cause of your breathing difficulty found today. You should follow up with the cardiologist for a stress test. You appear to have an external hemorrhoid, which means you likely have internal hemorrhoids that are bleeding. You may use over the counter meds like tucks or hemorrhoid suppositories. The gastroenterologist regarding the bleeding.

## 2015-12-24 ENCOUNTER — Emergency Department (HOSPITAL_COMMUNITY): Payer: Self-pay

## 2015-12-24 ENCOUNTER — Encounter (HOSPITAL_COMMUNITY): Payer: Self-pay | Admitting: *Deleted

## 2015-12-24 ENCOUNTER — Emergency Department (HOSPITAL_COMMUNITY)
Admission: EM | Admit: 2015-12-24 | Discharge: 2015-12-24 | Disposition: A | Discharge status: Home / Self Care | Payer: Self-pay | Attending: Emergency Medicine | Admitting: Emergency Medicine

## 2015-12-24 DIAGNOSIS — R0789 Other chest pain: Secondary | ICD-10-CM | POA: Insufficient documentation

## 2015-12-24 DIAGNOSIS — K921 Melena: Secondary | ICD-10-CM

## 2015-12-24 DIAGNOSIS — K649 Unspecified hemorrhoids: Secondary | ICD-10-CM | POA: Insufficient documentation

## 2015-12-24 DIAGNOSIS — F909 Attention-deficit hyperactivity disorder, unspecified type: Secondary | ICD-10-CM | POA: Insufficient documentation

## 2015-12-24 LAB — LIPASE, BLOOD: Lipase: 16 U/L (ref 11–51)

## 2015-12-24 LAB — CBC
HEMATOCRIT: 41 % (ref 39.0–52.0)
HEMOGLOBIN: 13.6 g/dL (ref 13.0–17.0)
MCH: 28 pg (ref 26.0–34.0)
MCHC: 33.2 g/dL (ref 30.0–36.0)
MCV: 84.5 fL (ref 78.0–100.0)
PLATELETS: 285 10*3/uL (ref 150–400)
RBC: 4.85 MIL/uL (ref 4.22–5.81)
RDW: 14.1 % (ref 11.5–15.5)
WBC: 6.6 10*3/uL (ref 4.0–10.5)

## 2015-12-24 LAB — COMPREHENSIVE METABOLIC PANEL
ALBUMIN: 4.4 g/dL (ref 3.5–5.0)
ALK PHOS: 51 U/L (ref 38–126)
ALT: 26 U/L (ref 17–63)
AST: 32 U/L (ref 15–41)
Anion gap: 15 (ref 5–15)
BUN: 10 mg/dL (ref 6–20)
CALCIUM: 9.5 mg/dL (ref 8.9–10.3)
CO2: 23 mmol/L (ref 22–32)
CREATININE: 0.88 mg/dL (ref 0.61–1.24)
Chloride: 100 mmol/L — ABNORMAL LOW (ref 101–111)
GFR calc Af Amer: >60 mL/min (ref >60)
GFR calc non Af Amer: >60 mL/min (ref >60)
GLUCOSE: 115 mg/dL — AB (ref 65–99)
Potassium: 3.9 mmol/L (ref 3.5–5.1)
Sodium: 138 mmol/L (ref 135–145)
Total Bilirubin: 0.5 mg/dL (ref 0.3–1.2)
Total Protein: 8.6 g/dL — ABNORMAL HIGH (ref 6.5–8.1)

## 2015-12-24 LAB — I-STAT TROPONIN, ED: Troponin i, poc: 0 ng/mL (ref 0.00–0.08)

## 2015-12-24 LAB — POC OCCULT BLOOD, ED: Fecal Occult Bld: NEGATIVE

## 2015-12-24 MED ORDER — GI COCKTAIL ~~LOC~~
30.0000 mL | Freq: Once | ORAL | Status: AC
  Administered 2015-12-24: 30 mL via ORAL
  Filled 2015-12-24: qty 30

## 2015-12-24 NOTE — ED Provider Notes (Addendum)
[facility]-EMERGENCY DEPT Provider Note   CSN: [id number] Arrival date & time: 12/24/15  1511  By signing my name below, I, Thomas Vaughn, attest that this documentation has been prepared under the direction and in the presence of Thomas ConnPedro Eduardo Cardama, MD.  Electronically Signed: Octavia HeirArianna Vaughn, ED Scribe. 12/24/15. 4:33 PM.    History   Chief Complaint Chief Complaint  Patient presents with  . Chest Pain  . Blood In Stools     The history is provided by the patient. No language interpreter was used.   HPI Comments: Thomas Vaughn is a 25 y.o. male who has a PMhx of anxiety, hypokalemia, and ADHD presents to the Emergency Department complaining of non-radiating central chest pain that he describes as pressure like since 7 am. Pt says his pain was constant for about 6 hours and now it is intermittent. Pt reports that he was drinking last night. He notes waking up with a mild headache and having central chest pressure. He says he had associated nausea, one episode of vomiting, dry cough x 1 week and blood in stool. Pt has had this pain before in the past but not this severe. He says that every time he comes to the ED for his care and receives Malox that alleviates his pain.  He has not taken any medication to relieve his pain. He denies leg swelling, hx of DVT, prolonged travel, hormonal replacement therapy, fever, abdominal pain.  Pt also complains of blood in stool that was noticed this morning. He reports having bright red blood and dark red blood in the toilet this morning. Pt notes having similar symptoms in the past but none that was this severe. Pt does not have a hx of known hemorrhoids. He denies melena, family hx of polyps or family hx of colon cancer. Past Medical History:  Diagnosis Date  . ADHD (attention deficit hyperactivity disorder)   . Anxiety     Patient Active Problem List   Diagnosis Date Noted  . ADHD (attention deficit hyperactivity disorder)   . HYPOKALEMIA  07/29/2009  . ANEMIA OF OTHER CHRONIC DISEASE 07/29/2009  . GYNECOMASTIA 07/29/2009  . CHEST PAIN UNSPECIFIED 07/29/2009    History reviewed. No pertinent surgical history.     Home Medications    Prior to Admission medications   Medication Sig Start Date End Date Taking? Authorizing Provider  metroNIDAZOLE (FLAGYL) 500 MG tablet Take 1 tablet (500 mg total) by mouth 2 (two) times daily. X 7 days Patient not taking: Reported on 12/14/2015 05/27/15   Thomas Rasmussenavid Mabe, NP  omeprazole (PRILOSEC) 20 MG capsule Take 1 capsule (20 mg total) by mouth 2 (two) times daily. Patient not taking: Reported on 12/14/2015 05/16/15   Thomas PorterMark James, MD  PRESCRIPTION MEDICATION Take 1-2 puffs by mouth every 6 (six) hours as needed (sob). Per patient, this was a friends medication. Albuterol inhaler.    Historical Provider, MD    Family History History reviewed. No pertinent family history.  Social History Social History  Substance Use Topics  . Smoking status: Never Smoker  . Smokeless tobacco: Never Used  . Alcohol use 4.2 oz/week    6 Cans of beer, 1 Shots of liquor per week     Comment: reports binge drinking - rarely      Allergies   Lorazepam   Review of Systems Review of Systems  A complete 10 system review of systems was obtained and all systems are negative except as noted in the HPI and PMH.  Physical Exam Updated Vital Signs BP 110/75 (BP Location: Left Arm)   Pulse 93   Temp 99.1 F (37.3 C) (Oral)   Resp 18   SpO2 98%   Physical Exam  Constitutional: He is oriented to person, place, and time. He appears well-developed and well-nourished. No distress.  HENT:  Head: Normocephalic and atraumatic.  Nose: Nose normal.  Eyes: Conjunctivae and EOM are normal. Pupils are equal, round, and reactive to light. Right eye exhibits no discharge. Left eye exhibits no discharge. No scleral icterus.  Neck: Normal range of motion. Neck supple.  Cardiovascular: Normal rate and regular rhythm.   Exam reveals no gallop and no friction rub.   No murmur heard. Pulmonary/Chest: Effort normal and breath sounds normal. No stridor. No respiratory distress. He has no rales.  Abdominal: Soft. He exhibits no distension. There is no tenderness.  Genitourinary:  Genitourinary Comments: External hemorrhoid around 5:00. Does not appear thrombosed. Internal hemorrhoids around 9:00  Musculoskeletal: He exhibits no edema or tenderness.  Neurological: He is alert and oriented to person, place, and time.  Skin: Skin is warm and dry. No rash noted. He is not diaphoretic. No erythema.  Psychiatric: He has a normal mood and affect.  Vitals reviewed.    ED Treatments / Results  DIAGNOSTIC STUDIES: Oxygen Saturation is 98% on RA, normal by my interpretation.  COORDINATION OF CARE:  4:29 PM Discussed treatment plan which includes rectal exam with pt at bedside and pt agreed to plan.  Labs (all labs ordered are listed, but only abnormal results are displayed) Labs Reviewed  COMPREHENSIVE METABOLIC PANEL - Abnormal; Notable for the following:       Result Value   Chloride 100 (*)    Glucose, Bld 115 (*)    Total Protein 8.6 (*)    All other components within normal limits  CBC  LIPASE, BLOOD  I-STAT TROPOININ, ED  POC OCCULT BLOOD, ED    EKG  EKG Interpretation  Date/Time:  Wednesday December 24 2015 15:13:05 EST Ventricular Rate:  92 PR Interval:  162 QRS Duration: 88 QT Interval:  360 QTC Calculation: 445 R Axis:   95 Text Interpretation:  Sinus rhythm with marked sinus arrhythmia with Fusion complexes Rightward axis Borderline ECG No significant change since last tracing Confirmed by [facility] CenterCARDAMA MD, PEDRO ([id number]) on 12/24/2015 3:36:43 PM       Radiology Dg Chest 2 View  Result Date: 12/24/2015 CLINICAL DATA:  Left side chest pain, shortness of breath starting this morning EXAM: CHEST  2 VIEW COMPARISON:  12/14/2015 FINDINGS: The heart size and mediastinal contours are within  normal limits. Both lungs are clear. The visualized skeletal structures are unremarkable. IMPRESSION: No active cardiopulmonary disease. Electronically Signed   By: Thomas MeadLiviu  Vaughn M.D.   On: 12/24/2015 15:47    Procedures LOWER ENDOSCOPY Date/Time: 12/24/2015 4:38 PM Performed by: Thomas ConnARDAMA, PEDRO EDUARDO Authorized by: Thomas ConnARDAMA, PEDRO EDUARDO  Consent: Verbal consent obtained. Consent given by: patient Patient understanding: patient states understanding of the procedure being performed Patient identity confirmed: arm band Indications: hemorrhoids and rectal bleeding  Sedation: Patient sedated: no Scope type: anoscope External exam performed: yes Positive external exam findings: external hemorrhoids External hemorrhoid position: five o'clock Digital exam performed: yes Negative digital exam findings: no laxity of anal sphincter, no prostate tenderness, no prostate enlargement and no prostate nodules Positive internal exam findings: internal hemorrhoid Negative internal exam findings: no anal fissures Internal hemorrhoid position: nine o'clock Internal hemorrhoid prolapsed: no Procedure termination: procedure complete  Patient tolerance: Patient tolerated the procedure well with no immediate complications     (including critical care time)    Medications Ordered in ED Medications  gi cocktail (Maalox,Lidocaine,Donnatal) (30 mLs Oral Given 12/24/15 1644)     Initial Impression / Assessment and Plan / ED Course  I have reviewed the triage vital signs and the nursing notes.  Pertinent labs & imaging results that were available during my care of the patient were reviewed by me and considered in my medical decision making (see chart for details).  Clinical Course     Atypical chest pain that is highly consistent with ACS. EKG without acute ischemic changes or evidence of pericarditis. Triage labs revealed negative troponin. Grossly reassuring labs.Chest x-ray without evidence  suggestive of pneumonia, pneumothorax, pneumomediastinum.  No abnormal contour of the mediastinum to suggest dissection. No evidence of acute injuries. Low suspicion with pulmonary embolism. Presentation is classic for aortic dissection. No evidence suggesting esophageal perforation. Improved symptoms following GI cocktail. History and presentation most consistent with possible gastritis. Abdomen benign. Labs reassuring.  Rectal bleeding. Evidence consistent with internal and external hemorrhoids this is likely the cause of his bleeding. Supportive management discussed.    Final Clinical Impressions(s) / ED Diagnoses   Final diagnoses:  Blood in stool  Hemorrhoids, unspecified hemorrhoid type  Atypical chest pain   Disposition: Discharge  Condition: Good  I have discussed the results, Dx and Tx plan with the patient who expressed understanding and agree(s) with the plan. Discharge instructions discussed at great length. The patient was given strict return precautions who verbalized understanding of the instructions. No further questions at time of discharge.    Discharge Medication List as of 12/24/2015  5:02 PM      Follow Up: [facility] [address] [place] [address] [phone] Call  For help establishing care with a care provider   I personally performed the services described in this documentation, which was scribed in my presence. The recorded information has been reviewed and is accurate.          Thomas Conn, MD 12/24/15 2158

## 2015-12-24 NOTE — ED Triage Notes (Signed)
Pt reports mid chest heaviness and pressure this am. Denies recent cough. Also reports having blood in stools this morning. No acute distress noted at triage.

## 2015-12-24 NOTE — ED Notes (Signed)
Contacted lab to add on lipase 

## 2016-02-20 ENCOUNTER — Emergency Department (HOSPITAL_COMMUNITY)
Admission: EM | Admit: 2016-02-20 | Discharge: 2016-02-20 | Disposition: A | Discharge status: Home / Self Care | Payer: Self-pay | Attending: Emergency Medicine | Admitting: Emergency Medicine

## 2016-02-20 ENCOUNTER — Encounter (HOSPITAL_COMMUNITY): Payer: Self-pay

## 2016-02-20 ENCOUNTER — Emergency Department (HOSPITAL_COMMUNITY): Payer: Self-pay

## 2016-02-20 DIAGNOSIS — Z79899 Other long term (current) drug therapy: Secondary | ICD-10-CM | POA: Insufficient documentation

## 2016-02-20 DIAGNOSIS — R0602 Shortness of breath: Secondary | ICD-10-CM | POA: Insufficient documentation

## 2016-02-20 DIAGNOSIS — R002 Palpitations: Secondary | ICD-10-CM | POA: Insufficient documentation

## 2016-02-20 DIAGNOSIS — F909 Attention-deficit hyperactivity disorder, unspecified type: Secondary | ICD-10-CM | POA: Insufficient documentation

## 2016-02-20 LAB — I-STAT TROPONIN, ED: TROPONIN I, POC: 0 ng/mL (ref 0.00–0.08)

## 2016-02-20 LAB — I-STAT CHEM 8, ED
BUN: 12 mg/dL (ref 6–20)
CALCIUM ION: 1.15 mmol/L (ref 1.15–1.40)
Chloride: 101 mmol/L (ref 101–111)
Creatinine, Ser: 0.9 mg/dL (ref 0.61–1.24)
GLUCOSE: 92 mg/dL (ref 65–99)
HCT: 44 % (ref 39.0–52.0)
HEMOGLOBIN: 15 g/dL (ref 13.0–17.0)
Potassium: 3.9 mmol/L (ref 3.5–5.1)
SODIUM: 140 mmol/L (ref 135–145)
TCO2: 28 mmol/L (ref 0–100)

## 2016-02-20 MED ORDER — ONDANSETRON 8 MG PO TBDP
8.0000 mg | ORAL_TABLET | Freq: Once | ORAL | Status: AC
  Administered 2016-02-20: 8 mg via ORAL
  Filled 2016-02-20: qty 1

## 2016-02-20 MED ORDER — GI COCKTAIL ~~LOC~~
30.0000 mL | Freq: Once | ORAL | Status: AC
  Administered 2016-02-20: 30 mL via ORAL
  Filled 2016-02-20: qty 30

## 2016-02-20 MED ORDER — IPRATROPIUM-ALBUTEROL 0.5-2.5 (3) MG/3ML IN SOLN
3.0000 mL | Freq: Once | RESPIRATORY_TRACT | Status: AC
  Administered 2016-02-20: 3 mL via RESPIRATORY_TRACT
  Filled 2016-02-20: qty 3

## 2016-02-20 MED ORDER — ALBUTEROL SULFATE HFA 108 (90 BASE) MCG/ACT IN AERS
2.0000 | INHALATION_SPRAY | Freq: Once | RESPIRATORY_TRACT | Status: AC
  Administered 2016-02-20: 2 via RESPIRATORY_TRACT
  Filled 2016-02-20: qty 6.7

## 2016-02-20 NOTE — ED Notes (Signed)
Pt complains of an anxiety attack that woke him up, hx of the same,

## 2016-02-20 NOTE — Discharge Instructions (Signed)
Avoid alcohol or drugs. Take inhaler 2 puffs every 4 hrs for shortness of breath. Please follow up with family doctor if not improving or worsening.

## 2016-02-20 NOTE — ED Triage Notes (Signed)
Pt says that he hasn't taken any anxiety medication in two months, it made his blood pressure too high, he thinks it was Ativan

## 2016-02-20 NOTE — ED Provider Notes (Signed)
Thomas Vaughn [facility]-EMERGENCY DEPT Provider Note   CSN: [id number] Arrival date & time: 02/20/16  0700     History   Chief Complaint Chief Complaint  Patient presents with  . Anxiety    HPI Thomas Vaughn is a 26 y.o. male.  HPI  Thomas Vaughn is a 26 y.o. male presents to ED with complaint of chest pain, sob, palpitations. Pt reports onset of symptoms this morning. States feels like suddenly his heart begins to race and he cant breathe. States he becomes dizzy and his hands start to sweat. Patient reports similar symptoms in the past, was told he had anxiety. He states he has had several episodes now since this morning. At this time he denies any chest pain, shortness of breath, dizziness, nausea, diaphoresis. He states he has had 2 episodes while in the waiting room. He has not tried any medications for this. No PCP at this time. No other complaints.    Past Medical History:  Diagnosis Date  . ADHD (attention deficit hyperactivity disorder)   . Anxiety     Patient Active Problem List   Diagnosis Date Noted  . ADHD (attention deficit hyperactivity disorder)   . HYPOKALEMIA 07/29/2009  . ANEMIA OF OTHER CHRONIC DISEASE 07/29/2009  . GYNECOMASTIA 07/29/2009  . CHEST PAIN UNSPECIFIED 07/29/2009    History reviewed. No pertinent surgical history.     Home Medications    Prior to Admission medications   Medication Sig Start Date End Date Taking? Authorizing Provider  metroNIDAZOLE (FLAGYL) 500 MG tablet Take 1 tablet (500 mg total) by mouth 2 (two) times daily. X 7 days Patient not taking: Reported on 12/14/2015 05/27/15   Hayden Rasmussen, NP  omeprazole (PRILOSEC) 20 MG capsule Take 1 capsule (20 mg total) by mouth 2 (two) times daily. Patient not taking: Reported on 12/14/2015 05/16/15   Rolland Porter, MD  PRESCRIPTION MEDICATION Take 1-2 puffs by mouth every 6 (six) hours as needed (sob). Per patient, this was a friends medication. Albuterol inhaler.    Historical Provider, MD     Family History History reviewed. No pertinent family history.  Social History Social History  Substance Use Topics  . Smoking status: Never Smoker  . Smokeless tobacco: Never Used  . Alcohol use 4.2 oz/week    6 Cans of beer, 1 Shots of liquor per week     Comment: reports binge drinking - rarely      Allergies   Lorazepam   Review of Systems Review of Systems  Constitutional: Negative for chills and fever.  Respiratory: Positive for chest tightness and shortness of breath. Negative for cough.   Cardiovascular: Positive for chest pain and palpitations. Negative for leg swelling.  Gastrointestinal: Positive for nausea and vomiting. Negative for abdominal pain.  Musculoskeletal: Negative for arthralgias, myalgias, neck pain and neck stiffness.  Skin: Negative for rash.  Allergic/Immunologic: Negative for immunocompromised state.  Neurological: Negative for dizziness, weakness, light-headedness, numbness and headaches.     Physical Exam Updated Vital Signs BP 140/78 (BP Location: Right Arm)   Pulse 76   Temp 98.2 F (36.8 C) (Oral)   Resp 18   SpO2 96%   Physical Exam  Constitutional: He appears well-developed and well-nourished. No distress.  HENT:  Head: Normocephalic and atraumatic.  Eyes: Conjunctivae are normal.  Neck: Neck supple.  Cardiovascular: Normal rate, regular rhythm, normal heart sounds and intact distal pulses.   No murmur heard. Pulmonary/Chest: Effort normal. No respiratory distress. He has no wheezes. He has  no rales.  Abdominal: Soft. Bowel sounds are normal. He exhibits no distension. There is no tenderness. There is no rebound.  Musculoskeletal: He exhibits no edema.  Neurological: He is alert.  Skin: Skin is warm and dry.  Nursing note and vitals reviewed.    ED Treatments / Results  Labs (all labs ordered are listed, but only abnormal results are displayed) Labs Reviewed  I-STAT CHEM 8, ED  Rosezena SensorI-STAT TROPOININ, ED    EKG  EKG  Interpretation None       Radiology Dg Chest 2 View  Result Date: 02/20/2016 CLINICAL DATA:  Chest pain and shortness of breath. EXAM: CHEST  2 VIEW COMPARISON:  12/24/2015 and prior chest radiographs FINDINGS: The cardiomediastinal silhouette is unremarkable. There is no evidence of focal airspace disease, pulmonary edema, suspicious pulmonary nodule/mass, pleural effusion, or pneumothorax. No acute bony abnormalities are identified. IMPRESSION: No active cardiopulmonary disease. Electronically Signed   By: Harmon PierJeffrey  Hu M.D.   On: 02/20/2016 10:19    Procedures Procedures (including critical care time)  Medications Ordered in ED Medications  gi cocktail (Maalox,Lidocaine,Donnatal) (30 mLs Oral Given 02/20/16 1134)  ondansetron (ZOFRAN-ODT) disintegrating tablet 8 mg (8 mg Oral Given 02/20/16 1134)  ipratropium-albuterol (DUONEB) 0.5-2.5 (3) MG/3ML nebulizer solution 3 mL (3 mLs Nebulization Given 02/20/16 1216)  albuterol (PROVENTIL HFA;VENTOLIN HFA) 108 (90 Base) MCG/ACT inhaler 2 puff (2 puffs Inhalation Given 02/20/16 1239)     Initial Impression / Assessment and Plan / ED Course  I have reviewed the triage vital signs and the nursing notes.  Pertinent labs & imaging results that were available during my care of the patient were reviewed by me and considered in my medical decision making (see chart for details).     Patient with shortness of breath, palpitations, intermittent since early this morning. He exam unremarkable. Will obtain chest x-ray and EKG. Will get basic labs.   Discussed with Dr. Ranae PalmsYelverton who has seen patient. Question anxiety versus asthma. Patient also does have a PVC on EKG, wondering if that could be causing him to have palpitations. Given a breathing treatment in emergency department which has helped. We'll treated with an inhaler. Follow-up with primary care doctor. Chest x-ray labs are normal. Patient is in no acute distress. He has a monitored emergency  department for several hours now with no dysrhythmias. He stable for outpatient follow-up. Return precautions discussed.  Vitals:   02/20/16 0704 02/20/16 1040 02/20/16 1136  BP: 140/78  135/74  Pulse: 76  83  Resp: 18 17 13   Temp: 98.2 F (36.8 C)    TempSrc: Oral    SpO2: 96%  97%     Final Clinical Impressions(s) / ED Diagnoses   Final diagnoses:  Palpitations  Shortness of breath    New Prescriptions Discharge Medication List as of 02/20/2016 12:23 PM       Jaynie Crumbleatyana Kohl Polinsky, PA-C 02/20/16 1519    Loren Raceravid Yelverton, MD 02/25/16 1534

## 2016-02-20 NOTE — ED Notes (Signed)
Pt states that he felt like he couldn't breathe this am and his chest was hurting a little

## 2016-04-10 ENCOUNTER — Emergency Department (HOSPITAL_COMMUNITY)
Admission: EM | Admit: 2016-04-10 | Discharge: 2016-04-10 | Disposition: A | Discharge status: Home / Self Care | Payer: Self-pay | Attending: Emergency Medicine | Admitting: Emergency Medicine

## 2016-04-10 ENCOUNTER — Encounter (HOSPITAL_COMMUNITY): Payer: Self-pay

## 2016-04-10 DIAGNOSIS — F909 Attention-deficit hyperactivity disorder, unspecified type: Secondary | ICD-10-CM | POA: Insufficient documentation

## 2016-04-10 DIAGNOSIS — E860 Dehydration: Secondary | ICD-10-CM | POA: Insufficient documentation

## 2016-04-10 DIAGNOSIS — R112 Nausea with vomiting, unspecified: Secondary | ICD-10-CM | POA: Insufficient documentation

## 2016-04-10 DIAGNOSIS — Z79899 Other long term (current) drug therapy: Secondary | ICD-10-CM | POA: Insufficient documentation

## 2016-04-10 MED ORDER — PROMETHAZINE HCL 25 MG PO TABS: 25.0000 mg | ORAL_TABLET | Freq: Four times a day (QID) | ORAL | 0 refills | Status: DC | PRN

## 2016-04-10 MED ORDER — ONDANSETRON HCL 4 MG/2ML IJ SOLN
4.0000 mg | Freq: Once | INTRAMUSCULAR | Status: AC
  Administered 2016-04-10: 4 mg via INTRAVENOUS
  Filled 2016-04-10: qty 2

## 2016-04-10 MED ORDER — KETOROLAC TROMETHAMINE 30 MG/ML IJ SOLN
30.0000 mg | Freq: Once | INTRAMUSCULAR | Status: AC
  Administered 2016-04-10: 30 mg via INTRAVENOUS
  Filled 2016-04-10: qty 1

## 2016-04-10 MED ORDER — SODIUM CHLORIDE 0.9 % IV BOLUS (SEPSIS)
1000.0000 mL | Freq: Once | INTRAVENOUS | Status: AC
  Administered 2016-04-10: 1000 mL via INTRAVENOUS

## 2016-04-10 NOTE — ED Triage Notes (Signed)
Patient complains of drinking too much last night and has had abdominal cramping and vomiting since early am. Alert and oriented, NAD

## 2016-04-10 NOTE — ED Provider Notes (Signed)
[facility]-EMERGENCY DEPT Provider Note   CSN: [id number] Arrival date & time: 04/10/16  1234     History   Chief Complaint Chief Complaint  Patient presents with  . Emesis    HPI Thomas Vaughn is a 26 y.o. male.  HPI Patient reports abdominal cramping and nausea vomiting since early this morning.  He states that he did drink a lot of alcohol last night.  He denies focused abdominal pain.  No fevers or chills.  No diarrhea.  No recent sick contacts.  Symptoms are moderate in severity   Past Medical History:  Diagnosis Date  . ADHD (attention deficit hyperactivity disorder)   . Anxiety     Patient Active Problem List   Diagnosis Date Noted  . ADHD (attention deficit hyperactivity disorder)   . HYPOKALEMIA 07/29/2009  . ANEMIA OF OTHER CHRONIC DISEASE 07/29/2009  . GYNECOMASTIA 07/29/2009  . CHEST PAIN UNSPECIFIED 07/29/2009    History reviewed. No pertinent surgical history.     Home Medications    Prior to Admission medications   Medication Sig Start Date End Date Taking? Authorizing Provider  omeprazole (PRILOSEC) 20 MG capsule Take 1 capsule (20 mg total) by mouth 2 (two) times daily. Patient not taking: Reported on 12/14/2015 05/16/15   Rolland Porter, MD  PRESCRIPTION MEDICATION Take 1-2 puffs by mouth every 6 (six) hours as needed (sob). Per patient, this was a friends medication. Albuterol inhaler.    Historical Provider, MD    Family History No family history on file.  Social History Social History  Substance Use Topics  . Smoking status: Never Smoker  . Smokeless tobacco: Never Used  . Alcohol use 4.2 oz/week    6 Cans of beer, 1 Shots of liquor per week     Comment: reports binge drinking - rarely      Allergies   Lorazepam   Review of Systems Review of Systems  All other systems reviewed and are negative.    Physical Exam Updated Vital Signs BP 118/72 (BP Location: Left Arm)   Pulse 86   Temp 97.7 F (36.5 C) (Oral)   Resp 16   Ht 5'  7" (1.702 m)   Wt 150 lb (68 kg)   SpO2 100%   BMI 23.49 kg/m   Physical Exam  Constitutional: He is oriented to person, place, and time. He appears well-developed and well-nourished.  HENT:  Head: Normocephalic and atraumatic.  Eyes: EOM are normal.  Neck: Normal range of motion.  Cardiovascular: Normal rate, regular rhythm, normal heart sounds and intact distal pulses.   Pulmonary/Chest: Effort normal and breath sounds normal. No respiratory distress.  Abdominal: Soft. He exhibits no distension. There is no tenderness.  Musculoskeletal: Normal range of motion.  Neurological: He is alert and oriented to person, place, and time.  Skin: Skin is warm and dry.  Psychiatric: He has a normal mood and affect. Judgment normal.  Nursing note and vitals reviewed.    ED Treatments / Results  Labs (all labs ordered are listed, but only abnormal results are displayed) Labs Reviewed - No data to display  EKG  EKG Interpretation None       Radiology No results found.  Procedures Procedures (including critical care time)  Medications Ordered in ED Medications  ondansetron (ZOFRAN) injection 4 mg (not administered)  ketorolac (TORADOL) 30 MG/ML injection 30 mg (not administered)  sodium chloride 0.9 % bolus 1,000 mL (not administered)     Initial Impression / Assessment and Plan /  ED Course  I have reviewed the triage vital signs and the nursing notes.  Pertinent labs & imaging results that were available during my care of the patient were reviewed by me and considered in my medical decision making (see chart for details).     Patient be treated symptomatically with nausea medicine and IV fluids.  After which he can be discharged home safely with nausea medications.  Final Clinical Impressions(s) / ED Diagnoses   Final diagnoses:  None    New Prescriptions New Prescriptions   No medications on file     Azalia Bilis, MD 04/10/16 1337

## 2016-04-10 NOTE — ED Notes (Signed)
Declined W/C at D/C and was escorted to lobby by RN. 

## 2016-05-01 ENCOUNTER — Emergency Department (HOSPITAL_COMMUNITY)
Admission: EM | Admit: 2016-05-01 | Discharge: 2016-05-01 | Disposition: A | Discharge status: Home / Self Care | Payer: Self-pay | Attending: Emergency Medicine | Admitting: Emergency Medicine

## 2016-05-01 ENCOUNTER — Emergency Department (HOSPITAL_COMMUNITY): Payer: Self-pay

## 2016-05-01 ENCOUNTER — Encounter (HOSPITAL_COMMUNITY): Payer: Self-pay

## 2016-05-01 DIAGNOSIS — R0789 Other chest pain: Secondary | ICD-10-CM | POA: Insufficient documentation

## 2016-05-01 DIAGNOSIS — Z79899 Other long term (current) drug therapy: Secondary | ICD-10-CM | POA: Insufficient documentation

## 2016-05-01 DIAGNOSIS — Z789 Other specified health status: Secondary | ICD-10-CM

## 2016-05-01 DIAGNOSIS — F1010 Alcohol abuse, uncomplicated: Secondary | ICD-10-CM | POA: Insufficient documentation

## 2016-05-01 DIAGNOSIS — F909 Attention-deficit hyperactivity disorder, unspecified type: Secondary | ICD-10-CM | POA: Insufficient documentation

## 2016-05-01 DIAGNOSIS — R002 Palpitations: Secondary | ICD-10-CM | POA: Insufficient documentation

## 2016-05-01 LAB — CBC
HEMATOCRIT: 42.5 % (ref 39.0–52.0)
HEMOGLOBIN: 14.4 g/dL (ref 13.0–17.0)
MCH: 29.3 pg (ref 26.0–34.0)
MCHC: 33.9 g/dL (ref 30.0–36.0)
MCV: 86.4 fL (ref 78.0–100.0)
PLATELETS: 252 10*3/uL (ref 150–400)
RBC: 4.92 MIL/uL (ref 4.22–5.81)
RDW: 13 % (ref 11.5–15.5)
WBC: 5.5 10*3/uL (ref 4.0–10.5)

## 2016-05-01 LAB — BASIC METABOLIC PANEL
ANION GAP: 13 (ref 5–15)
BUN: 9 mg/dL (ref 6–20)
CO2: 25 mmol/L (ref 22–32)
CREATININE: 0.92 mg/dL (ref 0.61–1.24)
Calcium: 9.6 mg/dL (ref 8.9–10.3)
Chloride: 98 mmol/L — ABNORMAL LOW (ref 101–111)
GFR calc non Af Amer: >60 mL/min (ref >60)
Glucose, Bld: 72 mg/dL (ref 65–99)
POTASSIUM: 4.1 mmol/L (ref 3.5–5.1)
SODIUM: 136 mmol/L (ref 135–145)

## 2016-05-01 LAB — I-STAT TROPONIN, ED: Troponin i, poc: 0 ng/mL (ref 0.00–0.08)

## 2016-05-01 LAB — RAPID URINE DRUG SCREEN, HOSP PERFORMED
Amphetamines: NOT DETECTED
Barbiturates: NOT DETECTED
Benzodiazepines: NOT DETECTED
COCAINE: NOT DETECTED
OPIATES: NOT DETECTED
TETRAHYDROCANNABINOL: NOT DETECTED

## 2016-05-01 MED ORDER — ASPIRIN 81 MG PO CHEW: 324.0000 mg | CHEWABLE_TABLET | Freq: Once | ORAL | Status: DC

## 2016-05-01 MED ORDER — GI COCKTAIL ~~LOC~~
30.0000 mL | Freq: Once | ORAL | Status: AC
  Administered 2016-05-01: 30 mL via ORAL
  Filled 2016-05-01: qty 30

## 2016-05-01 NOTE — ED Provider Notes (Signed)
[facility]PT Provider Note   CSN: [id number] Arrival date & time: 05/01/16  0910     History   Chief Complaint Chief Complaint  Patient presents with  . Chest Pain    HPI Thomas Vaughn is a 26 y.o. male who  has a past medical history of ADHD (attention deficit hyperactivity disorder) and Anxiety. The patient complains of central chest substernal chest pain that does not radiate as well as shortness of breath. He states that it started this morning. He reports that the pain comes and goes and the intensity stays the same. He describes the pain as squeezing. No aggrevating or alleviating factors. Aspirin and NTG were given with no relief of symptoms. He rates the pain as an 8/10.The patient has a family history of death due to cardiac causes in both grandparents before the age 38.  He denies smoking or illicit drug use. He admits to associated headache, palpitations, dizziness, weakness and cold sweats. He denies cough, numbness and tingling.  The patient is also   HPI  Past Medical History:  Diagnosis Date  . ADHD (attention deficit hyperactivity disorder)   . Anxiety     Patient Active Problem List   Diagnosis Date Noted  . ADHD (attention deficit hyperactivity disorder)   . HYPOKALEMIA 07/29/2009  . ANEMIA OF OTHER CHRONIC DISEASE 07/29/2009  . GYNECOMASTIA 07/29/2009  . CHEST PAIN UNSPECIFIED 07/29/2009    History reviewed. No pertinent surgical history.     Home Medications    Prior to Admission medications   Medication Sig Start Date End Date Taking? Authorizing Provider  albuterol (PROVENTIL HFA;VENTOLIN HFA) 108 (90 Base) MCG/ACT inhaler Inhale 1-2 puffs into the lungs every 4 (four) hours as needed for wheezing or shortness of breath.   Yes Historical Provider, MD    Family History No family history on file.  Social History Social History  Substance Use Topics  . Smoking status: Never Smoker  . Smokeless tobacco: Never Used  . Alcohol use  4.2 oz/week    6 Cans of beer, 1 Shots of liquor per week     Comment: reports binge drinking - rarely      Allergies   Lorazepam   Review of Systems Review of Systems  Ten systems reviewed and are negative for acute change, except as noted in the HPI.   Physical Exam Updated Vital Signs BP 123/71 (BP Location: Right Arm)   Pulse 72   Temp 98.2 F (36.8 C) (Oral)   Resp 14   SpO2 99%   Physical Exam  Physical Exam  Nursing note and vitals reviewed. Constitutional: He appears well-developed and well-nourished. No distress.  HENT:  Head: Normocephalic and atraumatic.  Eyes: Conjunctivae normal are normal. No scleral icterus.  Neck: Normal range of motion. Neck supple.  Cardiovascular: Normal rate, regular rhythm and normal heart sounds.   Pulmonary/Chest: Effort normal and breath sounds normal. No respiratory distress.  Abdominal: Soft. There is no tenderness.  Musculoskeletal: He exhibits no edema.  Neurological: He is alert.  Skin: Skin is warm and dry. He is not diaphoretic.  Psychiatric: His behavior is normal.    ED Treatments / Results  Labs (all labs ordered are listed, but only abnormal results are displayed) Labs Reviewed - No data to display  EKG  EKG Interpretation  Date/Time:  Saturday May 01 2016 09:15:18 EDT Ventricular Rate:  74 PR Interval:    QRS Duration: 95 QT Interval:  381 QTC Calculation: 423 R Axis:  83 Text Interpretation:  Sinus rhythm ST elev, probable normal early repol pattern When compared to prior, no significant changes seen.  No STEMI Confirmed by Rush Landmark MD, CHRISTOPHER [phone]) on 05/01/2016 9:57:59 AM       Radiology No results found.  Procedures Procedures (including critical care time)  Medications Ordered in ED Medications - No data to display   Initial Impression / Assessment and Plan / ED Course  I have reviewed the triage vital signs and the nursing notes.  Pertinent labs & imaging results that were  available during my care of the patient were reviewed by me and considered in my medical decision making (see chart for details).      The patient and I had a long conversation about his alcohol abuse. I did speak with him about its cardiotoxicity and the likelihood that this may be causing the symptoms of palpitations. He feels. Patient may also be having tachycardia arrhythmias and has been seen previously and worked up by cardiology. I have given the patient outpatient resources for Alcoholics Anonymous meetings as well as outpatient counseling and advised the patient that it would do him very good to discontinue his heavy use of alcohol. He denies other drug use. He appears safe for discharge at this time.  Final Clinical Impressions(s) / ED Diagnoses   Final diagnoses:  Atypical chest pain  Palpitations  Alcohol consumption of more than four drinks per day    New Prescriptions New Prescriptions   No medications on file     Arthor Captain, PA-C 05/02/16 1641    Canary Brim Tegeler, MD 05/02/16 2002

## 2016-05-01 NOTE — ED Notes (Signed)
Pt request GI cocktail before DC. Abby PA states to give same.

## 2016-05-01 NOTE — ED Notes (Addendum)
Pt states he understands instructions and request buss pass. Same given and pt home stable with steady gait

## 2016-05-01 NOTE — Discharge Instructions (Signed)

## 2016-05-01 NOTE — ED Notes (Signed)
Patient transported to X-ray 

## 2016-05-01 NOTE — ED Triage Notes (Addendum)
Pt presents for evaluation of central cp with no radiation. Pt states pain woke him up from sleep. Pt reports nausea/vomiting and sob. Pt reports hx of same, states hx of anxiety. Pt received 324 ASA and 1 Nitro in route with no improvement.

## 2016-05-16 ENCOUNTER — Emergency Department (HOSPITAL_COMMUNITY): Payer: Self-pay

## 2016-05-16 ENCOUNTER — Encounter (HOSPITAL_COMMUNITY): Payer: Self-pay

## 2016-05-16 ENCOUNTER — Emergency Department (HOSPITAL_COMMUNITY)
Admission: EM | Admit: 2016-05-16 | Discharge: 2016-05-16 | Disposition: A | Discharge status: Home / Self Care | Payer: Self-pay | Attending: Emergency Medicine | Admitting: Emergency Medicine

## 2016-05-16 DIAGNOSIS — F410 Panic disorder [episodic paroxysmal anxiety] without agoraphobia: Secondary | ICD-10-CM

## 2016-05-16 DIAGNOSIS — F909 Attention-deficit hyperactivity disorder, unspecified type: Secondary | ICD-10-CM | POA: Insufficient documentation

## 2016-05-16 DIAGNOSIS — R0600 Dyspnea, unspecified: Secondary | ICD-10-CM

## 2016-05-16 DIAGNOSIS — F419 Anxiety disorder, unspecified: Secondary | ICD-10-CM | POA: Insufficient documentation

## 2016-05-16 MED ORDER — ALPRAZOLAM 0.25 MG PO TABS
0.5000 mg | ORAL_TABLET | Freq: Once | ORAL | Status: AC
Start: 2016-05-16 — End: 2016-05-16
  Administered 2016-05-16: 0.5 mg via ORAL
  Filled 2016-05-16: qty 2

## 2016-05-16 NOTE — Discharge Instructions (Signed)
Please see [facility] for [facility] diagnosis and care.  Please return to the ER if you have worsening chest pain, shortness of breath, pain radiating to your jaw, shoulder, or back, sweats or fainting.

## 2016-05-16 NOTE — ED Triage Notes (Signed)
[facility] with complaint of shortness of breath. States that he awoke and felt as If he couldn't catch his breath. EMS reports lungs clear, sats 100%. Patient has hx of anxiety and it often presents in this fashion per patient. Denies pain

## 2016-05-16 NOTE — ED Provider Notes (Signed)
[facility] Provider Note   CSN: [id number] Arrival date & time: 05/16/16  [id number]     History   Chief Complaint No chief complaint on file.   HPI Thomas Vaughn is a 26 y.o. male.  HPI Pt comes in with cc of anxiety and dib. Pt reports that he woke up this morning, and noted that he was feeling like he wasn't getting enough air in. He was breathing fast and shallow. Pt denies any cough, wheezing, chest pain. Pt has hx of similar symptoms in the past and was told he had anxiety. Pt is not taking any meds for anxiety. Pt has no hx of PE, DVT and denies any exogenous hormone (testosterone / estrogen) use, long distance travels or surgery in the past 6 weeks, active cancer, recent immobilization. Pt also denies any substance abuse.  Past Medical History:  Diagnosis Date  . ADHD (attention deficit hyperactivity disorder)   . Anxiety     Patient Active Problem List   Diagnosis Date Noted  . ADHD (attention deficit hyperactivity disorder)   . HYPOKALEMIA 07/29/2009  . ANEMIA OF OTHER CHRONIC DISEASE 07/29/2009  . GYNECOMASTIA 07/29/2009  . CHEST PAIN UNSPECIFIED 07/29/2009    History reviewed. No pertinent surgical history.     Home Medications    Prior to Admission medications   Medication Sig Start Date End Date Taking? Authorizing Provider  albuterol (PROVENTIL HFA;VENTOLIN HFA) 108 (90 Base) MCG/ACT inhaler Inhale 1-2 puffs into the lungs every 4 (four) hours as needed for wheezing or shortness of breath.   Yes [provider]    Family History No family history on file.  Social History Social History  Substance Use Topics  . Smoking status: Never Smoker  . Smokeless tobacco: Never Used  . Alcohol use 4.2 oz/week    6 Cans of beer, 1 Shots of liquor per week     Comment: reports binge drinking - rarely      Allergies   Lorazepam   Review of Systems Review of Systems  Constitutional: Positive for activity change.  Respiratory: Positive  for shortness of breath.   Cardiovascular: Negative for chest pain.  Allergic/Immunologic: Negative for immunocompromised state.        Physical Exam Updated Vital Signs BP 127/88   Pulse 64   Temp 98.6 F (37 C) (Oral)   Resp 16   Ht 5\' 6"  (1.676 m)   Wt 160 lb (72.6 kg)   SpO2 95%   BMI 25.82 kg/m   Physical Exam  Constitutional: He appears well-developed and well-nourished.  HENT:  Head: Normocephalic and atraumatic.  Eyes: Conjunctivae are normal.  Neck: Neck supple. No JVD present.  Cardiovascular: Normal rate and regular rhythm.  Exam reveals no gallop and no friction rub.   No murmur heard. Pulmonary/Chest: Effort normal and breath sounds normal. No respiratory distress. He has no wheezes.  Abdominal: Soft. There is no tenderness.  Musculoskeletal: He exhibits no edema or tenderness.  Neurological: He is alert.  Skin: Skin is warm and dry.  Psychiatric: He has a normal mood and affect.  Nursing note and vitals reviewed.    ED Treatments / Results  Labs (all labs ordered are listed, but only abnormal results are displayed) Labs Reviewed - No data to display  EKG  EKG Interpretation  Date/Time:  Sunday May 16 2016 09:43:59 EDT Ventricular Rate:  69 PR Interval:    QRS Duration: 98 QT Interval:  376 QTC Calculation: 403 R Axis:  83 Text Interpretation:  Sinus arrhythmia ST elevation diffusely No acute changes Confirmed by Derwood Kaplan [phone]) on 05/16/2016 10:34:27 AM       Radiology Dg Chest 2 View  Result Date: 05/16/2016 CLINICAL DATA:  Shortness of breath, chest pain, vomiting and congestion. EXAM: CHEST  2 VIEW COMPARISON:  05/01/2016 FINDINGS: The heart size and mediastinal contours are within normal limits. There is no evidence of pulmonary edema, consolidation, pneumothorax, nodule or pleural fluid. The visualized skeletal structures are unremarkable. IMPRESSION: No active cardiopulmonary disease. Electronically Signed   By: Irish Lack  M.D.   On: 05/16/2016 10:32    Procedures Procedures (including critical care time)  Medications Ordered in ED Medications  ALPRAZolam Prudy Feeler) tablet 0.5 mg (0.5 mg Oral Given 05/16/16 1142)     Initial Impression / Assessment and Plan / ED Course  I have reviewed the triage vital signs and the nursing notes.  Pertinent labs & imaging results that were available during my care of the patient were reviewed by me and considered in my medical decision making (see chart for details).  Clinical Course as of May 17 1214  Wynelle Link May 16, 2016  1137 Upon reassessment, pt is comfortable. No hypoxia, dyspnea, chest pain. The patient is reassured that these symptoms do not appear to represent a serious or threatening condition.   [AN]  1137 EKG 12-Lead [AN]  1137 Results from the ER workup discussed with the patient face to face and all questions answered to the best of my ability.  DG Chest 2 View [AN]    Clinical Course User Index [AN] Derwood Kaplan, MD    Pt comes in with cc of dib. Pt is noted to have no tachypnea, resp distress, chest pain. There is no hypoxia. Lungs are clear. Pt doesn't use any drugs. EKG is neg, CXR is clear.  We will give a dose of xanax. Pt will be advised to see [facility]. ED evaluation on tele led to no concerning dysrhythmia.   Final Clinical Impressions(s) / ED Diagnoses   Final diagnoses:  Anxiety attack  Dyspnea, unspecified type    New Prescriptions Discharge Medication List as of 05/16/2016 11:37 AM       Derwood Kaplan, MD 05/16/16 1221

## 2016-05-16 NOTE — ED Notes (Signed)
Pt back from X-ray.  

## 2016-06-02 ENCOUNTER — Encounter (HOSPITAL_COMMUNITY): Payer: Self-pay

## 2016-06-02 ENCOUNTER — Emergency Department (HOSPITAL_COMMUNITY): Payer: Self-pay

## 2016-06-02 ENCOUNTER — Emergency Department (HOSPITAL_COMMUNITY)
Admission: EM | Admit: 2016-06-02 | Discharge: 2016-06-02 | Disposition: A | Discharge status: Home / Self Care | Payer: Self-pay | Attending: Emergency Medicine | Admitting: Emergency Medicine

## 2016-06-02 DIAGNOSIS — F909 Attention-deficit hyperactivity disorder, unspecified type: Secondary | ICD-10-CM | POA: Insufficient documentation

## 2016-06-02 DIAGNOSIS — R0789 Other chest pain: Secondary | ICD-10-CM | POA: Insufficient documentation

## 2016-06-02 LAB — CBC
HEMATOCRIT: 40.4 % (ref 39.0–52.0)
HEMOGLOBIN: 13.3 g/dL (ref 13.0–17.0)
MCH: 28.9 pg (ref 26.0–34.0)
MCHC: 32.9 g/dL (ref 30.0–36.0)
MCV: 87.6 fL (ref 78.0–100.0)
Platelets: 226 10*3/uL (ref 150–400)
RBC: 4.61 MIL/uL (ref 4.22–5.81)
RDW: 13.2 % (ref 11.5–15.5)
WBC: 4.2 10*3/uL (ref 4.0–10.5)

## 2016-06-02 LAB — BASIC METABOLIC PANEL
ANION GAP: 14 (ref 5–15)
BUN: 8 mg/dL (ref 6–20)
CHLORIDE: 99 mmol/L — AB (ref 101–111)
CO2: 24 mmol/L (ref 22–32)
Calcium: 9.3 mg/dL (ref 8.9–10.3)
Creatinine, Ser: 0.81 mg/dL (ref 0.61–1.24)
Glucose, Bld: 78 mg/dL (ref 65–99)
POTASSIUM: 3.5 mmol/L (ref 3.5–5.1)
SODIUM: 137 mmol/L (ref 135–145)

## 2016-06-02 LAB — I-STAT TROPONIN, ED: Troponin i, poc: 0 ng/mL (ref 0.00–0.08)

## 2016-06-02 MED ORDER — IBUPROFEN 600 MG PO TABS: 600.0000 mg | ORAL_TABLET | Freq: Four times a day (QID) | ORAL | 0 refills | Status: DC | PRN

## 2016-06-02 MED ORDER — IBUPROFEN 800 MG PO TABS
800.0000 mg | ORAL_TABLET | Freq: Once | ORAL | Status: AC
  Administered 2016-06-02: 800 mg via ORAL
  Filled 2016-06-02: qty 1

## 2016-06-02 MED ORDER — METHOCARBAMOL 750 MG PO TABS: 750.0000 mg | ORAL_TABLET | Freq: Four times a day (QID) | ORAL | 0 refills | Status: DC

## 2016-06-02 MED ORDER — METHOCARBAMOL 500 MG PO TABS
500.0000 mg | ORAL_TABLET | Freq: Once | ORAL | Status: AC
  Administered 2016-06-02: 500 mg via ORAL
  Filled 2016-06-02: qty 1

## 2016-06-02 NOTE — ED Triage Notes (Signed)
Pt here for chest pain, onset yesterday, reports some dizziness also.

## 2016-06-02 NOTE — ED Provider Notes (Signed)
[facility] Provider Note   CSN: [id number] Arrival date & time: 06/02/16  [id number]     History   Chief Complaint Chief Complaint  Patient presents with  . Chest Pain    HPI Thomas Vaughn is a 26 y.o. male.  26 year old male presents with one-day history of persistent sharp left lower parasternal chest pain. No associated fever, dyspnea, anginal symptoms. Pain is worse with movement better with remaining still. No cough or congestion. No prior history of same. Denies any rashes to the area. No syncope or near-syncope. No recent medication use.      Past Medical History:  Diagnosis Date  . ADHD (attention deficit hyperactivity disorder)   . Anxiety     Patient Active Problem List   Diagnosis Date Noted  . ADHD (attention deficit hyperactivity disorder)   . HYPOKALEMIA 07/29/2009  . ANEMIA OF OTHER CHRONIC DISEASE 07/29/2009  . GYNECOMASTIA 07/29/2009  . CHEST PAIN UNSPECIFIED 07/29/2009    History reviewed. No pertinent surgical history.     Home Medications    Prior to Admission medications   Medication Sig Start Date End Date Taking? Authorizing Provider  albuterol (PROVENTIL HFA;VENTOLIN HFA) 108 (90 Base) MCG/ACT inhaler Inhale 1-2 puffs into the lungs every 4 (four) hours as needed for wheezing or shortness of breath.    [provider]    Family History History reviewed. No pertinent family history.  Social History Social History  Substance Use Topics  . Smoking status: Never Smoker  . Smokeless tobacco: Never Used  . Alcohol use 4.2 oz/week    6 Cans of beer, 1 Shots of liquor per week     Comment: reports binge drinking - rarely      Allergies   Lorazepam   Review of Systems Review of Systems  All other systems reviewed and are negative.    Physical Exam Updated Vital Signs BP 131/78 (BP Location: Right Arm)   Pulse 75   Temp 97.8 F (36.6 C) (Oral)   Resp 16   Ht 1.676 m (5\' 6" )   Wt 70.3 kg (155 lb)   SpO2  97%   BMI 25.02 kg/m   Physical Exam  Constitutional: He is oriented to person, place, and time. He appears well-developed and well-nourished.  Non-toxic appearance. No distress.  HENT:  Head: Normocephalic and atraumatic.  Eyes: Conjunctivae, EOM and lids are normal. Pupils are equal, round, and reactive to light.  Neck: Normal range of motion. Neck supple. No tracheal deviation present. No thyroid mass present.  Cardiovascular: Normal rate, regular rhythm and normal heart sounds.  Exam reveals no gallop.   No murmur heard. Pulmonary/Chest: Effort normal and breath sounds normal. No stridor. No respiratory distress. He has no decreased breath sounds. He has no wheezes. He has no rhonchi. He has no rales.    Abdominal: Soft. Normal appearance and bowel sounds are normal. He exhibits no distension. There is no tenderness. There is no rebound and no CVA tenderness.  Musculoskeletal: Normal range of motion. He exhibits no edema or tenderness.  Neurological: He is alert and oriented to person, place, and time. He has normal strength. No cranial nerve deficit or sensory deficit. GCS eye subscore is 4. GCS verbal subscore is 5. GCS motor subscore is 6.  Skin: Skin is warm and dry. No abrasion and no rash noted.  Psychiatric: He has a normal mood and affect. His speech is normal and behavior is normal.  Nursing note and vitals reviewed.  ED Treatments / Results  Labs (all labs ordered are listed, but only abnormal results are displayed) Labs Reviewed  BASIC METABOLIC PANEL - Abnormal; Notable for the following:       Result Value   Chloride 99 (*)    All other components within normal limits  CBC  I-STAT TROPOININ, ED    EKG  EKG Interpretation  Date/Time:  Wednesday Jun 02 2016 06:47:49 EDT Ventricular Rate:  72 PR Interval:  170 QRS Duration: 96 QT Interval:  380 QTC Calculation: 416 R Axis:   66 Text Interpretation:  Sinus rhythm with marked sinus arrhythmia Nonspecific ST  abnormality Abnormal ECG No significant change since last tracing Confirmed by Freida Busman  MD, Olanda Downie ([id number]) on 06/02/2016 7:29:54 AM       Radiology Dg Chest 2 View  Result Date: 06/02/2016 CLINICAL DATA:  Left chest pain beginning yesterday EXAM: CHEST  2 VIEW COMPARISON:  05/16/2016 FINDINGS: Normal heart size and mediastinal contours. No acute infiltrate or edema. No effusion or pneumothorax. No acute osseous findings. IMPRESSION: Negative chest. Electronically Signed   By: Marnee Spring M.D.   On: 06/02/2016 07:26    Procedures Procedures (including critical care time)  Medications Ordered in ED Medications  ibuprofen (ADVIL,MOTRIN) tablet 800 mg (not administered)  methocarbamol (ROBAXIN) tablet 500 mg (not administered)     Initial Impression / Assessment and Plan / ED Course  I have reviewed the triage vital signs and the nursing notes.  Pertinent labs & imaging results that were available during my care of the patient were reviewed by me and considered in my medical decision making (see chart for details).     Patient with chest wall pain treated with medications here and does feel better. Will place on an inflammatory some muscle relaxants and discharged home.  Final Clinical Impressions(s) / ED Diagnoses   Final diagnoses:  None    New Prescriptions New Prescriptions   No medications on file     Lorre Nick, MD 06/02/16 [phone]

## 2016-06-02 NOTE — ED Notes (Signed)
Pt did not need anything at this time  

## 2016-06-17 ENCOUNTER — Emergency Department (HOSPITAL_COMMUNITY): Payer: Self-pay

## 2016-06-17 ENCOUNTER — Encounter (HOSPITAL_COMMUNITY): Payer: Self-pay | Admitting: Emergency Medicine

## 2016-06-17 ENCOUNTER — Emergency Department (HOSPITAL_COMMUNITY)
Admission: EM | Admit: 2016-06-17 | Discharge: 2016-06-17 | Disposition: A | Discharge status: Home / Self Care | Payer: Self-pay | Attending: Emergency Medicine | Admitting: Emergency Medicine

## 2016-06-17 DIAGNOSIS — K2900 Acute gastritis without bleeding: Secondary | ICD-10-CM | POA: Insufficient documentation

## 2016-06-17 DIAGNOSIS — F10280 Alcohol dependence with alcohol-induced anxiety disorder: Secondary | ICD-10-CM

## 2016-06-17 DIAGNOSIS — F1024 Alcohol dependence with alcohol-induced mood disorder: Secondary | ICD-10-CM | POA: Insufficient documentation

## 2016-06-17 DIAGNOSIS — R1030 Lower abdominal pain, unspecified: Secondary | ICD-10-CM | POA: Insufficient documentation

## 2016-06-17 DIAGNOSIS — F419 Anxiety disorder, unspecified: Secondary | ICD-10-CM

## 2016-06-17 DIAGNOSIS — K219 Gastro-esophageal reflux disease without esophagitis: Secondary | ICD-10-CM | POA: Insufficient documentation

## 2016-06-17 LAB — URINALYSIS, ROUTINE W REFLEX MICROSCOPIC
BILIRUBIN URINE: NEGATIVE
Bacteria, UA: NONE SEEN
GLUCOSE, UA: NEGATIVE mg/dL
Hgb urine dipstick: NEGATIVE
KETONES UR: 20 mg/dL — AB
LEUKOCYTES UA: NEGATIVE
Nitrite: NEGATIVE
PH: 6 (ref 5.0–8.0)
Protein, ur: 100 mg/dL — AB
Specific Gravity, Urine: 1.029 (ref 1.005–1.030)

## 2016-06-17 LAB — BASIC METABOLIC PANEL
Anion gap: 14 (ref 5–15)
BUN: 10 mg/dL (ref 6–20)
CHLORIDE: 99 mmol/L — AB (ref 101–111)
CO2: 25 mmol/L (ref 22–32)
Calcium: 9.9 mg/dL (ref 8.9–10.3)
Creatinine, Ser: 0.91 mg/dL (ref 0.61–1.24)
GFR calc Af Amer: >60 mL/min (ref >60)
GFR calc non Af Amer: >60 mL/min (ref >60)
Glucose, Bld: 83 mg/dL (ref 65–99)
POTASSIUM: 3.8 mmol/L (ref 3.5–5.1)
SODIUM: 138 mmol/L (ref 135–145)

## 2016-06-17 LAB — CBC
HEMATOCRIT: 40.9 % (ref 39.0–52.0)
Hemoglobin: 13.8 g/dL (ref 13.0–17.0)
MCH: 29.8 pg (ref 26.0–34.0)
MCHC: 33.7 g/dL (ref 30.0–36.0)
MCV: 88.3 fL (ref 78.0–100.0)
Platelets: 251 10*3/uL (ref 150–400)
RBC: 4.63 MIL/uL (ref 4.22–5.81)
RDW: 13 % (ref 11.5–15.5)
WBC: 4.2 10*3/uL (ref 4.0–10.5)

## 2016-06-17 LAB — I-STAT TROPONIN, ED: Troponin i, poc: 0 ng/mL (ref 0.00–0.08)

## 2016-06-17 LAB — LIPASE, BLOOD: LIPASE: 28 U/L (ref 11–51)

## 2016-06-17 MED ORDER — PANTOPRAZOLE SODIUM 40 MG PO TBEC: 40.0000 mg | DELAYED_RELEASE_TABLET | Freq: Every day | ORAL | 0 refills | Status: DC

## 2016-06-17 MED ORDER — GI COCKTAIL ~~LOC~~
30.0000 mL | Freq: Once | ORAL | Status: AC
  Administered 2016-06-17: 30 mL via ORAL
  Filled 2016-06-17: qty 30
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
## 2016-06-17 NOTE — ED Triage Notes (Signed)
Pt c/o chest pain and abdominal pain, reports nausea and diaphoresis. Hx "skipping beats" with heart.

## 2016-06-17 NOTE — ED Notes (Signed)
Pt states he drinks heavily, frequently binge drinks. Last ETOH use 6/6 at midnight

## 2016-06-17 NOTE — Discharge Instructions (Addendum)
Please make sure to establish with a primary care doctor.  I have included a list of folks that in the community that can help you get off of alcohol.  You are being discharged with something to take to protect your stomach.

## 2016-10-31 ENCOUNTER — Emergency Department (HOSPITAL_COMMUNITY)
Admission: EM | Admit: 2016-10-31 | Discharge: 2016-10-31 | Disposition: A | Discharge status: Home / Self Care | Payer: Self-pay | Attending: Emergency Medicine | Admitting: Emergency Medicine

## 2016-10-31 ENCOUNTER — Encounter (HOSPITAL_COMMUNITY): Payer: Self-pay | Admitting: Emergency Medicine

## 2016-10-31 ENCOUNTER — Emergency Department (HOSPITAL_COMMUNITY): Payer: Self-pay

## 2016-10-31 DIAGNOSIS — R070 Pain in throat: Secondary | ICD-10-CM | POA: Insufficient documentation

## 2016-10-31 DIAGNOSIS — Z79899 Other long term (current) drug therapy: Secondary | ICD-10-CM | POA: Insufficient documentation

## 2016-10-31 HISTORY — DX: Major depressive disorder, single episode, unspecified: F32.9

## 2016-10-31 HISTORY — DX: Depression, unspecified: F32.A
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
## 2016-10-31 NOTE — ED Notes (Signed)
Patient transported to X-ray 
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
## 2016-10-31 NOTE — Discharge Instructions (Signed)
X-ray negative here today. Can return here for any new concerns.

## 2016-12-19 ENCOUNTER — Encounter (HOSPITAL_COMMUNITY): Payer: Self-pay | Admitting: Emergency Medicine

## 2016-12-19 ENCOUNTER — Emergency Department (HOSPITAL_COMMUNITY)
Admission: EM | Admit: 2016-12-19 | Discharge: 2016-12-19 | Disposition: A | Discharge status: Home / Self Care | Payer: Self-pay | Attending: Emergency Medicine | Admitting: Emergency Medicine

## 2016-12-19 ENCOUNTER — Emergency Department (HOSPITAL_COMMUNITY): Payer: Self-pay

## 2016-12-19 ENCOUNTER — Other Ambulatory Visit: Payer: Self-pay

## 2016-12-19 DIAGNOSIS — Z79899 Other long term (current) drug therapy: Secondary | ICD-10-CM | POA: Insufficient documentation

## 2016-12-19 DIAGNOSIS — F909 Attention-deficit hyperactivity disorder, unspecified type: Secondary | ICD-10-CM | POA: Insufficient documentation

## 2016-12-19 DIAGNOSIS — R0789 Other chest pain: Secondary | ICD-10-CM | POA: Insufficient documentation

## 2016-12-19 LAB — CBC WITH DIFFERENTIAL/PLATELET
BASOS ABS: 0 10*3/uL (ref 0.0–0.1)
Basophils Relative: 0 %
Eosinophils Absolute: 0.1 10*3/uL (ref 0.0–0.7)
Eosinophils Relative: 4 %
HEMATOCRIT: 39.7 % (ref 39.0–52.0)
HEMOGLOBIN: 13.3 g/dL (ref 13.0–17.0)
LYMPHS ABS: 1.8 10*3/uL (ref 0.7–4.0)
LYMPHS PCT: 49 %
MCH: 29.4 pg (ref 26.0–34.0)
MCHC: 33.5 g/dL (ref 30.0–36.0)
MCV: 87.8 fL (ref 78.0–100.0)
Monocytes Absolute: 0.4 10*3/uL (ref 0.1–1.0)
Monocytes Relative: 11 %
NEUTROS ABS: 1.3 10*3/uL — AB (ref 1.7–7.7)
NEUTROS PCT: 36 %
PLATELETS: 201 10*3/uL (ref 150–400)
RBC: 4.52 MIL/uL (ref 4.22–5.81)
RDW: 13 % (ref 11.5–15.5)
WBC: 3.7 10*3/uL — AB (ref 4.0–10.5)

## 2016-12-19 LAB — COMPREHENSIVE METABOLIC PANEL
ALBUMIN: 4 g/dL (ref 3.5–5.0)
ALK PHOS: 53 U/L (ref 38–126)
ALT: 38 U/L (ref 17–63)
ANION GAP: 13 (ref 5–15)
AST: 49 U/L — ABNORMAL HIGH (ref 15–41)
BUN: 9 mg/dL (ref 6–20)
CALCIUM: 9 mg/dL (ref 8.9–10.3)
CHLORIDE: 101 mmol/L (ref 101–111)
CO2: 24 mmol/L (ref 22–32)
Creatinine, Ser: 0.83 mg/dL (ref 0.61–1.24)
GFR calc Af Amer: >60 mL/min (ref >60)
GFR calc non Af Amer: >60 mL/min (ref >60)
GLUCOSE: 89 mg/dL (ref 65–99)
Potassium: 3.7 mmol/L (ref 3.5–5.1)
SODIUM: 138 mmol/L (ref 135–145)
Total Bilirubin: 0.3 mg/dL (ref 0.3–1.2)
Total Protein: 8.1 g/dL (ref 6.5–8.1)

## 2016-12-19 LAB — I-STAT TROPONIN, ED: Troponin i, poc: 0 ng/mL (ref 0.00–0.08)

## 2016-12-19 LAB — LIPASE, BLOOD: Lipase: 26 U/L (ref 11–51)

## 2016-12-19 MED ORDER — SODIUM CHLORIDE 0.9 % IV BOLUS (SEPSIS)
1000.0000 mL | Freq: Once | INTRAVENOUS | Status: AC
  Administered 2016-12-19: 1000 mL via INTRAVENOUS

## 2016-12-19 MED ORDER — IBUPROFEN 800 MG PO TABS: 800.0000 mg | ORAL_TABLET | Freq: Three times a day (TID) | ORAL | 0 refills | Status: DC | PRN

## 2016-12-19 MED ORDER — KETOROLAC TROMETHAMINE 30 MG/ML IJ SOLN
30.0000 mg | Freq: Once | INTRAMUSCULAR | Status: AC
  Administered 2016-12-19: 30 mg via INTRAVENOUS
  Filled 2016-12-19: qty 1

## 2016-12-19 MED ORDER — ONDANSETRON HCL 4 MG/2ML IJ SOLN
4.0000 mg | Freq: Once | INTRAMUSCULAR | Status: AC
  Administered 2016-12-19: 4 mg via INTRAVENOUS
  Filled 2016-12-19: qty 2
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
## 2016-12-19 NOTE — Discharge Instructions (Signed)
Return here as needed. Your testing here today was normal. Your heart testing was normal. Follow up with a primary doctor.
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
## 2016-12-30 ENCOUNTER — Emergency Department (HOSPITAL_COMMUNITY)
Admission: EM | Admit: 2016-12-30 | Discharge: 2016-12-30 | Disposition: A | Discharge status: Home / Self Care | Payer: Medicaid Other | Attending: Emergency Medicine | Admitting: Emergency Medicine

## 2016-12-30 ENCOUNTER — Other Ambulatory Visit: Payer: Self-pay

## 2016-12-30 ENCOUNTER — Encounter (HOSPITAL_COMMUNITY): Payer: Self-pay | Admitting: Emergency Medicine

## 2016-12-30 ENCOUNTER — Emergency Department (HOSPITAL_COMMUNITY): Payer: Medicaid Other

## 2016-12-30 DIAGNOSIS — R197 Diarrhea, unspecified: Secondary | ICD-10-CM | POA: Insufficient documentation

## 2016-12-30 DIAGNOSIS — R112 Nausea with vomiting, unspecified: Secondary | ICD-10-CM | POA: Insufficient documentation

## 2016-12-30 DIAGNOSIS — K2100 Gastro-esophageal reflux disease with esophagitis, without bleeding: Secondary | ICD-10-CM

## 2016-12-30 DIAGNOSIS — R0602 Shortness of breath: Secondary | ICD-10-CM | POA: Insufficient documentation

## 2016-12-30 LAB — CBC
HCT: 40.6 % (ref 39.0–52.0)
Hemoglobin: 13.5 g/dL (ref 13.0–17.0)
MCH: 29.5 pg (ref 26.0–34.0)
MCHC: 33.3 g/dL (ref 30.0–36.0)
MCV: 88.6 fL (ref 78.0–100.0)
PLATELETS: 257 10*3/uL (ref 150–400)
RBC: 4.58 MIL/uL (ref 4.22–5.81)
RDW: 13.2 % (ref 11.5–15.5)
WBC: 5.3 10*3/uL (ref 4.0–10.5)

## 2016-12-30 LAB — BASIC METABOLIC PANEL
Anion gap: 10 (ref 5–15)
BUN: 9 mg/dL (ref 6–20)
CALCIUM: 9.4 mg/dL (ref 8.9–10.3)
CO2: 25 mmol/L (ref 22–32)
CREATININE: 0.83 mg/dL (ref 0.61–1.24)
Chloride: 102 mmol/L (ref 101–111)
Glucose, Bld: 98 mg/dL (ref 65–99)
Potassium: 3.7 mmol/L (ref 3.5–5.1)
SODIUM: 137 mmol/L (ref 135–145)

## 2016-12-30 LAB — HEPATIC FUNCTION PANEL
ALT: 70 U/L — AB (ref 17–63)
AST: 55 U/L — ABNORMAL HIGH (ref 15–41)
Albumin: 4.1 g/dL (ref 3.5–5.0)
Alkaline Phosphatase: 57 U/L (ref 38–126)
Total Bilirubin: 0.6 mg/dL (ref 0.3–1.2)
Total Protein: 8.4 g/dL — ABNORMAL HIGH (ref 6.5–8.1)

## 2016-12-30 LAB — I-STAT TROPONIN, ED: TROPONIN I, POC: 0 ng/mL (ref 0.00–0.08)

## 2016-12-30 LAB — LIPASE, BLOOD: LIPASE: 31 U/L (ref 11–51)

## 2016-12-30 MED ORDER — ONDANSETRON 4 MG PO TBDP
4.0000 mg | ORAL_TABLET | Freq: Once | ORAL | Status: AC
  Administered 2016-12-30: 4 mg via ORAL
  Filled 2016-12-30: qty 1

## 2016-12-30 MED ORDER — PANTOPRAZOLE SODIUM 40 MG PO TBEC: 40.0000 mg | DELAYED_RELEASE_TABLET | Freq: Every day | ORAL | 0 refills | Status: DC

## 2016-12-30 MED ORDER — GI COCKTAIL ~~LOC~~
30.0000 mL | Freq: Once | ORAL | Status: AC
  Administered 2016-12-30: 30 mL via ORAL
  Filled 2016-12-30: qty 30

## 2016-12-30 NOTE — ED Provider Notes (Signed)
[facility] EMERGENCY DEPARTMENT Provider Note   CSN: [id number] Arrival date & time: 12/30/16  4098     History   Chief Complaint Chief Complaint  Patient presents with  . Shortness of Breath  . Chest Pain    HPI Thomas Vaughn is a 26 y.o. male.  Patient is a 26 year old male with a history of ADHD, anxiety, depression and regular alcohol use presenting today with abdominal pain, nausea, vomiting, diarrhea, chest pain and shortness of breath.  Patient states that he was partying last night and drank a fair amount of liquor.  He woke up this morning and he had epigastric discomfort radiating up into his chest which made him short of breath.  He sat up and then went to the bathroom and had 2 episodes of vomiting and 2 episodes of diarrhea.  He tried using his albuterol inhaler without improvement.  That was approximately 3 hours ago.  He was fine yesterday and had no complaints before going to bed.  Currently he states the pain is a 7 out of 10 and sharp and burning in nature in the epigastric region in the lower substernal region.  He has not tried to eat anything today and does not know what makes it worse.  He has had episodes like this in the past after drinking a large amount of liquor but does not have it regularly.  Occasionally he wakes up in the mornings with an acidy taste in his mouth.  He does not take any over-the-counter NSAIDs or aspirin.  He denies any bloody stools or hematemesis.  No black stools.  No prior abdominal surgeries.  No fevers   The history is provided by the patient.    Past Medical History:  Diagnosis Date  . ADHD (attention deficit hyperactivity disorder)   . Anxiety   . Depression     Patient Active Problem List   Diagnosis Date Noted  . ADHD (attention deficit hyperactivity disorder)   . HYPOKALEMIA 07/29/2009  . ANEMIA OF OTHER CHRONIC DISEASE 07/29/2009  . GYNECOMASTIA 07/29/2009  . CHEST PAIN UNSPECIFIED 07/29/2009     History reviewed. No pertinent surgical history.     Home Medications    Prior to Admission medications   Medication Sig Start Date End Date Taking? Authorizing Provider  ibuprofen (ADVIL,MOTRIN) 800 MG tablet Take 1 tablet (800 mg total) by mouth every 8 (eight) hours as needed. 12/19/16   Lawyer, Cristal Deer, PA-C  methocarbamol (ROBAXIN-750) 750 MG tablet Take 1 tablet (750 mg total) by mouth 4 (four) times daily. Patient not taking: Reported on 12/19/2016 06/02/16   Lorre Nick, MD  pantoprazole (PROTONIX) 40 MG tablet Take 1 tablet (40 mg total) by mouth daily. Patient not taking: Reported on 12/19/2016 06/17/16 06/17/17  Raliegh Ip, DO    Family History No family history on file.  Social History Social History   Tobacco Use  . Smoking status: Never Smoker  . Smokeless tobacco: Never Used  Substance Use Topics  . Alcohol use: Yes    Alcohol/week: 4.2 oz    Types: 6 Cans of beer, 1 Shots of liquor per week    Comment: twice a week  . Drug use: Yes    Types: Marijuana, Methamphetamines     Allergies   Lorazepam   Review of Systems Review of Systems  All other systems reviewed and are negative.    Physical Exam Updated Vital Signs BP 131/72   Pulse 61   Temp 98.5  F (36.9 C) (Oral)   Resp 12   Ht 5\' 6"  (1.676 m)   Wt 72.6 kg (160 lb)   SpO2 99%   BMI 25.82 kg/m   Physical Exam  Constitutional: He is oriented to person, place, and time. He appears well-developed and well-nourished. No distress.  HENT:  Head: Normocephalic and atraumatic.  Mouth/Throat: Oropharynx is clear and moist.  Eyes: Conjunctivae and EOM are normal. Pupils are equal, round, and reactive to light.  Neck: Normal range of motion. Neck supple.  Cardiovascular: Normal rate, regular rhythm and intact distal pulses.  No murmur heard. Pulmonary/Chest: Effort normal and breath sounds normal. No respiratory distress. He has no wheezes. He has no rales. He exhibits no  tenderness.  Abdominal: Soft. He exhibits no distension. There is tenderness in the epigastric area. There is no rebound and no guarding.  Musculoskeletal: Normal range of motion. He exhibits no edema or tenderness.  Neurological: He is alert and oriented to person, place, and time.  Skin: Skin is warm and dry. No rash noted. No erythema.  Psychiatric: He has a normal mood and affect. His behavior is normal.  Nursing note and vitals reviewed.    ED Treatments / Results  Labs (all labs ordered are listed, but only abnormal results are displayed) Labs Reviewed  HEPATIC FUNCTION PANEL - Abnormal; Notable for the following components:      Result Value   Total Protein 8.4 (*)    AST 55 (*)    ALT 70 (*)    Bilirubin, Direct <0.1 (*)    All other components within normal limits  BASIC METABOLIC PANEL  CBC  LIPASE, BLOOD  I-STAT TROPONIN, ED    EKG  EKG Interpretation  Date/Time:  Thursday December 30 2016 06:18:47 EST Ventricular Rate:  72 PR Interval:  172 QRS Duration: 90 QT Interval:  392 QTC Calculation: 429 R Axis:   87 Text Interpretation:  Normal sinus rhythm with sinus arrhythmia Normal ECG No significant change since last tracing Confirmed by Gwyneth SproutPlunkett, Takari Lundahl ([id number]) on 12/30/2016 7:21:34 AM       Radiology Dg Chest 2 View  Result Date: 12/30/2016 CLINICAL DATA:  Shortness of breath. Abdominal pain radiating to the chest. EXAM: CHEST  2 VIEW COMPARISON:  Most recent radiograph 12/19/2016 FINDINGS: The cardiomediastinal contours are normal. The lungs are clear. Pulmonary vasculature is normal. No consolidation, pleural effusion, or pneumothorax. No acute osseous abnormalities are seen. EKG leads project over the chest. IMPRESSION: Unremarkable radiographs of the chest. Electronically Signed   By: Rubye OaksMelanie  Ehinger M.D.   On: 12/30/2016 06:47    Procedures Procedures (including critical care time)  Medications Ordered in ED Medications  gi cocktail  (Maalox,Lidocaine,Donnatal) (30 mLs Oral Given 12/30/16 0746)  ondansetron (ZOFRAN-ODT) disintegrating tablet 4 mg (4 mg Oral Given 12/30/16 0746)     Initial Impression / Assessment and Plan / ED Course  I have reviewed the triage vital signs and the nursing notes.  Pertinent labs & imaging results that were available during my care of the patient were reviewed by me and considered in my medical decision making (see chart for details).     Patient is a young healthy male with symptoms most consistent with GERD and possibly alcoholic gastritis.  The symptoms are most likely worse today because he drank heavily last night while partying with friends.  He states 3-4 times a week he will drink heavily and is always liquor.  He does not take over-the-counter NSAIDs or aspirin  products.  Low suspicion for perforated ulcer, appendicitis, diverticulitis or cholecystitis.  Will check LFTs and lipase to ensure no hepatitis or pancreatitis.  Patient had blood work focusing on cardiac causes of his symptoms which were all within normal limits.  He had a normal chest x-ray, EKG, troponin and BMP.  Low suspicion the patient's symptoms are cardiac or respiratory in nature.  Oxygen saturations 99% without tachypnea and clear breath sounds. Patient given GI cocktail and Zofran.  Discussed avoiding heavy drinking.  8:47 AM Mild elevation of LFT's which is most likely from alcohol use but lipase wnl.  Pt started on 2 week course of PPI.  Final Clinical Impressions(s) / ED Diagnoses   Final diagnoses:  Gastroesophageal reflux disease with esophagitis    ED Discharge Orders        Ordered    pantoprazole (PROTONIX) 40 MG tablet  Daily     12/30/16 0854       Gwyneth SproutPlunkett, Sidonia Nutter, MD 12/30/16 [id number]

## 2016-12-30 NOTE — ED Triage Notes (Signed)
Pt states he woke up today feeling SOB and central cp 7/10, no fever, chills, nausea or vomiting.

## 2017-01-12 ENCOUNTER — Encounter (HOSPITAL_COMMUNITY): Payer: Self-pay | Admitting: Emergency Medicine

## 2017-01-12 ENCOUNTER — Emergency Department (HOSPITAL_COMMUNITY)
Admission: EM | Admit: 2017-01-12 | Discharge: 2017-01-12 | Disposition: A | Discharge status: Home / Self Care | Payer: Medicaid Other | Attending: Emergency Medicine | Admitting: Emergency Medicine

## 2017-01-12 ENCOUNTER — Emergency Department (HOSPITAL_COMMUNITY): Payer: Medicaid Other

## 2017-01-12 DIAGNOSIS — D638 Anemia in other chronic diseases classified elsewhere: Secondary | ICD-10-CM | POA: Insufficient documentation

## 2017-01-12 DIAGNOSIS — R0602 Shortness of breath: Secondary | ICD-10-CM | POA: Insufficient documentation

## 2017-01-12 DIAGNOSIS — R0789 Other chest pain: Secondary | ICD-10-CM | POA: Insufficient documentation

## 2017-01-12 DIAGNOSIS — R079 Chest pain, unspecified: Secondary | ICD-10-CM

## 2017-01-12 LAB — BASIC METABOLIC PANEL
Anion gap: 8 (ref 5–15)
BUN: 8 mg/dL (ref 6–20)
CO2: 27 mmol/L (ref 22–32)
CREATININE: 0.82 mg/dL (ref 0.61–1.24)
Calcium: 9.4 mg/dL (ref 8.9–10.3)
Chloride: 103 mmol/L (ref 101–111)
GFR calc non Af Amer: >60 mL/min (ref >60)
Glucose, Bld: 92 mg/dL (ref 65–99)
POTASSIUM: 3.6 mmol/L (ref 3.5–5.1)
Sodium: 138 mmol/L (ref 135–145)

## 2017-01-12 LAB — CBC
HCT: 38.8 % — ABNORMAL LOW (ref 39.0–52.0)
Hemoglobin: 12.7 g/dL — ABNORMAL LOW (ref 13.0–17.0)
MCH: 29.5 pg (ref 26.0–34.0)
MCHC: 32.7 g/dL (ref 30.0–36.0)
MCV: 90.2 fL (ref 78.0–100.0)
PLATELETS: 211 10*3/uL (ref 150–400)
RBC: 4.3 MIL/uL (ref 4.22–5.81)
RDW: 13.9 % (ref 11.5–15.5)
WBC: 3.7 10*3/uL — AB (ref 4.0–10.5)

## 2017-01-12 LAB — I-STAT TROPONIN, ED: Troponin i, poc: 0 ng/mL (ref 0.00–0.08)

## 2017-01-12 MED ORDER — FAMOTIDINE 20 MG PO TABS: 20.0000 mg | ORAL_TABLET | Freq: Two times a day (BID) | ORAL | 1 refills | Status: DC

## 2017-01-12 NOTE — ED Triage Notes (Signed)
Pt reports chest pain and shortness of breath upon waking this morning, reports family member has been sick at home. VS stable in triage, respirations equal and unlabored. Hx smoking. Reports some cough and nausea.

## 2017-01-12 NOTE — ED Notes (Signed)
Pt staets he understands instructions. Home stable with steady gait. 

## 2017-01-12 NOTE — Discharge Instructions (Signed)
Take the medications as prescribed to see if that helps with the pain and discomfort.  Follow-up with a primary care doctor if your symptoms persist.  Try to avoid caffeine, alcohol late at night

## 2017-01-12 NOTE — ED Notes (Signed)
Pt ambulates to room 37 with easy steady gait.

## 2017-01-12 NOTE — ED Provider Notes (Signed)
[facility] Provider Note   CSN: [id number] Arrival date & time: 01/12/17  [id number]     History   Chief Complaint Chief Complaint  Patient presents with  . Shortness of Breath  . Chest Pain    HPI Thomas Vaughn is a 27 y.o. male.  HPI Pt presents to the ED with complaints of chest pain.  He noticed it this am.  It feels like a pressure.  He denies any leg swelling or fevers. He did vomit.  He does drink regularly and he did last night.   Other family members have been sick at home.  Hx of previous sx.  Several visits in the past presenting to the ED with chest pain.  Mother has history of heart disease but he does not what she has.  She was seen once and was treated as if she might have had a heart attack.  He is not sure of the results or if she had procedures like a heart cath. Past Medical History:  Diagnosis Date  . ADHD (attention deficit hyperactivity disorder)   . Anxiety   . Depression     Patient Active Problem List   Diagnosis Date Noted  . ADHD (attention deficit hyperactivity disorder)   . HYPOKALEMIA 07/29/2009  . ANEMIA OF OTHER CHRONIC DISEASE 07/29/2009  . GYNECOMASTIA 07/29/2009  . CHEST PAIN UNSPECIFIED 07/29/2009    History reviewed. No pertinent surgical history.     Home Medications    Prior to Admission medications   Medication Sig Start Date End Date Taking? Authorizing Provider  famotidine (PEPCID) 20 MG tablet Take 1 tablet (20 mg total) by mouth 2 (two) times daily. 01/12/17   Linwood DibblesKnapp, Jaelyn Cloninger, MD  pantoprazole (PROTONIX) 40 MG tablet Take 1 tablet (40 mg total) by mouth daily for 14 days. 12/30/16 01/13/17  Gwyneth SproutPlunkett, Whitney, MD    Family History History reviewed. No pertinent family history.  Social History Social History   Tobacco Use  . Smoking status: Never Smoker  . Smokeless tobacco: Never Used  Substance Use Topics  . Alcohol use: Yes    Alcohol/week: 4.2 oz    Types: 6 Cans of beer, 1  Shots of liquor per week    Comment: twice a week  . Drug use: Yes    Types: Marijuana, Methamphetamines     Allergies   Lorazepam   Review of Systems Review of Systems  All other systems reviewed and are negative.    Physical Exam Updated Vital Signs BP 129/85 (BP Location: Right Arm)   Pulse 64   Temp 98.8 F (37.1 C) (Oral)   Resp (!) 21   Ht 1.676 m (5\' 6" )   Wt 74.8 kg (165 lb)   SpO2 97%   BMI 26.63 kg/m   Physical Exam  Constitutional: He appears well-developed and well-nourished. No distress.  HENT:  Head: Normocephalic and atraumatic.  Right Ear: External ear normal.  Left Ear: External ear normal.  Eyes: Conjunctivae are normal. Right eye exhibits no discharge. Left eye exhibits no discharge. No scleral icterus.  Neck: Neck supple. No tracheal deviation present.  Cardiovascular: Normal rate, regular rhythm and intact distal pulses.  Pulmonary/Chest: Effort normal and breath sounds normal. No stridor. No respiratory distress. He has no wheezes. He has no rales.  Abdominal: Soft. Bowel sounds are normal. He exhibits no distension. There is no tenderness. There is no rebound and no guarding.  Musculoskeletal: He exhibits no edema or tenderness.  Neurological:  He is alert. He has normal strength. No cranial nerve deficit (no facial droop, extraocular movements intact, no slurred speech) or sensory deficit. He exhibits normal muscle tone. He displays no seizure activity. Coordination normal.  Skin: Skin is warm and dry. No rash noted.  Psychiatric: He has a normal mood and affect.  Nursing note and vitals reviewed.    ED Treatments / Results  Labs (all labs ordered are listed, but only abnormal results are displayed) Labs Reviewed  CBC - Abnormal; Notable for the following components:      Result Value   WBC 3.7 (*)    Hemoglobin 12.7 (*)    HCT 38.8 (*)    All other components within normal limits  BASIC METABOLIC PANEL  I-STAT TROPONIN, ED     EKG  EKG Interpretation  Date/Time:  Wednesday January 12 2017 06:17:16 EST Ventricular Rate:  64 PR Interval:  182 QRS Duration: 96 QT Interval:  406 QTC Calculation: 418 R Axis:   86 Text Interpretation:  Sinus rhythm with marked sinus arrhythmia with occasional Premature ventricular complexes Otherwise normal ECG pvc are new otherwise, no significant change Confirmed by Linwood Dibbles [phone]) on 01/12/2017 9:50:40 AM       Radiology Dg Chest 2 View  Result Date: 01/12/2017 CLINICAL DATA:  Initial evaluation for acute mid chest pain. EXAM: CHEST  2 VIEW COMPARISON:  A prior radiograph from 12/30/2016. FINDINGS: The cardiac and mediastinal silhouettes are stable in size and contour, and remain within normal limits. The lungs are normally inflated. No airspace consolidation, pleural effusion, or pulmonary edema is identified. There is no pneumothorax. No acute osseous abnormality identified. IMPRESSION: No active cardiopulmonary disease. Electronically Signed   By: Rise Mu M.D.   On: 01/12/2017 06:44    Procedures Procedures (including critical care time)  Medications Ordered in ED Medications - No data to display   Initial Impression / Assessment and Plan / ED Course  I have reviewed the triage vital signs and the nursing notes.  Pertinent labs & imaging results that were available during my care of the patient were reviewed by me and considered in my medical decision making (see chart for details).   Patient presented to the emergency room for evaluation of chest pain.  Patient denies any history of heart disease.  No history of PE or DVT.  Patient had chest pain and shortness of breath this morning.  He tried using inhaler without relief.  No wheezing on exam.  Incidental PVCs noted on the monitor as well as the EGD however the patient is asymptomatic.  I wonder if he may be having issues related to gastroesophageal reflux.  He does have history of alcohol use and has  had symptoms in the past.  We will try treatment with Pepcid AC.  Recommend follow-up with the primary care doctor.  At this time there does not appear to be any evidence of an acute emergency medical condition and the patient appears stable for discharge with appropriate outpatient follow up.   Final Clinical Impressions(s) / ED Diagnoses   Final diagnoses:  Chest pain, unspecified type    ED Discharge Orders        Ordered    famotidine (PEPCID) 20 MG tablet  2 times daily     01/12/17 1002       Linwood Dibbles, MD 01/12/17 1017

## 2017-01-26 ENCOUNTER — Emergency Department (HOSPITAL_COMMUNITY)
Admission: EM | Admit: 2017-01-26 | Discharge: 2017-01-26 | Disposition: A | Discharge status: Home / Self Care | Payer: Self-pay | Attending: Emergency Medicine | Admitting: Emergency Medicine

## 2017-01-26 ENCOUNTER — Encounter (HOSPITAL_COMMUNITY): Payer: Self-pay

## 2017-01-26 ENCOUNTER — Emergency Department (HOSPITAL_COMMUNITY): Payer: Self-pay

## 2017-01-26 ENCOUNTER — Other Ambulatory Visit: Payer: Self-pay

## 2017-01-26 DIAGNOSIS — K2920 Alcoholic gastritis without bleeding: Secondary | ICD-10-CM

## 2017-01-26 DIAGNOSIS — Z79899 Other long term (current) drug therapy: Secondary | ICD-10-CM | POA: Insufficient documentation

## 2017-01-26 LAB — URINALYSIS, ROUTINE W REFLEX MICROSCOPIC
BILIRUBIN URINE: NEGATIVE
Glucose, UA: NEGATIVE mg/dL
HGB URINE DIPSTICK: NEGATIVE
KETONES UR: NEGATIVE mg/dL
Leukocytes, UA: NEGATIVE
NITRITE: NEGATIVE
Protein, ur: NEGATIVE mg/dL
SPECIFIC GRAVITY, URINE: 1.023 (ref 1.005–1.030)
pH: 5 (ref 5.0–8.0)

## 2017-01-26 LAB — COMPREHENSIVE METABOLIC PANEL
ALK PHOS: 51 U/L (ref 38–126)
ALT: 51 U/L (ref 17–63)
ANION GAP: 13 (ref 5–15)
AST: 63 U/L — AB (ref 15–41)
Albumin: 4 g/dL (ref 3.5–5.0)
BILIRUBIN TOTAL: 0.8 mg/dL (ref 0.3–1.2)
BUN: 11 mg/dL (ref 6–20)
CALCIUM: 9.1 mg/dL (ref 8.9–10.3)
CO2: 22 mmol/L (ref 22–32)
CREATININE: 0.83 mg/dL (ref 0.61–1.24)
Chloride: 104 mmol/L (ref 101–111)
GFR calc Af Amer: >60 mL/min (ref >60)
GFR calc non Af Amer: >60 mL/min (ref >60)
GLUCOSE: 85 mg/dL (ref 65–99)
Potassium: 3.9 mmol/L (ref 3.5–5.1)
SODIUM: 139 mmol/L (ref 135–145)
TOTAL PROTEIN: 8.3 g/dL — AB (ref 6.5–8.1)

## 2017-01-26 LAB — CBC
HCT: 41.4 % (ref 39.0–52.0)
HEMOGLOBIN: 13.8 g/dL (ref 13.0–17.0)
MCH: 30.1 pg (ref 26.0–34.0)
MCHC: 33.3 g/dL (ref 30.0–36.0)
MCV: 90.2 fL (ref 78.0–100.0)
PLATELETS: 225 10*3/uL (ref 150–400)
RBC: 4.59 MIL/uL (ref 4.22–5.81)
RDW: 13.8 % (ref 11.5–15.5)
WBC: 2.9 10*3/uL — ABNORMAL LOW (ref 4.0–10.5)

## 2017-01-26 LAB — LIPASE, BLOOD: Lipase: 34 U/L (ref 11–51)

## 2017-01-26 MED ORDER — SODIUM CHLORIDE 0.9 % IV BOLUS (SEPSIS)
1000.0000 mL | Freq: Once | INTRAVENOUS | Status: AC
  Administered 2017-01-26: 1000 mL via INTRAVENOUS

## 2017-01-26 MED ORDER — SUCRALFATE 1 G PO TABS: 1.0000 g | ORAL_TABLET | Freq: Three times a day (TID) | ORAL | 0 refills | Status: DC

## 2017-01-26 MED ORDER — OMEPRAZOLE 20 MG PO CPDR: 20.0000 mg | DELAYED_RELEASE_CAPSULE | Freq: Every day | ORAL | 0 refills | Status: DC

## 2017-01-26 MED ORDER — GI COCKTAIL ~~LOC~~
30.0000 mL | Freq: Once | ORAL | Status: AC
  Administered 2017-01-26: 30 mL via ORAL
  Filled 2017-01-26: qty 30

## 2017-01-26 MED ORDER — HYDROCODONE-ACETAMINOPHEN 5-325 MG PO TABS
2.0000 | ORAL_TABLET | Freq: Once | ORAL | Status: AC
Start: 2017-01-26 — End: 2017-01-26
  Administered 2017-01-26: 2 via ORAL
  Filled 2017-01-26: qty 2

## 2017-01-26 MED ORDER — ONDANSETRON HCL 4 MG/2ML IJ SOLN
4.0000 mg | Freq: Once | INTRAMUSCULAR | Status: AC
  Administered 2017-01-26: 4 mg via INTRAVENOUS
  Filled 2017-01-26: qty 2

## 2017-01-26 MED ORDER — KETOROLAC TROMETHAMINE 15 MG/ML IJ SOLN
15.0000 mg | Freq: Once | INTRAMUSCULAR | Status: AC
  Administered 2017-01-26: 15 mg via INTRAVENOUS
  Filled 2017-01-26: qty 1

## 2017-01-26 MED ORDER — FAMOTIDINE 20 MG PO TABS
20.0000 mg | ORAL_TABLET | Freq: Once | ORAL | Status: AC
Start: 2017-01-26 — End: 2017-01-26
  Administered 2017-01-26: 20 mg via ORAL
  Filled 2017-01-26: qty 1

## 2017-01-26 NOTE — ED Provider Notes (Signed)
[facility] EMERGENCY DEPARTMENT Provider Note   CSN: [id number] Arrival date & time: 01/26/17  0559     History   Chief Complaint Chief Complaint  Patient presents with  . Emesis  . Shortness of Breath    HPI Thomas Vaughn is a 27 y.o. male.  HPI   27 year old male with past medical history of chronic alcohol use here with epigastric pain.  The patient states he was out with his friends last night when he drank significant amount of alcohol.  He awoke this morning with nausea, vomiting, and epigastric pain he reports generalized body aches, as well as cough and several episodes of emesis.  He describes the pain is an aching, cramp-like sensation in his epigastric area that radiates towards his back.  No chest pain specifically.  No history of coronary disease.  He denies any specific fevers that he is aware of.  Of note, he does have a 61-month-old son with a cough and congestion-like symptoms with fevers.  He reports generalized body aches.  Denies any urinary symptoms.  Denies any lower abdominal pain.  No penile discharge.  No rash.  Denies any suspicious food intakes.  Pain is worse with eating.  Past Medical History:  Diagnosis Date  . ADHD (attention deficit hyperactivity disorder)   . Anxiety   . Depression     Patient Active Problem List   Diagnosis Date Noted  . ADHD (attention deficit hyperactivity disorder)   . HYPOKALEMIA 07/29/2009  . ANEMIA OF OTHER CHRONIC DISEASE 07/29/2009  . GYNECOMASTIA 07/29/2009  . CHEST PAIN UNSPECIFIED 07/29/2009    History reviewed. No pertinent surgical history.     Home Medications    Prior to Admission medications   Medication Sig Start Date End Date Taking? Authorizing Provider  famotidine (PEPCID) 20 MG tablet Take 1 tablet (20 mg total) by mouth 2 (two) times daily. Patient not taking: Reported on 01/26/2017 01/12/17   Linwood Dibbles, MD  omeprazole (PRILOSEC) 20 MG capsule Take 1 capsule (20 mg total) by  mouth daily. 01/26/17   Shaune Pollack, MD  pantoprazole (PROTONIX) 40 MG tablet Take 1 tablet (40 mg total) by mouth daily for 14 days. 12/30/16 01/13/17  Gwyneth Sprout, MD  sucralfate (CARAFATE) 1 g tablet Take 1 tablet (1 g total) by mouth 4 (four) times daily -  with meals and at bedtime for 10 days. 01/26/17 02/05/17  Shaune Pollack, MD    Family History No family history on file.  Social History Social History   Tobacco Use  . Smoking status: Never Smoker  . Smokeless tobacco: Never Used  Substance Use Topics  . Alcohol use: Yes    Alcohol/week: 4.2 oz    Types: 6 Cans of beer, 1 Shots of liquor per week    Comment: twice a week  . Drug use: Yes    Types: Marijuana, Methamphetamines     Allergies   Lorazepam   Review of Systems Review of Systems  Constitutional: Positive for fatigue.  Respiratory: Positive for cough.   Gastrointestinal: Positive for abdominal pain, nausea and vomiting.  Musculoskeletal: Positive for myalgias.  Neurological: Positive for weakness.  All other systems reviewed and are negative.    Physical Exam Updated Vital Signs BP 136/82   Pulse (!) 52   Temp 98 F (36.7 C) (Oral)   Resp 18   SpO2 98%   Physical Exam  Constitutional: He is oriented to person, place, and time. He appears well-developed and well-nourished.  No distress.  HENT:  Head: Normocephalic and atraumatic.  Eyes: Conjunctivae are normal.  Neck: Neck supple.  Cardiovascular: Normal rate, regular rhythm and normal heart sounds. Exam reveals no friction rub.  No murmur heard. Pulmonary/Chest: Effort normal and breath sounds normal. No respiratory distress. He has no wheezes. He has no rales.  Abdominal: He exhibits no distension. There is tenderness in the epigastric area. There is no rigidity, no rebound, no guarding and no CVA tenderness.  Musculoskeletal: He exhibits no edema.  Neurological: He is alert and oriented to person, place, and time. He exhibits normal  muscle tone.  Skin: Skin is warm. Capillary refill takes less than 2 seconds.  Psychiatric: He has a normal mood and affect.  Nursing note and vitals reviewed.    ED Treatments / Results  Labs (all labs ordered are listed, but only abnormal results are displayed) Labs Reviewed  COMPREHENSIVE METABOLIC PANEL - Abnormal; Notable for the following components:      Result Value   Total Protein 8.3 (*)    AST 63 (*)    All other components within normal limits  CBC - Abnormal; Notable for the following components:   WBC 2.9 (*)    All other components within normal limits  LIPASE, BLOOD  URINALYSIS, ROUTINE W REFLEX MICROSCOPIC    EKG  EKG Interpretation  Date/Time:  Wednesday January 26 2017 06:20:47 EST Ventricular Rate:  63 PR Interval:  180 QRS Duration: 96 QT Interval:  414 QTC Calculation: 423 R Axis:   87 Text Interpretation:  Sinus rhythm with sinus arrhythmia with occasional Premature ventricular complexes Otherwise normal ECG No significant change since last tracing Confirmed by Shaune PollackIsaacs, Chole Driver [phone]) on 01/26/2017 8:00:30 AM       Radiology Dg Chest 2 View  Result Date: 01/26/2017 CLINICAL DATA:  Shortness of breath and chest pain EXAM: CHEST  2 VIEW COMPARISON:  January 12, 2017 FINDINGS: There is no edema or consolidation. The heart size and pulmonary vascularity are normal. No adenopathy. No pneumothorax. No bone lesions. IMPRESSION: No edema or consolidation. Electronically Signed   By: Bretta BangWilliam  Woodruff III M.D.   On: 01/26/2017 07:44    Procedures Procedures (including critical care time)  Medications Ordered in ED Medications  famotidine (PEPCID) tablet 20 mg (not administered)  HYDROcodone-acetaminophen (NORCO/VICODIN) 5-325 MG per tablet 2 tablet (not administered)  sodium chloride 0.9 % bolus 1,000 mL (0 mLs Intravenous Stopped 01/26/17 0941)  ondansetron (ZOFRAN) injection 4 mg (4 mg Intravenous Given 01/26/17 0835)  gi cocktail  (Maalox,Lidocaine,Donnatal) (30 mLs Oral Given 01/26/17 0837)  ketorolac (TORADOL) 15 MG/ML injection 15 mg (15 mg Intravenous Given 01/26/17 0836)     Initial Impression / Assessment and Plan / ED Course  I have reviewed the triage vital signs and the nursing notes.  Pertinent labs & imaging results that were available during my care of the patient were reviewed by me and considered in my medical decision making (see chart for details).     27 year old male with no significant past medical history here with epigastric pain, nausea, and vomiting.  I suspect this is likely alcohol related and possibly secondary to gastritis.  Lipase normal, but chronic pancreatitis is also on the differential.  He reports chronic, heavy alcohol use and symptoms began after drinking last night.  Of note, he does have mild elevation of his AST which I suspect is secondary to alcohol use.  He has had elevated LFTs in the past, however, so will refer to  GI as an outpatient.  He has normal bilirubin, no right upper quadrant tenderness or signs of acute cholecystitis.  Otherwise, will give fluids, symptomatic treatment, and reassess.  He does have sick contacts so viral illness is also on the differential.  Given that he has nausea, vomiting, and no fevers, will hold on empiric treatment for the flu at this time.  He has no risk factors for severe flu.  Patient feels improved with IV fluids.  His lab work is very reassuring.  Of note, he does have mild AST elevation as mentioned, but I suspect most of his symptoms are alcohol related.  Patient is tolerating p.o. without difficulty.  Will give him antacids.  I advised him to avoid NSAIDs.  Supportive care at home.  I also encouraged PCP follow-up and cessation of alcohol.  Final Clinical Impressions(s) / ED Diagnoses   Final diagnoses:  Acute alcoholic gastritis without hemorrhage    ED Discharge Orders        Ordered    omeprazole (PRILOSEC) 20 MG capsule  Daily      01/26/17 0950    sucralfate (CARAFATE) 1 g tablet  3 times daily with meals & bedtime     01/26/17 0950       Shaune Pollack, MD 01/26/17 [phone]

## 2017-01-26 NOTE — ED Triage Notes (Signed)
Pt states " I just dont feel good at all" c/o of vomiting several times today, SOB, abd pain, chills, cough.

## 2017-01-26 NOTE — Discharge Instructions (Signed)
I suspect her symptoms are due to gastritis or inflammation of your stomach.  This is likely due to alcohol, but can also be a virus given that your child is sick.  I prescribed medications that should help with your symptoms.  Try to avoid alcohol over the next week, at least.  Avoid spicy foods as well.  For your pain, I recommend Tylenol.  Make sure you do not take more than 4000 mg or 4 g of Tylenol a day.  Try to stay away from aspirin or Advil as this can cause further stomach irritation.  Drink plenty of fluids.  I think it is important to follow-up with a primary care doctor as you do have some mild inflammation of your liver as well.  This is likely also alcohol related.

## 2017-01-30 IMAGING — DX DG CHEST 2V
2 series · 2 of 2 positions shown · non-contrast
Comparison: 03/22/2015

CLINICAL DATA: Chest pain for 2 days

EXAM:
CHEST  2 VIEW

[chest pa]
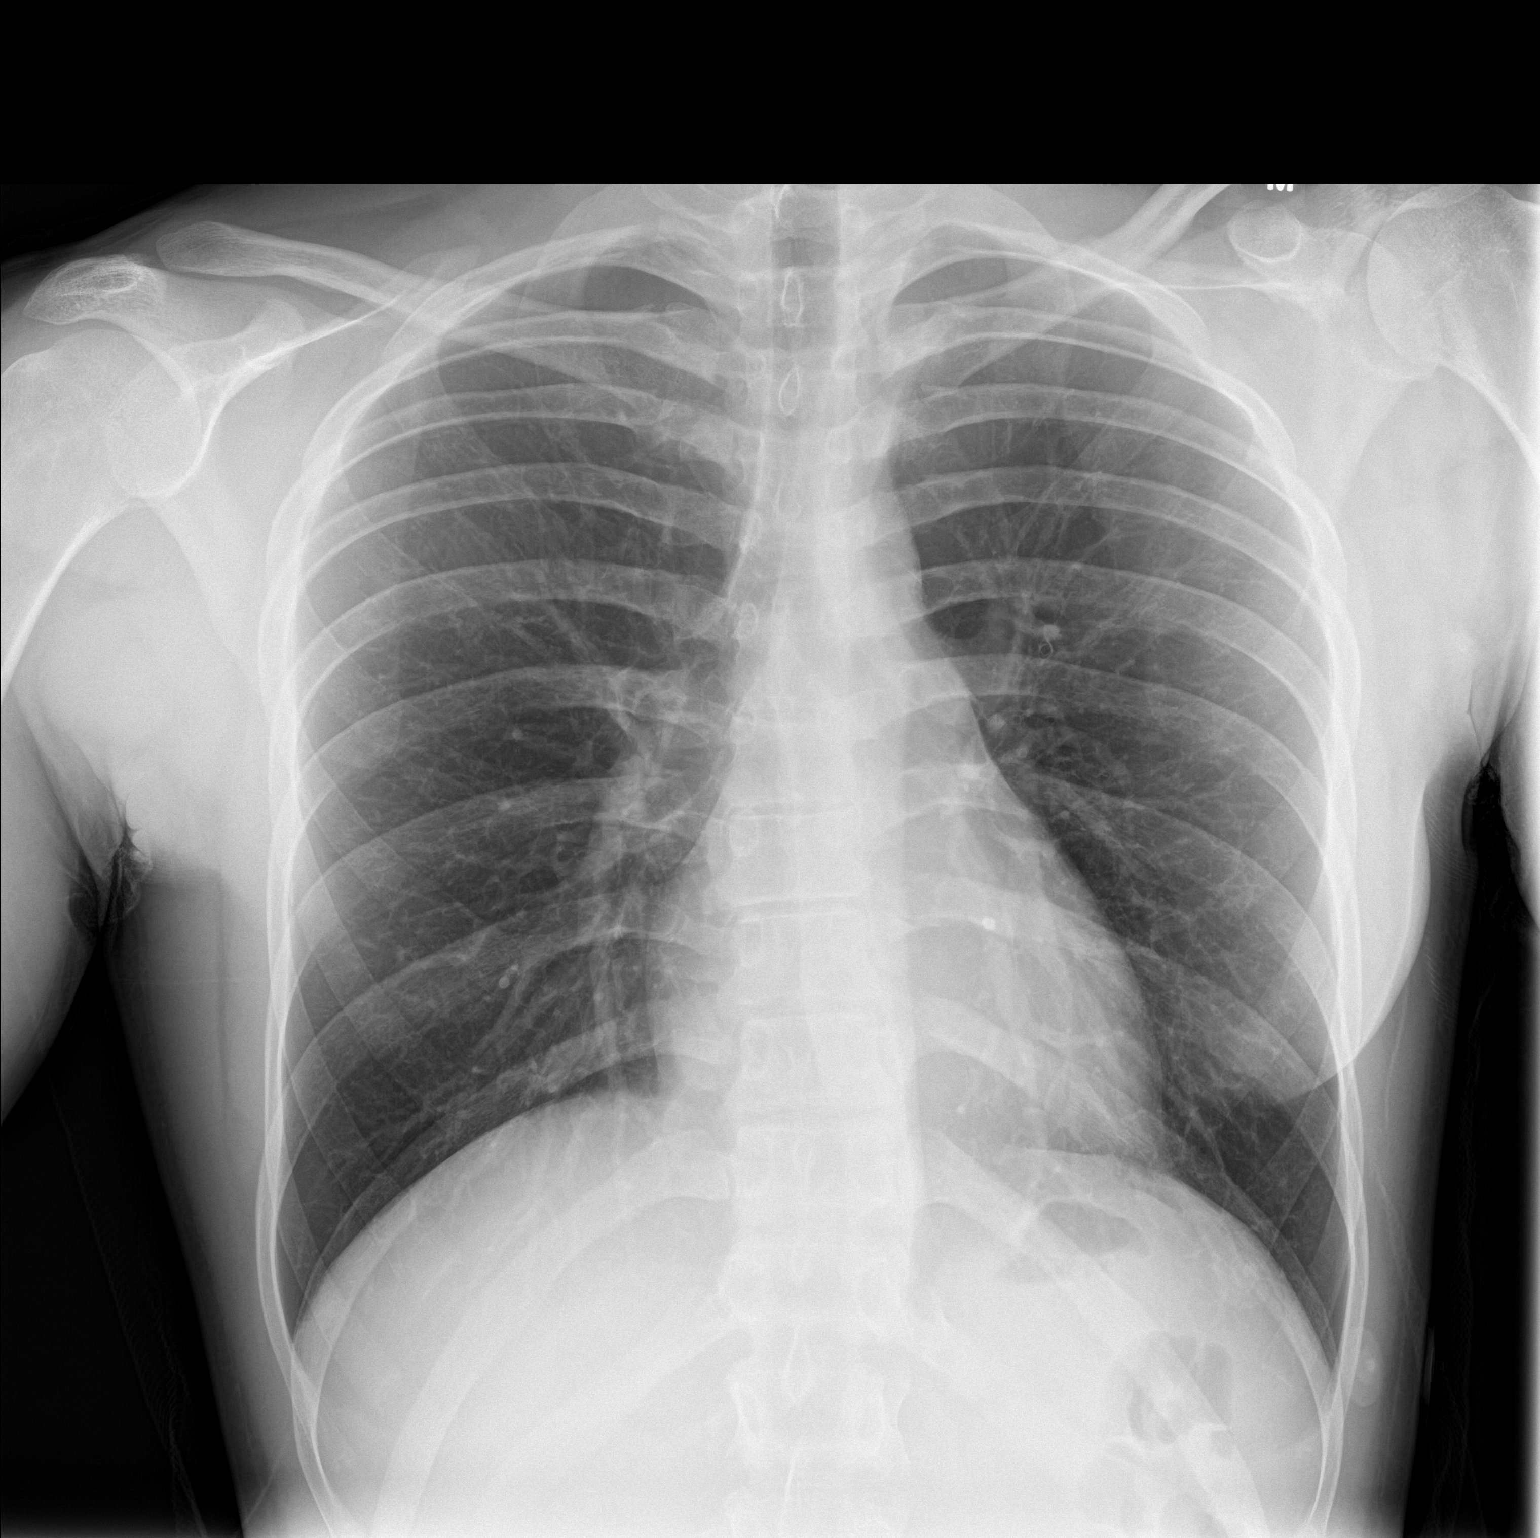

[chest lat]
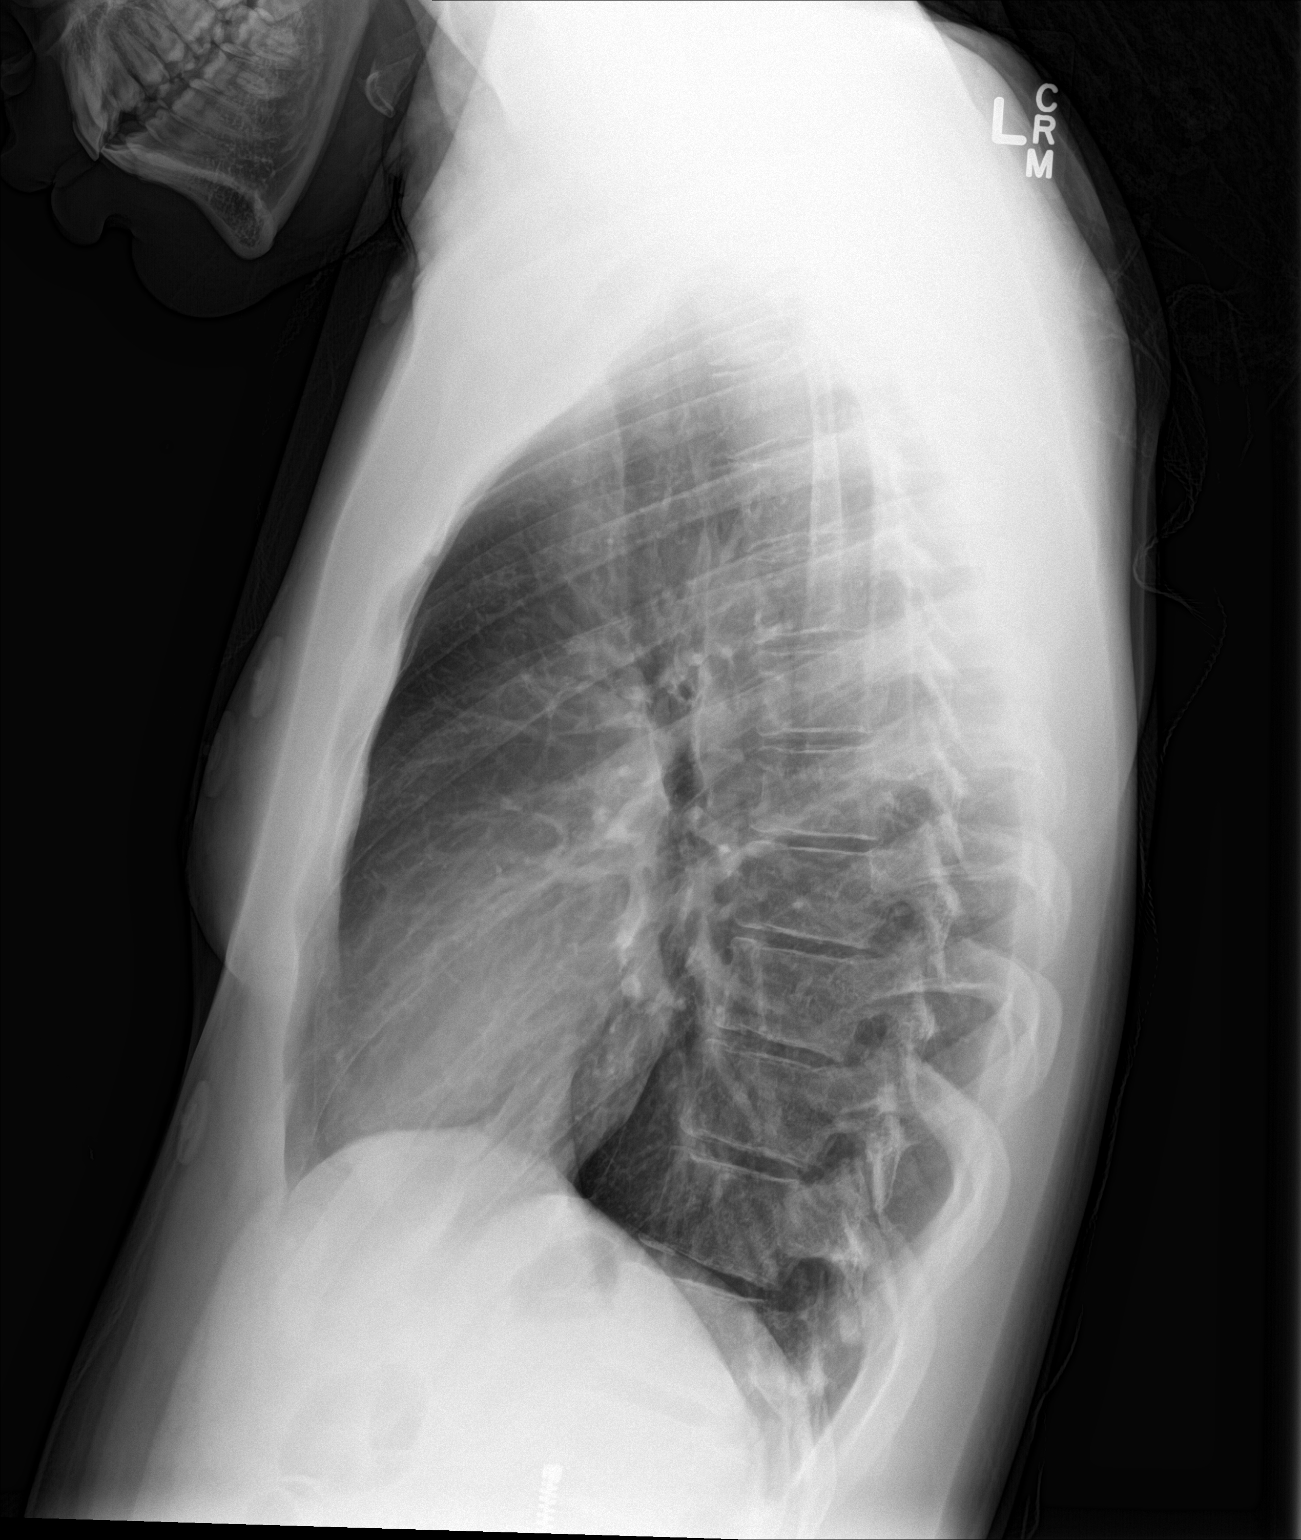

[2 of 2 positions shown; findings below may reference images not displayed]

FINDINGS: The heart size and mediastinal contours are within normal limits.
Both lungs are clear. The visualized skeletal structures are
unremarkable.
IMPRESSION: No active cardiopulmonary disease.

## 2017-02-13 ENCOUNTER — Other Ambulatory Visit: Payer: Self-pay

## 2017-02-13 ENCOUNTER — Emergency Department (HOSPITAL_COMMUNITY)
Admission: EM | Admit: 2017-02-13 | Discharge: 2017-02-14 | Disposition: A | Discharge status: Home / Self Care | Payer: Medicaid Other | Attending: Emergency Medicine | Admitting: Emergency Medicine

## 2017-02-13 ENCOUNTER — Encounter (HOSPITAL_COMMUNITY): Payer: Self-pay | Admitting: Emergency Medicine

## 2017-02-13 ENCOUNTER — Emergency Department (HOSPITAL_COMMUNITY): Payer: Medicaid Other

## 2017-02-13 DIAGNOSIS — R1030 Lower abdominal pain, unspecified: Secondary | ICD-10-CM | POA: Insufficient documentation

## 2017-02-13 DIAGNOSIS — Z79899 Other long term (current) drug therapy: Secondary | ICD-10-CM | POA: Insufficient documentation

## 2017-02-13 DIAGNOSIS — F909 Attention-deficit hyperactivity disorder, unspecified type: Secondary | ICD-10-CM | POA: Insufficient documentation

## 2017-02-13 LAB — CBC
HEMATOCRIT: 42.3 % (ref 39.0–52.0)
HEMOGLOBIN: 13.9 g/dL (ref 13.0–17.0)
MCH: 29.7 pg (ref 26.0–34.0)
MCHC: 32.9 g/dL (ref 30.0–36.0)
MCV: 90.4 fL (ref 78.0–100.0)
Platelets: 208 10*3/uL (ref 150–400)
RBC: 4.68 MIL/uL (ref 4.22–5.81)
RDW: 13.8 % (ref 11.5–15.5)
WBC: 3.1 10*3/uL — ABNORMAL LOW (ref 4.0–10.5)

## 2017-02-13 LAB — URINALYSIS, ROUTINE W REFLEX MICROSCOPIC
BILIRUBIN URINE: NEGATIVE
GLUCOSE, UA: NEGATIVE mg/dL
HGB URINE DIPSTICK: NEGATIVE
Ketones, ur: NEGATIVE mg/dL
Leukocytes, UA: NEGATIVE
Nitrite: NEGATIVE
PH: 6 (ref 5.0–8.0)
Protein, ur: NEGATIVE mg/dL
Specific Gravity, Urine: 1.018 (ref 1.005–1.030)

## 2017-02-13 LAB — COMPREHENSIVE METABOLIC PANEL
ALBUMIN: 4 g/dL (ref 3.5–5.0)
ALK PHOS: 54 U/L (ref 38–126)
ALT: 57 U/L (ref 17–63)
ANION GAP: 14 (ref 5–15)
AST: 71 U/L — ABNORMAL HIGH (ref 15–41)
BUN: 5 mg/dL — ABNORMAL LOW (ref 6–20)
CALCIUM: 8.9 mg/dL (ref 8.9–10.3)
CHLORIDE: 100 mmol/L — AB (ref 101–111)
CO2: 24 mmol/L (ref 22–32)
Creatinine, Ser: 0.84 mg/dL (ref 0.61–1.24)
GFR calc Af Amer: >60 mL/min (ref >60)
GFR calc non Af Amer: >60 mL/min (ref >60)
GLUCOSE: 86 mg/dL (ref 65–99)
Potassium: 3.7 mmol/L (ref 3.5–5.1)
SODIUM: 138 mmol/L (ref 135–145)
Total Bilirubin: 0.6 mg/dL (ref 0.3–1.2)
Total Protein: 8.1 g/dL (ref 6.5–8.1)

## 2017-02-13 LAB — LIPASE, BLOOD: LIPASE: 33 U/L (ref 11–51)

## 2017-02-13 MED ORDER — IOPAMIDOL (ISOVUE-300) INJECTION 61%
INTRAVENOUS | Status: AC
  Administered 2017-02-13: 100 mL
  Filled 2017-02-13: qty 100

## 2017-02-13 NOTE — ED Provider Notes (Signed)
[facility] EMERGENCY DEPARTMENT Provider Note   CSN: [id number] Arrival date & time: 02/13/17  1616   History   Chief Complaint Chief Complaint  Patient presents with  . Abdominal Pain    HPI Thomas Vaughn is a 27 y.o. male who presents with abdominal pain. PMH significant for alcohol abuse. He states that for the past week he has had constant lower abdominal pain. Nothing makes it better or worse. He has not had this pain before. It is over the suprapubic area and radiates to the bilateral inguinal areas. He cannot describe the pain, just says "something's not right". He denies difficulty urinating, dysuria, hematuria, frequency. He has the feeling of not completely emptying his bladder. Urinating does not improve his symptoms. He denies penile pain, testicular pain, penile discharge. No fever, chills, N/V/D.   HPI  Past Medical History:  Diagnosis Date  . ADHD (attention deficit hyperactivity disorder)   . Anxiety   . Depression     Patient Active Problem List   Diagnosis Date Noted  . ADHD (attention deficit hyperactivity disorder)   . HYPOKALEMIA 07/29/2009  . ANEMIA OF OTHER CHRONIC DISEASE 07/29/2009  . GYNECOMASTIA 07/29/2009  . CHEST PAIN UNSPECIFIED 07/29/2009    History reviewed. No pertinent surgical history.   Home Medications    Prior to Admission medications   Medication Sig Start Date End Date Taking? Authorizing Provider  famotidine (PEPCID) 20 MG tablet Take 1 tablet (20 mg total) by mouth 2 (two) times daily. Patient not taking: Reported on 01/26/2017 01/12/17   Linwood Dibbles, MD  omeprazole (PRILOSEC) 20 MG capsule Take 1 capsule (20 mg total) by mouth daily. 01/26/17   Shaune Pollack, MD  pantoprazole (PROTONIX) 40 MG tablet Take 1 tablet (40 mg total) by mouth daily for 14 days. 12/30/16 01/13/17  Gwyneth Sprout, MD  sucralfate (CARAFATE) 1 g tablet Take 1 tablet (1 g total) by mouth 4 (four) times daily -  with meals and at bedtime  for 10 days. 01/26/17 02/05/17  Shaune Pollack, MD    Family History History reviewed. No pertinent family history.  Social History Social History   Tobacco Use  . Smoking status: Never Smoker  . Smokeless tobacco: Never Used  Substance Use Topics  . Alcohol use: Yes    Alcohol/week: 4.2 oz    Types: 6 Cans of beer, 1 Shots of liquor per week    Comment: twice a week  . Drug use: Yes    Types: Marijuana, Methamphetamines     Allergies   Lorazepam   Review of Systems Review of Systems  Constitutional: Negative for chills and fever.  Respiratory: Negative for shortness of breath.   Cardiovascular: Negative for chest pain.  Gastrointestinal: Positive for abdominal pain. Negative for diarrhea, nausea and vomiting.  Genitourinary: Negative for difficulty urinating, dysuria, flank pain, hematuria, penile pain and testicular pain.  All other systems reviewed and are negative.    Physical Exam Updated Vital Signs BP 134/84   Pulse 67   Temp 99 F (37.2 C) (Oral)   Resp 16   Ht 5\' 6"  (1.676 m)   Wt 70.3 kg (155 lb)   SpO2 98%   BMI 25.02 kg/m   Physical Exam  Constitutional: He is oriented to person, place, and time. He appears well-developed and well-nourished. No distress.  HENT:  Head: Normocephalic and atraumatic.  Eyes: Conjunctivae are normal. Pupils are equal, round, and reactive to light. Right eye exhibits no discharge. Left eye  exhibits no discharge. No scleral icterus.  Neck: Normal range of motion.  Cardiovascular: Normal rate and regular rhythm. Exam reveals no gallop and no friction rub.  No murmur heard. Pulmonary/Chest: Effort normal and breath sounds normal. No respiratory distress.  Abdominal: Soft. Bowel sounds are normal. He exhibits no distension and no mass. There is tenderness (Suprapubic tenderness). There is no rebound and no guarding. No hernia.  Genitourinary:  Genitourinary Comments: No inguinal lymphadenopathy or inguinal hernia noted.  Tenderness over bilateral inguinal region. Normal circumcised penis free of lesions or rash. Testicles are nontender with normal lie. Normal scrotal appearance. No obvious discharge noted. Chaperone present during exam.    Neurological: He is alert and oriented to person, place, and time.  Skin: Skin is warm and dry.  Psychiatric: He has a normal mood and affect. His behavior is normal.  Nursing note and vitals reviewed.    ED Treatments / Results  Labs (all labs ordered are listed, but only abnormal results are displayed) Labs Reviewed  COMPREHENSIVE METABOLIC PANEL - Abnormal; Notable for the following components:      Result Value   Chloride 100 (*)    BUN 5 (*)    AST 71 (*)    All other components within normal limits  CBC - Abnormal; Notable for the following components:   WBC 3.1 (*)    All other components within normal limits  LIPASE, BLOOD  URINALYSIS, ROUTINE W REFLEX MICROSCOPIC  GC/CHLAMYDIA PROBE AMP ([place]) NOT AT [facility]    EKG  EKG Interpretation None       Radiology No results found.  Procedures Procedures (including critical care time)  Medications Ordered in ED Medications - No data to display   Initial Impression / Assessment and Plan / ED Course  I have reviewed the triage vital signs and the nursing notes.  Pertinent labs & imaging results that were available during my care of the patient were reviewed by me and considered in my medical decision making (see chart for details).  [age] year old male presents with lower abdominal pain. Unclear etiology. Vitals are normal. He is tender in the periumbilical and suprapubic area. GU exam is unremarkable. Bladder scan is normal. Labs are overall unremarkable. UA is clean. G&C sent. Will order CT of A&P which is pending at shift change. Care transferred to [facility] who will dispo accordingly.  Final Clinical Impressions(s) / ED Diagnoses   Final diagnoses:  Lower abdominal pain    ED Discharge  Orders    None       Bethel BornGekas, Stina Gane Marie, PA-C 02/14/17 0931    Loren RacerYelverton, David, MD 02/17/17 [phone]

## 2017-02-13 NOTE — ED Triage Notes (Signed)
Patient complains of of increasing pain in lower abdominal area to pubic area. Pain reported at 9. Associated nausea, no complaint of pain. Able to keep down food and liquids. Discomfort felt when walking.

## 2017-02-13 NOTE — ED Notes (Signed)
Bladder scan 0mL.

## 2017-02-13 NOTE — ED Provider Notes (Signed)
THIS IS A SHARED VISIT WITH Thomas Vaughn, PAC PATIENT AWAITING CT SCAN @ 10:00PM  Thomas Vaughn is a 27 y.o. male here tonight with abdominal pain. Patient evaluated by Thomas HartKelly Vaughn, PAC. Patient awaiting CT scan. If CT is normal plan is to d/c patient home with instructions for f/u.   BP 134/84   Pulse 67   Temp 99 F (37.2 C) (Oral)   Resp 16   Ht 5\' 6"  (1.676 m)   Wt 70.3 kg (155 lb)   SpO2 98%   BMI 25.02 kg/m   Results for orders placed or performed during the hospital encounter of 02/13/17 (from the past 24 hour(s))  Lipase, blood     Status: None   Collection Time: 02/13/17  4:49 PM  Result Value Ref Range   Lipase 33 11 - 51 U/L  Comprehensive metabolic panel     Status: Abnormal   Collection Time: 02/13/17  4:49 PM  Result Value Ref Range   Sodium 138 135 - 145 mmol/L   Potassium 3.7 3.5 - 5.1 mmol/L   Chloride 100 (L) 101 - 111 mmol/L   CO2 24 22 - 32 mmol/L   Glucose, Bld 86 65 - 99 mg/dL   BUN 5 (L) 6 - 20 mg/dL   Creatinine, Ser 1.610.84 0.61 - 1.24 mg/dL   Calcium 8.9 8.9 - 09.610.3 mg/dL   Total Protein 8.1 6.5 - 8.1 g/dL   Albumin 4.0 3.5 - 5.0 g/dL   AST 71 (H) 15 - 41 U/L   ALT 57 17 - 63 U/L   Alkaline Phosphatase 54 38 - 126 U/L   Total Bilirubin 0.6 0.3 - 1.2 mg/dL   GFR calc non Af Amer >60 >60 mL/min   GFR calc Af Amer >60 >60 mL/min   Anion gap 14 5 - 15  CBC     Status: Abnormal   Collection Time: 02/13/17  4:49 PM  Result Value Ref Range   WBC 3.1 (L) 4.0 - 10.5 K/uL   RBC 4.68 4.22 - 5.81 MIL/uL   Hemoglobin 13.9 13.0 - 17.0 g/dL   HCT 04.542.3 40.939.0 - 81.152.0 %   MCV 90.4 78.0 - 100.0 fL   MCH 29.7 26.0 - 34.0 pg   MCHC 32.9 30.0 - 36.0 g/dL   RDW 91.413.8 78.211.5 - 95.615.5 %   Platelets 208 150 - 400 K/uL  Urinalysis, Routine w reflex microscopic     Status: None   Collection Time: 02/13/17  4:52 PM  Result Value Ref Range   Color, Urine YELLOW YELLOW   APPearance CLEAR CLEAR   Specific Gravity, Urine 1.018 1.005 - 1.030   pH 6.0 5.0 - 8.0   Glucose,  UA NEGATIVE NEGATIVE mg/dL   Hgb urine dipstick NEGATIVE NEGATIVE   Bilirubin Urine NEGATIVE NEGATIVE   Ketones, ur NEGATIVE NEGATIVE mg/dL   Protein, ur NEGATIVE NEGATIVE mg/dL   Nitrite NEGATIVE NEGATIVE   Leukocytes, UA NEGATIVE NEGATIVE    Ct Abdomen Pelvis W Contrast  Result Date: 02/13/2017 CLINICAL DATA:  Acute onset of lower abdominal pain and pubic pain. Nausea. EXAM: CT ABDOMEN AND PELVIS WITH CONTRAST TECHNIQUE: Multidetector CT imaging of the abdomen and pelvis was performed using the standard protocol following bolus administration of intravenous contrast. CONTRAST:  100mL ISOVUE-300 IOPAMIDOL (ISOVUE-300) INJECTION 61% COMPARISON:  Abdominal radiograph performed 05/01/2011 FINDINGS: Lower chest: The visualized lung bases are grossly clear. The visualized portions of the mediastinum are unremarkable. Hepatobiliary: The liver is unremarkable in appearance. The  gallbladder is unremarkable in appearance. The common bile duct remains normal in caliber. Pancreas: The pancreas is within normal limits. Spleen: The spleen is unremarkable in appearance. Adrenals/Urinary Tract: The adrenal glands are unremarkable in appearance. The kidneys are grossly unremarkable. There is no evidence of hydronephrosis. No renal or ureteral stones are identified; evaluation is somewhat suboptimal due to contrast in the renal collecting systems bilaterally. No perinephric stranding is seen. Stomach/Bowel: The stomach is unremarkable in appearance. The small bowel is within normal limits. The appendix is normal in caliber, without evidence of appendicitis. The colon is unremarkable in appearance. Vascular/Lymphatic: The abdominal aorta is unremarkable in appearance. The inferior vena cava is grossly unremarkable. No retroperitoneal lymphadenopathy is seen. No pelvic sidewall lymphadenopathy is identified. Reproductive: The bladder is mildly distended and grossly unremarkable. The prostate remains normal in size. Other: No  additional soft tissue abnormalities are seen. Musculoskeletal: No acute osseous abnormalities are identified. The visualized musculature is unremarkable in appearance. IMPRESSION: No acute abnormality seen within the abdomen or pelvis. Electronically Signed   By: Roanna Raider M.D.   On: 02/13/2017 22:46    11:50 pm. Patient re examined. Abdomen soft, normal bowel sounds. Tender with palpation lower abdomen right >left. No rebound, no guarding. No inguinal hernia palpated, no abdominal hernia palpated.  Discussed with patient results of CT and labs with patient.   I discussed this case with Dr. Preston Fleeting. Because patient's WBC is low and has been low on other visits will add HIV and RPR to his labs. The patient states he did have an HIV about a year ago while in prison but it was negative then.  I discussed with the patient return precautions and need for a PCP for continued care.  I gave him the [facility] number for follow up. Patient agrees with plan. Patient appears stable for d/c in NAD and abdomen is soft and normal CT scan. I discussed with patient that if cultures for GC, Chlamydia or blood for RPR and HIV are positive someone will call him.    Thomas Vaughn, [place] 02/14/17 1610    Dione Booze, MD 02/14/17 [phone]

## 2017-02-14 LAB — GC/CHLAMYDIA PROBE AMP (~~LOC~~) NOT AT ARMC
CHLAMYDIA, DNA PROBE: NEGATIVE
NEISSERIA GONORRHEA: NEGATIVE

## 2017-02-14 LAB — RPR: RPR Ser Ql: NONREACTIVE

## 2017-02-14 LAB — HIV ANTIBODY (ROUTINE TESTING W REFLEX): HIV SCREEN 4TH GENERATION: NONREACTIVE

## 2017-02-14 MED ORDER — NAPROXEN 500 MG PO TABS: 500.0000 mg | ORAL_TABLET | Freq: Two times a day (BID) | ORAL | 0 refills | Status: DC

## 2017-02-14 NOTE — Discharge Instructions (Signed)
Call and schedule an appointment with [facility] for follow up.

## 2017-02-18 ENCOUNTER — Encounter (HOSPITAL_COMMUNITY): Payer: Self-pay | Admitting: Emergency Medicine

## 2017-02-18 ENCOUNTER — Emergency Department (HOSPITAL_COMMUNITY): Payer: Medicaid Other

## 2017-02-18 ENCOUNTER — Emergency Department (HOSPITAL_COMMUNITY)
Admission: EM | Admit: 2017-02-18 | Discharge: 2017-02-18 | Disposition: A | Discharge status: Home / Self Care | Payer: Medicaid Other | Attending: Emergency Medicine | Admitting: Emergency Medicine

## 2017-02-18 DIAGNOSIS — J101 Influenza due to other identified influenza virus with other respiratory manifestations: Secondary | ICD-10-CM | POA: Insufficient documentation

## 2017-02-18 DIAGNOSIS — R509 Fever, unspecified: Secondary | ICD-10-CM | POA: Insufficient documentation

## 2017-02-18 DIAGNOSIS — Z7982 Long term (current) use of aspirin: Secondary | ICD-10-CM | POA: Insufficient documentation

## 2017-02-18 DIAGNOSIS — Z79899 Other long term (current) drug therapy: Secondary | ICD-10-CM | POA: Insufficient documentation

## 2017-02-18 LAB — COMPREHENSIVE METABOLIC PANEL
ALBUMIN: 4.4 g/dL (ref 3.5–5.0)
ALT: 39 U/L (ref 17–63)
ANION GAP: 13 (ref 5–15)
AST: 49 U/L — ABNORMAL HIGH (ref 15–41)
Alkaline Phosphatase: 47 U/L (ref 38–126)
BILIRUBIN TOTAL: 0.4 mg/dL (ref 0.3–1.2)
BUN: 8 mg/dL (ref 6–20)
CALCIUM: 9.4 mg/dL (ref 8.9–10.3)
CO2: 26 mmol/L (ref 22–32)
Chloride: 98 mmol/L — ABNORMAL LOW (ref 101–111)
Creatinine, Ser: 1.1 mg/dL (ref 0.61–1.24)
GFR calc non Af Amer: >60 mL/min (ref >60)
GLUCOSE: 110 mg/dL — AB (ref 65–99)
POTASSIUM: 3.5 mmol/L (ref 3.5–5.1)
SODIUM: 137 mmol/L (ref 135–145)
TOTAL PROTEIN: 8.9 g/dL — AB (ref 6.5–8.1)

## 2017-02-18 LAB — RAPID STREP SCREEN (MED CTR MEBANE ONLY): Streptococcus, Group A Screen (Direct): NEGATIVE

## 2017-02-18 LAB — CBC WITH DIFFERENTIAL/PLATELET
BASOS ABS: 0 10*3/uL (ref 0.0–0.1)
BASOS PCT: 0 %
Eosinophils Absolute: 0 10*3/uL (ref 0.0–0.7)
Eosinophils Relative: 0 %
HEMATOCRIT: 43.8 % (ref 39.0–52.0)
HEMOGLOBIN: 14.6 g/dL (ref 13.0–17.0)
LYMPHS PCT: 18 %
Lymphs Abs: 0.8 10*3/uL (ref 0.7–4.0)
MCH: 30.5 pg (ref 26.0–34.0)
MCHC: 33.3 g/dL (ref 30.0–36.0)
MCV: 91.4 fL (ref 78.0–100.0)
Monocytes Absolute: 0.3 10*3/uL (ref 0.1–1.0)
Monocytes Relative: 7 %
NEUTROS ABS: 3.1 10*3/uL (ref 1.7–7.7)
NEUTROS PCT: 75 %
Platelets: 185 10*3/uL (ref 150–400)
RBC: 4.79 MIL/uL (ref 4.22–5.81)
RDW: 13.4 % (ref 11.5–15.5)
WBC: 4.2 10*3/uL (ref 4.0–10.5)

## 2017-02-18 LAB — I-STAT CG4 LACTIC ACID, ED: Lactic Acid, Venous: 1.63 mmol/L (ref 0.5–1.9)

## 2017-02-18 LAB — INFLUENZA PANEL BY PCR (TYPE A & B)
Influenza A By PCR: POSITIVE — AB
Influenza B By PCR: NEGATIVE

## 2017-02-18 MED ORDER — OSELTAMIVIR PHOSPHATE 75 MG PO CAPS
75.0000 mg | ORAL_CAPSULE | Freq: Once | ORAL | Status: AC
  Administered 2017-02-18: 75 mg via ORAL
  Filled 2017-02-18: qty 1

## 2017-02-18 MED ORDER — ONDANSETRON HCL 4 MG/2ML IJ SOLN
4.0000 mg | Freq: Once | INTRAMUSCULAR | Status: AC
  Administered 2017-02-18: 4 mg via INTRAVENOUS
  Filled 2017-02-18: qty 2

## 2017-02-18 MED ORDER — BENZONATATE 100 MG PO CAPS: 100.0000 mg | ORAL_CAPSULE | Freq: Three times a day (TID) | ORAL | 0 refills | Status: DC

## 2017-02-18 MED ORDER — KETOROLAC TROMETHAMINE 30 MG/ML IJ SOLN
30.0000 mg | Freq: Once | INTRAMUSCULAR | Status: AC
  Administered 2017-02-18: 30 mg via INTRAVENOUS
  Filled 2017-02-18: qty 1

## 2017-02-18 MED ORDER — ONDANSETRON HCL 4 MG PO TABS: 4.0000 mg | ORAL_TABLET | Freq: Three times a day (TID) | ORAL | 0 refills | Status: DC | PRN

## 2017-02-18 MED ORDER — SODIUM CHLORIDE 0.9 % IV BOLUS (SEPSIS)
1000.0000 mL | Freq: Once | INTRAVENOUS | Status: AC
  Administered 2017-02-18: 1000 mL via INTRAVENOUS

## 2017-02-18 MED ORDER — GUAIFENESIN ER 1200 MG PO TB12: 1.0000 | ORAL_TABLET | Freq: Two times a day (BID) | ORAL | 0 refills | Status: DC | PRN

## 2017-02-18 MED ORDER — ACETAMINOPHEN 325 MG PO TABS
650.0000 mg | ORAL_TABLET | Freq: Once | ORAL | Status: AC | PRN
  Administered 2017-02-18: 650 mg via ORAL

## 2017-02-18 NOTE — Care Management (Signed)
Patient has had 7 ED visits in the past 6 months for similar complaints. No PCP listed patient has [place] Medicaid. [facility]. Instructions place on AVS

## 2017-02-18 NOTE — Discharge Instructions (Signed)
Please read and follow all provided instructions.  You were seen here today for your symptoms. Your diagnosis today is Influenza (Flu).   Take medications as prescribed. Followup with your doctor in regards to your hospital visit. If you do not have a doctor use the resource guide listed below to help he find one. You may return to the emergency department if symptoms worsen, become progressive, or become more concerning. Read below to learn more about your diagnosis & reasons to return. Use Tylenol to treat your fever. Use prescription for body aches.  Please use Tessalon as needed for cough.  Use Zofran for nausea and vomiting.  Use Mucinex for nasal congestion (you can find this over the counter).   Influenza, Adult  Influenza (flu) starts suddenly, usually with a fever. It causes chills, a dry-hacking cough, headache, body aches, and sore throat. Influenza spreads easily from one person to another. Risk of transmission is lowered by getting the flu shot each year.  HOME CARE  Only take medicines as told by your doctor.  Rest.  Drink enough fluids to keep your pee (urine) clear or pale yellow.  Wash your hands often. Do this after you blow your nose, after you go to the bathroom, and before you touch food.  GET HELP RIGHT AWAY IF (return to the ED) You have shortness of breath while resting.  You have pain or pressure in the chest or belly (abdomen).  You suddenly feel dizzy.  You feel confused.  You have a hard time breathing.  Your skin or nails turn bluish in color.  You get a bad neck pain or stiffness.  You get a bad headache, face pain, or earache.  You throw up (vomit) a lot and often.  You have a fever > 101 that persists  Additional Information:  Your vital signs today were: BP 134/89    Pulse 90    Temp 99.8 F (37.7 C) (Oral)    Resp 18    SpO2 99%  If your blood pressure (BP) was elevated above 135/85 this visit, please have this repeated by your doctor within one  month. ---------------

## 2017-02-18 NOTE — ED Provider Notes (Signed)
Patient placed in Quick Look pathway, seen and evaluated   Chief Complaint: cough, congestion  HPI:   27 y.o M presented to the ED complaining if 2 day history of productive cough, congestion, sore throat. No sick contacts. Pt is febrile with temp of 103.1. No difficulty breathing.   ROS: + for productive cough, congestion, fever, sore throat Negative for chest pain, abdominal pain, dysuria, diarrhea,  (one)  Physical Exam:   Gen: No distress  Neuro: Awake and Alert  Skin: Warm    Focused Exam: HEENT: throat erythematous, no exudate. Maxillary sinus tenderness. TMs clear b/l Lungs: CTAB HEART: RRR  Will order CBC, BMP, CXR, flu and strep. Tylenol give.  Initiation of care has begun. The patient has been counseled on the process, plan, and necessity for staying for the completion/evaluation, and the remainder of the medical screening examination    Dub MikesDowless, Christi Wirick Tripp, PA-C 02/18/17 1700    Pricilla LovelessGoldston, Scott, MD 02/18/17 2223

## 2017-02-18 NOTE — ED Provider Notes (Signed)
[facility] EMERGENCY DEPARTMENT Provider Note   CSN: [id number] Arrival date & time: 02/18/17  1629     History   Chief Complaint Chief Complaint  Patient presents with  . Cough  . Fever    HPI Thomas Vaughn is a 27 y.o. male who presents emerged department today for 2-day history of flulike symptoms.  Patient notes that 2 days ago he began having chills, body aches, congestion and sore throat.  He notes that later that evening he started developing a productive cough with clear/green sputum.  He has been running a fever with a T-max of 103.1.  Today the patient developed nausea with 3 episodes of nonbilious, nonbloody emesis.  Last episode of emesis was at 4 PM.  He denies take anything for his symptoms prior to arrival.  He denies any sick contacts.  He did not receive flu vaccine this year.  He denies any associated neck stiffness, chest pain, shortness of breath, hemoptysis, abdominal pain, diarrhea, urinary symptoms, rash or lower leg swelling.  HPI  Past Medical History:  Diagnosis Date  . ADHD (attention deficit hyperactivity disorder)   . Anxiety   . Depression     Patient Active Problem List   Diagnosis Date Noted  . ADHD (attention deficit hyperactivity disorder)   . HYPOKALEMIA 07/29/2009  . ANEMIA OF OTHER CHRONIC DISEASE 07/29/2009  . GYNECOMASTIA 07/29/2009  . CHEST PAIN UNSPECIFIED 07/29/2009    History reviewed. No pertinent surgical history.     Home Medications    Prior to Admission medications   Medication Sig Start Date End Date Taking? Authorizing Provider  aspirin EC 81 MG tablet Take 81-162 mg by mouth daily as needed (for headaches).    [provider]  Aspirin-Salicylamide-Caffeine (BC HEADACHE POWDER PO) Take 1 packet by mouth daily as needed (for headaches).    [provider]  famotidine (PEPCID) 20 MG tablet Take 1 tablet (20 mg total) by mouth 2 (two) times daily. Patient not taking: Reported on  02/13/2017 01/12/17   Linwood Dibbles, MD  naproxen (NAPROSYN) 500 MG tablet Take 1 tablet (500 mg total) by mouth 2 (two) times daily. 02/14/17   Janne Napoleon, NP  omeprazole (PRILOSEC) 20 MG capsule Take 1 capsule (20 mg total) by mouth daily. Patient not taking: Reported on 02/13/2017 01/26/17   Shaune Pollack, MD  pantoprazole (PROTONIX) 40 MG tablet Take 1 tablet (40 mg total) by mouth daily for 14 days. Patient not taking: Reported on 02/13/2017 12/30/16 02/13/17  Gwyneth Sprout, MD  sucralfate (CARAFATE) 1 g tablet Take 1 tablet (1 g total) by mouth 4 (four) times daily -  with meals and at bedtime for 10 days. Patient not taking: Reported on 02/13/2017 01/26/17 02/13/17  Shaune Pollack, MD    Family History History reviewed. No pertinent family history.  Social History Social History   Tobacco Use  . Smoking status: Never Smoker  . Smokeless tobacco: Never Used  Substance Use Topics  . Alcohol use: Yes    Alcohol/week: 4.2 oz    Types: 6 Cans of beer, 1 Shots of liquor per week    Comment: twice a week  . Drug use: Yes    Types: Marijuana, Methamphetamines     Allergies   Lorazepam   Review of Systems Review of Systems  All other systems reviewed and are negative.    Physical Exam Updated Vital Signs BP 131/88   Pulse 93   Temp 99.8 F (37.7 C) (  Oral)   Resp 18   SpO2 99%   Physical Exam  Constitutional: He appears well-developed and well-nourished.  HENT:  Head: Normocephalic and atraumatic.  Right Ear: Tympanic membrane and external ear normal.  Left Ear: Tympanic membrane and external ear normal.  Nose: Mucosal edema present. Right sinus exhibits maxillary sinus tenderness. Right sinus exhibits no frontal sinus tenderness. Left sinus exhibits maxillary sinus tenderness. Left sinus exhibits no frontal sinus tenderness.  Mouth/Throat: Uvula is midline, oropharynx is clear and moist and mucous membranes are normal. No tonsillar exudate.  The patient has normal phonation  and is in control of secretions. No stridor.  Midline uvula without edema. Soft palate rises symmetrically.  No tonsillar erythema or exudates. No PTA. Tongue protrusion is normal. No trismus. No creptius on neck palpation and patient has good dentition. No gingival erythema or fluctuance noted. Mucus membranes moist.   Eyes: Pupils are equal, round, and reactive to light. Right eye exhibits no discharge. Left eye exhibits no discharge. No scleral icterus.  Neck: Trachea normal. Neck supple. No spinous process tenderness present. No neck rigidity. Normal range of motion present.  No nuchal rigidity or meningismus  Cardiovascular: Normal rate, regular rhythm and intact distal pulses.  No murmur heard. Pulses:      Radial pulses are 2+ on the right side, and 2+ on the left side.       Dorsalis pedis pulses are 2+ on the right side, and 2+ on the left side.       Posterior tibial pulses are 2+ on the right side, and 2+ on the left side.  No lower extremity swelling or edema. Calves symmetric in size bilaterally.  Pulmonary/Chest: Effort normal and breath sounds normal. He exhibits no tenderness.  No increased work of breathing. No accessory muscle use. Patient is sitting upright, speaking in full sentences without difficulty   Abdominal: Soft. Bowel sounds are normal. He exhibits no distension. There is no tenderness. There is no rigidity, no rebound, no guarding and no CVA tenderness.  Musculoskeletal: He exhibits no edema.  Lymphadenopathy:    He has no cervical adenopathy.  Neurological: He is alert.  Speech clear. Follows commands. No facial droop. PERRLA. EOM grossly intact. CN III-XII grossly intact. Grossly moves all extremities 4 without ataxia. Able and appropriate strength for age to upper and lower extremities bilaterally.   Skin: Skin is warm and dry. No petechiae, no purpura and no rash noted. He is not diaphoretic.  Psychiatric: He has a normal mood and affect.  Nursing note and  vitals reviewed.    ED Treatments / Results  Labs (all labs ordered are listed, but only abnormal results are displayed) Labs Reviewed  COMPREHENSIVE METABOLIC PANEL - Abnormal; Notable for the following components:      Result Value   Chloride 98 (*)    Glucose, Bld 110 (*)    Total Protein 8.9 (*)    AST 49 (*)    All other components within normal limits  INFLUENZA PANEL BY PCR (TYPE A & B) - Abnormal; Notable for the following components:   Influenza A By PCR POSITIVE (*)    All other components within normal limits  RAPID STREP SCREEN (NOT AT [facility])  CULTURE, GROUP A STREP ([facility])  CBC WITH DIFFERENTIAL/PLATELET  I-STAT CG4 LACTIC ACID, ED    EKG  EKG Interpretation None       Radiology Dg Chest 2 View  Result Date: 02/18/2017 CLINICAL DATA:  Cough and fever. EXAM:  CHEST  2 VIEW COMPARISON:  January 26, 2017 FINDINGS: The heart size and mediastinal contours are within normal limits. Both lungs are clear. The visualized skeletal structures are unremarkable. IMPRESSION: No active cardiopulmonary disease. Electronically Signed   By: Gerome Sam III M.D   On: 02/18/2017 17:24    Procedures Procedures (including critical care time)  Medications Ordered in ED Medications  ondansetron [facility]) injection 4 mg (not administered)  acetaminophen (TYLENOL) tablet 650 mg (650 mg Oral Given 02/18/17 1641)  ketorolac (TORADOL) 30 MG/ML injection 30 mg (30 mg Intravenous Given 02/18/17 2207)  sodium chloride 0.9 % bolus 1,000 mL (1,000 mLs Intravenous New Bag/Given 02/18/17 2206)     Initial Impression / Assessment and Plan / ED Course  I have reviewed the triage vital signs and the nursing notes.  Pertinent labs & imaging results that were available during my care of the patient were reviewed by me and considered in my medical decision making (see chart for details).     27 year old male presenting with 2-day history of fever, chills, body aches, congestion, sore throat,  productive cough with clear/green sputum, nausea and vomiting.  Patient evaluated in quick look pathway with CBC, BMP, chest x-ray, flu and strep test ordered.  He was given Tylenol as he was noted to be febrile at 102.8.    When I initially evaluated the patient his fever persisted.  He was without tachycardia, tachypnea, hypoxia or hypotension.  On my exam the patient does have mucosal edema bilaterally with sinus tenderness.  Oropharynx & TMs are clear bilaterally.  Heart is regular rate and rhythm and lungs are clear to auscultation bilaterally.  Abdomen soft and nontender.  Labs done prior to me seeing the patient with positive influenza A.  Strep test negative.  Lactic acid within normal limits.  Electrolytes within normal limits.  Normal kidney function.  Mild hyperglycemia of 110 without anion gap acidosis.  Improving LFTs from prior visit on the third.  No leukocytosis.  No anemia.  Chest x-ray without active cardiopulmonary disease.  No cardiomegaly, infiltrates, or edema. Suspect symptoms are due to influenza.   Patient given IVF, Zofran and Toradol.  After treatment patient's fever resolved.  He is remained without tachycardia, tachypnea, hypoxia or hypotension.  He states he is feeling improved.  Patient is on the cusp of 48-hour treatment for Tamiflu.  Risks and benefits discussed.  He elected to be treated with Tamiflu.  First dose given in the department.  Patient is tolerating p.o. fluids and has been without any emesis in the department.  Repeat abdominal exam without any tenderness palpation.  Feel the patient can be discharged home due to stable vital signs and patient tolerating p.o. fluids. Will discharge the patient home on Tamiflu and symptomatic therapy. I advised the patient to follow-up with PCP this week. Specific return precautions discussed. Time was given for all questions to be answered. The patient verbalized understanding and agreement with plan. The patient appears safe for  discharge home.   Vitals:   02/18/17 2200 02/18/17 2230 02/18/17 2245 02/18/17 2300  BP: 131/88 133/85  134/89  Pulse: 93 90  90  Resp:      Temp:   99.8 F (37.7 C)   TempSrc:   Oral   SpO2: 99% 99%  99%     Final Clinical Impressions(s) / ED Diagnoses   Final diagnoses:  Influenza A    ED Discharge Orders        Ordered  benzonatate (TESSALON) 100 MG capsule  Every 8 hours     02/18/17 2340    ondansetron (ZOFRAN) 4 MG tablet  Every 8 hours PRN     02/18/17 2340    Guaifenesin (MUCINEX MAXIMUM STRENGTH) 1200 MG TB12  Every 12 hours PRN     02/18/17 2340       Princella Pellegrini 02/19/17 0051    Arby Barrette, MD 02/20/17 [phone]

## 2017-02-18 NOTE — ED Triage Notes (Signed)
Pt to ER for evaluation of cough and fever x2 days, fever 102.9 at this time, also reports n/v. A/o x4 at this time. NAD

## 2017-02-21 LAB — CULTURE, GROUP A STREP (THRC)

## 2017-03-11 ENCOUNTER — Emergency Department (HOSPITAL_COMMUNITY)
Admission: EM | Admit: 2017-03-11 | Discharge: 2017-03-12 | Disposition: A | Discharge status: Home / Self Care | Payer: Medicaid Other | Attending: Emergency Medicine | Admitting: Emergency Medicine

## 2017-03-11 ENCOUNTER — Emergency Department (HOSPITAL_COMMUNITY): Payer: Medicaid Other

## 2017-03-11 ENCOUNTER — Other Ambulatory Visit: Payer: Self-pay

## 2017-03-11 ENCOUNTER — Encounter (HOSPITAL_COMMUNITY): Payer: Self-pay

## 2017-03-11 DIAGNOSIS — Z7982 Long term (current) use of aspirin: Secondary | ICD-10-CM | POA: Insufficient documentation

## 2017-03-11 DIAGNOSIS — R1110 Vomiting, unspecified: Secondary | ICD-10-CM | POA: Insufficient documentation

## 2017-03-11 DIAGNOSIS — R059 Cough, unspecified: Secondary | ICD-10-CM

## 2017-03-11 DIAGNOSIS — K219 Gastro-esophageal reflux disease without esophagitis: Secondary | ICD-10-CM

## 2017-03-11 DIAGNOSIS — R0602 Shortness of breath: Secondary | ICD-10-CM | POA: Insufficient documentation

## 2017-03-11 DIAGNOSIS — R05 Cough: Secondary | ICD-10-CM | POA: Insufficient documentation

## 2017-03-11 DIAGNOSIS — Z79899 Other long term (current) drug therapy: Secondary | ICD-10-CM | POA: Insufficient documentation

## 2017-03-11 DIAGNOSIS — F17210 Nicotine dependence, cigarettes, uncomplicated: Secondary | ICD-10-CM | POA: Insufficient documentation

## 2017-03-11 LAB — I-STAT CHEM 8, ED
BUN: 14 mg/dL (ref 6–20)
CALCIUM ION: 1.07 mmol/L — AB (ref 1.15–1.40)
CHLORIDE: 98 mmol/L — AB (ref 101–111)
CREATININE: 0.8 mg/dL (ref 0.61–1.24)
Glucose, Bld: 82 mg/dL (ref 65–99)
HCT: 47 % (ref 39.0–52.0)
Hemoglobin: 16 g/dL (ref 13.0–17.0)
Potassium: 3.7 mmol/L (ref 3.5–5.1)
SODIUM: 138 mmol/L (ref 135–145)
TCO2: 28 mmol/L (ref 22–32)

## 2017-03-11 LAB — I-STAT TROPONIN, ED: Troponin i, poc: 0 ng/mL (ref 0.00–0.08)

## 2017-03-11 MED ORDER — OMEPRAZOLE 20 MG PO CPDR: 20.0000 mg | DELAYED_RELEASE_CAPSULE | Freq: Every day | ORAL | 0 refills | Status: DC

## 2017-03-11 MED ORDER — BENZONATATE 100 MG PO CAPS: 100.0000 mg | ORAL_CAPSULE | Freq: Three times a day (TID) | ORAL | 0 refills | Status: DC

## 2017-03-11 MED ORDER — ALBUTEROL SULFATE HFA 108 (90 BASE) MCG/ACT IN AERS: 1.0000 | INHALATION_SPRAY | Freq: Four times a day (QID) | RESPIRATORY_TRACT | 0 refills | Status: DC | PRN

## 2017-03-11 MED ORDER — ACETAMINOPHEN 325 MG PO TABS
650.0000 mg | ORAL_TABLET | Freq: Once | ORAL | Status: AC
  Administered 2017-03-11: 650 mg via ORAL
  Filled 2017-03-11: qty 2

## 2017-03-11 MED ORDER — GI COCKTAIL ~~LOC~~
30.0000 mL | Freq: Once | ORAL | Status: AC
  Administered 2017-03-11: 30 mL via ORAL
  Filled 2017-03-11: qty 30

## 2017-03-11 MED ORDER — IPRATROPIUM-ALBUTEROL 0.5-2.5 (3) MG/3ML IN SOLN
3.0000 mL | Freq: Once | RESPIRATORY_TRACT | Status: AC
  Administered 2017-03-11: 3 mL via RESPIRATORY_TRACT
  Filled 2017-03-11: qty 3

## 2017-03-11 NOTE — ED Notes (Signed)
Pt reports that he was dx with Flu a month ago. Pt is alert and oriented x 4 and pt reports that he has a cough that is productive  With thick green sputum and has cough exacerbation . Pt reports during couging spells he becomes SOB.

## 2017-03-11 NOTE — ED Triage Notes (Signed)
Patient reports a non productive cough x 1 month. Patient reports that when he starts coughing he becomes SOB. Patient states he has been using other people's inhalers.

## 2017-03-11 NOTE — Discharge Instructions (Signed)
You are given cough medication called benzonatate to help with your cough.  You are also given an inhaler to take every 4-6 hours for wheezing or shortness of breath.  You were also given omeprazole to take once a day in the morning every day for the next 7 days to help with the symptoms of acid reflux.  I have prescribed a new medication for you today. It is important that when you pick the prescription up you discuss the potential interactions of this medication with other medications you are taking, including over the counter medications, with the pharmacists.   This new medication has potential side effects. Be sure to contact your primary care provider or return to the emergency department if you are experiencing new symptoms that you are unable to tolerate after starting the medication. You need to receive medical evaluation immediately if you start to experience blistering of the skin, rash, swelling, or difficulty breathing as these signs could indicate a more serious medication side effect.   Please follow up with your primary care provider within 5-7 days for re-evaluation of your symptoms. If you do not have a primary care provider, information for a healthcare clinic has been provided for you to make arrangements for follow up care. Please return to the emergency department for any chest pain, shortness of breath, or any new or worsening symptoms.

## 2017-03-11 NOTE — ED Notes (Signed)
Called x1 for triage. No answer

## 2017-03-11 NOTE — ED Provider Notes (Signed)
[facility]-EMERGENCY DEPT Provider Note   CSN: [id number] Arrival date & time: 03/11/17  1542     History   Chief Complaint Chief Complaint  Patient presents with  . Cough  . Shortness of Breath    HPI Thomas Vaughn is a 27 y.o. male.  HPI   Patient is a 27 year old male who is presenting the ED today complaining of a one-month history of cough productive of yellow/green sputum.  He also notes that he feels short of breath, especially with coughing spells.  States he has had multiple episodes of coughing with posttussive emesis today.  Also notes that he has 6/10 central chest pain since he woke up early this morning states it feels like a pressure in his chest.  Denies that it is a radiating pain.  States it is not worse with movement.  Denies that it is worse with exertion.  Denies associated nausea or diaphoresis.  Reports that he believes this is due to acid from vomiting today.  Notes that he has had multiple days of heavy drinking over the past 48 hours, and states that he sometimes has pain that is similar after prolonged periods of drinking due to acid reflux.  Denies any abdominal pain, nausea, diarrhea, constipation.  Denies any sore throat, fevers, sinus pain/pressure, ear pain, headaches, myalgias.  He has been taking his friend's inhaler with mild relief.  Denies substance use and specifically denies cocaine use.   Past Medical History:  Diagnosis Date  . ADHD (attention deficit hyperactivity disorder)   . Anxiety   . Depression     Patient Active Problem List   Diagnosis Date Noted  . ADHD (attention deficit hyperactivity disorder)   . HYPOKALEMIA 07/29/2009  . ANEMIA OF OTHER CHRONIC DISEASE 07/29/2009  . GYNECOMASTIA 07/29/2009  . CHEST PAIN UNSPECIFIED 07/29/2009    History reviewed. No pertinent surgical history.     Home Medications    Prior to Admission medications   Medication Sig Start Date End Date Taking? Authorizing  Provider  albuterol (PROVENTIL HFA;VENTOLIN HFA) 108 (90 Base) MCG/ACT inhaler Inhale 1-2 puffs into the lungs every 6 (six) hours as needed for wheezing or shortness of breath. 03/11/17   Taylen Osorto S, PA-C  aspirin EC 81 MG tablet Take 81-162 mg by mouth daily as needed (for headaches).    [provider]  Aspirin-Salicylamide-Caffeine (BC HEADACHE POWDER PO) Take 1 packet by mouth daily as needed (for headaches).    [provider]  benzonatate (TESSALON) 100 MG capsule Take 1 capsule (100 mg total) by mouth every 8 (eight) hours. 03/11/17   Hargis Vandyne S, PA-C  famotidine (PEPCID) 20 MG tablet Take 1 tablet (20 mg total) by mouth 2 (two) times daily. Patient not taking: Reported on 02/13/2017 01/12/17   Linwood DibblesKnapp, Jon, MD  Guaifenesin [facility](MUCINEX MAXIMUM STRENGTH) 1200 MG TB12 Take 1 tablet (1,200 mg total) by mouth every 12 (twelve) hours as needed. 02/18/17   Maczis, Elmer SowMichael M, PA-C  naproxen (NAPROSYN) 500 MG tablet Take 1 tablet (500 mg total) by mouth 2 (two) times daily. 02/14/17   Janne NapoleonNeese, Hope M, NP  omeprazole (PRILOSEC) 20 MG capsule Take 1 capsule (20 mg total) by mouth daily for 7 days. 03/11/17 03/18/17  Jonique Kulig S, PA-C  ondansetron (ZOFRAN) 4 MG tablet Take 1 tablet (4 mg total) by mouth every 8 (eight) hours as needed for nausea or vomiting. 02/18/17   Maczis, Elmer SowMichael M, PA-C  pantoprazole (PROTONIX) 40 MG tablet  Take 1 tablet (40 mg total) by mouth daily for 14 days. Patient not taking: Reported on 02/13/2017 12/30/16 02/13/17  Gwyneth Sprout, MD  sucralfate (CARAFATE) 1 g tablet Take 1 tablet (1 g total) by mouth 4 (four) times daily -  with meals and at bedtime for 10 days. Patient not taking: Reported on 02/13/2017 01/26/17 02/13/17  Shaune Pollack, MD    Family History Family History  Problem Relation Age of Onset  . Heart failure Mother     Social History Social History   Tobacco Use  . Smoking status: Current Some Day Smoker    Types: Cigarettes  . Smokeless  tobacco: Never Used  Substance Use Topics  . Alcohol use: Yes    Alcohol/week: 4.2 oz    Types: 6 Cans of beer, 1 Shots of liquor per week    Comment: twice a week  . Drug use: No     Allergies   Lorazepam   Review of Systems Review of Systems  Constitutional: Negative for chills, diaphoresis and fever.  HENT: Negative for congestion, ear pain, rhinorrhea and sore throat.   Eyes: Negative for pain and visual disturbance.  Respiratory: Positive for cough and shortness of breath.   Cardiovascular: Positive for chest pain. Negative for palpitations and leg swelling.  Gastrointestinal: Positive for vomiting. Negative for abdominal pain, constipation, diarrhea and nausea.  Genitourinary: Negative for decreased urine volume, dysuria and hematuria.  Musculoskeletal: Negative for arthralgias, back pain and myalgias.  Skin: Negative for color change and rash.  Neurological: Negative for dizziness, seizures, syncope, light-headedness and headaches.  All other systems reviewed and are negative.    Physical Exam Updated Vital Signs BP (!) 183/82 (BP Location: Left Arm)   Pulse 98   Temp 98.9 F (37.2 C) (Oral)   Resp 20   Ht 5\' 6"  (1.676 m)   Wt 70.3 kg (155 lb)   SpO2 99%   BMI 25.02 kg/m   Physical Exam  Constitutional: He appears well-developed and well-nourished.  Non-toxic appearance. He does not appear ill. No distress.  HENT:  Head: Normocephalic and atraumatic.  Mouth/Throat: Oropharynx is clear and moist. No oropharyngeal exudate or posterior oropharyngeal edema.  Mild pharyngeal erythema.  No tonsillar swelling or exudates.  Uvula midline.  Normal voice.   Eyes: Conjunctivae and EOM are normal. Pupils are equal, round, and reactive to light.  Neck: Normal range of motion. Neck supple.  Cardiovascular: Normal rate, regular rhythm and normal heart sounds.  No murmur heard. Pulmonary/Chest: Effort normal and breath sounds normal. No respiratory distress. He has no  wheezes. He has no rhonchi. He has no rales.  Pt has decreased breath sounds throughout  Abdominal: Soft. Bowel sounds are normal. He exhibits no distension. There is no tenderness.  Musculoskeletal: He exhibits no edema.       Right lower leg: He exhibits no edema.       Left lower leg: He exhibits no edema.  Lymphadenopathy:    He has no cervical adenopathy.  Neurological: He is alert.  Skin: Skin is warm and dry. Capillary refill takes less than 2 seconds.  Psychiatric: He has a normal mood and affect.  Nursing note and vitals reviewed.    ED Treatments / Results  Labs (all labs ordered are listed, but only abnormal results are displayed) Labs Reviewed  I-STAT CHEM 8, ED - Abnormal; Notable for the following components:      Result Value   Chloride 98 (*)    Calcium,  Ion 1.07 (*)    All other components within normal limits  I-STAT TROPONIN, ED    EKG  EKG Interpretation None       Radiology Dg Chest 2 View  Result Date: 03/11/2017 CLINICAL DATA:  27 year old male with influenza 1 month ago. Continued nonproductive cough. Smoker. EXAM: CHEST  2 VIEW COMPARISON:  02/18/2017 and earlier. FINDINGS: Lung volumes are stable and within normal limits. Mediastinal contours remain normal. Visualized tracheal air column is within normal limits. No pneumothorax, pulmonary edema, pleural effusion or confluent pulmonary opacity. No acute osseous abnormality identified. Negative visible bowel gas pattern. IMPRESSION: Negative.  No acute cardiopulmonary abnormality. Electronically Signed   By: Odessa Fleming M.D.   On: 03/11/2017 18:18    Procedures Procedures (including critical care time)  Medications Ordered in ED Medications  gi cocktail (Maalox,Lidocaine,Donnatal) (30 mLs Oral Given 03/11/17 2021)  ipratropium-albuterol (DUONEB) 0.5-2.5 (3) MG/3ML nebulizer solution 3 mL (3 mLs Nebulization Given 03/11/17 2021)  acetaminophen (TYLENOL) tablet 650 mg (650 mg Oral Given 03/11/17 2021)      Initial Impression / Assessment and Plan / ED Course  I have reviewed the triage vital signs and the nursing notes.  Pertinent labs & imaging results that were available during my care of the patient were reviewed by me and considered in my medical decision making (see chart for details).  7:36 PM On my initial evaluation I rechecked pt's BP and it was 138/83.   9:44 PM re-evaluated pt. He states that he no longer feels short of breath after the nebulizer tx. Repeat pulmonary exam reveals increased air exchange bilat. States that his chest pain improved after GI cocktail. Does states that he feels anxious and that his heart is fast after the breathing treatment.   Final Clinical Impressions(s) / ED Diagnoses   Final diagnoses:  Cough  Gastroesophageal reflux disease, esophagitis presence not specified    27 year old male presenting with 1 month history of cough.  States that he is shortness of breath as well as chest pain that began this morning.  Initial blood pressure noted to be 183/82, however on recheck his blood pressure during my exam BP was 138/83.  Remainder of vital signs are stable.  Patient is nontoxic-appearing and is no acute distress.  Patient's pulmonary exam revealed no wheezing, however did reveal decreased lung sounds throughout.  Remainder of his exam was benign.  Chest x-ray was ordered and was negative for pneumonia.  Given patient has decreased lung sounds will order nebulizer treatment.  Suspect that patient's chest pain is due to acid reflux from vomiting and from multiple days of heavy alcohol intake.  Will give GI cocktail.  Will also order Tylenol.  Given that patient states pain is pressure like, will order troponin, i-STAT Chem-8, and ECG to rule out ACS, however doubt this.  I-stat chem 8 is benign and trop is negative. sxs started >12 hours ago. Doubt PE. Doubt AAA as no widened mediastinum on CXR. No evidence of PTX as well on CXR. ECG with NSR and no  ischemic changes. Sxs more likely related to acid reflux given resolution of sxs after GI cocktail.    Cough likely secondary to upper respiratory infection that is being exacerbated by patient's continued tobacco use.  Discussed tobacco cessation.  Will send patient home with albuterol inhaler for his symptoms as well as benzonatate for the cough.  Will also sent home with 7-day course of omeprazole to trial this for GERD.  Advised patient to  follow-up with primary care to check resolution of his symptoms within 1 week.  Advised him to return to the emergency department for any chest pain, shortness of breath, or any new or worsening symptoms.  Patient voiced understanding of these directions, and all of his questions were answered.  ED Discharge Orders        Ordered    albuterol (PROVENTIL HFA;VENTOLIN HFA) 108 (90 Base) MCG/ACT inhaler  Every 6 hours PRN     03/11/17 2213    benzonatate (TESSALON) 100 MG capsule  Every 8 hours     03/11/17 2213    omeprazole (PRILOSEC) 20 MG capsule  Daily     03/11/17 2213       Karrie Meres, PA-C 03/11/17 2229    Shaune Pollack, MD 03/12/17 0010

## 2017-03-15 ENCOUNTER — Emergency Department (HOSPITAL_COMMUNITY): Payer: Self-pay

## 2017-03-15 ENCOUNTER — Emergency Department (HOSPITAL_COMMUNITY)
Admission: EM | Admit: 2017-03-15 | Discharge: 2017-03-15 | Disposition: A | Discharge status: Home / Self Care | Payer: Self-pay | Attending: Physician Assistant | Admitting: Physician Assistant

## 2017-03-15 ENCOUNTER — Other Ambulatory Visit: Payer: Self-pay

## 2017-03-15 DIAGNOSIS — F17210 Nicotine dependence, cigarettes, uncomplicated: Secondary | ICD-10-CM | POA: Insufficient documentation

## 2017-03-15 DIAGNOSIS — B349 Viral infection, unspecified: Secondary | ICD-10-CM | POA: Insufficient documentation

## 2017-03-15 DIAGNOSIS — Z79899 Other long term (current) drug therapy: Secondary | ICD-10-CM | POA: Insufficient documentation

## 2017-03-15 LAB — CBC WITH DIFFERENTIAL/PLATELET
Basophils Absolute: 0 10*3/uL (ref 0.0–0.1)
Basophils Relative: 0 %
EOS ABS: 0.1 10*3/uL (ref 0.0–0.7)
Eosinophils Relative: 1 %
HEMATOCRIT: 42.1 % (ref 39.0–52.0)
HEMOGLOBIN: 13.9 g/dL (ref 13.0–17.0)
LYMPHS ABS: 1.7 10*3/uL (ref 0.7–4.0)
LYMPHS PCT: 28 %
MCH: 29.8 pg (ref 26.0–34.0)
MCHC: 33 g/dL (ref 30.0–36.0)
MCV: 90.1 fL (ref 78.0–100.0)
MONOS PCT: 10 %
Monocytes Absolute: 0.6 10*3/uL (ref 0.1–1.0)
NEUTROS PCT: 61 %
Neutro Abs: 3.8 10*3/uL (ref 1.7–7.7)
Platelets: 190 10*3/uL (ref 150–400)
RBC: 4.67 MIL/uL (ref 4.22–5.81)
RDW: 13 % (ref 11.5–15.5)
WBC: 6.2 10*3/uL (ref 4.0–10.5)

## 2017-03-15 LAB — COMPREHENSIVE METABOLIC PANEL
ALK PHOS: 64 U/L (ref 38–126)
ALT: 68 U/L — AB (ref 17–63)
ANION GAP: 9 (ref 5–15)
AST: 71 U/L — ABNORMAL HIGH (ref 15–41)
Albumin: 4.1 g/dL (ref 3.5–5.0)
BILIRUBIN TOTAL: 0.5 mg/dL (ref 0.3–1.2)
BUN: 7 mg/dL (ref 6–20)
CALCIUM: 9.3 mg/dL (ref 8.9–10.3)
CO2: 29 mmol/L (ref 22–32)
CREATININE: 0.78 mg/dL (ref 0.61–1.24)
Chloride: 104 mmol/L (ref 101–111)
GFR calc Af Amer: >60 mL/min (ref >60)
Glucose, Bld: 110 mg/dL — ABNORMAL HIGH (ref 65–99)
Potassium: 3.8 mmol/L (ref 3.5–5.1)
Sodium: 142 mmol/L (ref 135–145)
TOTAL PROTEIN: 8.4 g/dL — AB (ref 6.5–8.1)

## 2017-03-15 LAB — LIPASE, BLOOD: Lipase: 33 U/L (ref 11–51)

## 2017-03-15 MED ORDER — IPRATROPIUM BROMIDE 0.02 % IN SOLN
RESPIRATORY_TRACT | Status: AC
  Filled 2017-03-15: qty 2.5

## 2017-03-15 MED ORDER — ALBUTEROL SULFATE HFA 108 (90 BASE) MCG/ACT IN AERS
1.0000 | INHALATION_SPRAY | Freq: Four times a day (QID) | RESPIRATORY_TRACT | Status: DC | PRN
  Administered 2017-03-15: 2 via RESPIRATORY_TRACT
  Filled 2017-03-15: qty 6.7

## 2017-03-15 MED ORDER — ALBUTEROL SULFATE (2.5 MG/3ML) 0.083% IN NEBU
5.0000 mg | INHALATION_SOLUTION | Freq: Once | RESPIRATORY_TRACT | Status: DC
  Filled 2017-03-15: qty 6

## 2017-03-15 MED ORDER — ONDANSETRON HCL 4 MG/2ML IJ SOLN
INTRAMUSCULAR | Status: AC
  Administered 2017-03-15: 4 mg
  Filled 2017-03-15: qty 2

## 2017-03-15 NOTE — ED Notes (Signed)
Pt vomited approx after triage.

## 2017-03-15 NOTE — ED Notes (Addendum)
Per lab, orders will have to be resubmitted and redrawn.

## 2017-03-15 NOTE — ED Provider Notes (Signed)
[facility] EMERGENCY DEPARTMENT Provider Note   CSN: [id number] Arrival date & time: 03/15/17  1230     History   Chief Complaint Chief Complaint  Patient presents with  . Shortness of Breath    HPI Thomas Vaughn is a 27 y.o. male.  HPI   27 year old male presents today with numerous complaints.  Patient reports that Thomas Vaughn was drinking last night as Thomas Vaughn normally does Thomas Vaughn drinks consistently most nights.  Thomas Vaughn woke up this morning with cough, respiratory congestion, nausea vomiting and diarrhea.  Patient denies any chest pain, denies any pain with deep inspiration, denies any abdominal pain, or swelling in the lower extremities.  No history DVT or PE, no significant cardiac history.  Patient notes Thomas Vaughn was given antinausea medication prior to my assessment and has been able to tolerate p.o. since then.  Patient denies any fever.  Patient reports that Thomas Vaughn does not smoke.  Past Medical History:  Diagnosis Date  . ADHD (attention deficit hyperactivity disorder)   . Anxiety   . Depression     Patient Active Problem List   Diagnosis Date Noted  . ADHD (attention deficit hyperactivity disorder)   . HYPOKALEMIA 07/29/2009  . ANEMIA OF OTHER CHRONIC DISEASE 07/29/2009  . GYNECOMASTIA 07/29/2009  . CHEST PAIN UNSPECIFIED 07/29/2009    No past surgical history on file.     Home Medications    Prior to Admission medications   Medication Sig Start Date End Date Taking? Authorizing Provider  albuterol (PROVENTIL HFA;VENTOLIN HFA) 108 (90 Base) MCG/ACT inhaler Inhale 1-2 puffs into the lungs every 6 (six) hours as needed for wheezing or shortness of breath. 03/11/17   Couture, Cortni S, PA-C  aspirin EC 81 MG tablet Take 81-162 mg by mouth daily as needed (for headaches).    [provider]  Aspirin-Salicylamide-Caffeine (BC HEADACHE POWDER PO) Take 1 packet by mouth daily as needed (for headaches).    [provider]  benzonatate (TESSALON) 100 MG  capsule Take 1 capsule (100 mg total) by mouth every 8 (eight) hours. 03/11/17   Couture, Cortni S, PA-C  famotidine (PEPCID) 20 MG tablet Take 1 tablet (20 mg total) by mouth 2 (two) times daily. Patient not taking: Reported on 02/13/2017 01/12/17   Linwood Dibbles, MD  Guaifenesin [facility] MAXIMUM STRENGTH) 1200 MG TB12 Take 1 tablet (1,200 mg total) by mouth every 12 (twelve) hours as needed. 02/18/17   Maczis, Elmer Sow, PA-C  naproxen (NAPROSYN) 500 MG tablet Take 1 tablet (500 mg total) by mouth 2 (two) times daily. 02/14/17   Janne Napoleon, NP  omeprazole (PRILOSEC) 20 MG capsule Take 1 capsule (20 mg total) by mouth daily for 7 days. 03/11/17 03/18/17  Couture, Cortni S, PA-C  ondansetron (ZOFRAN) 4 MG tablet Take 1 tablet (4 mg total) by mouth every 8 (eight) hours as needed for nausea or vomiting. 02/18/17   Maczis, Elmer Sow, PA-C  pantoprazole (PROTONIX) 40 MG tablet Take 1 tablet (40 mg total) by mouth daily for 14 days. 12/30/16 03/15/17  Gwyneth Sprout, MD  sucralfate (CARAFATE) 1 g tablet Take 1 tablet (1 g total) by mouth 4 (four) times daily -  with meals and at bedtime for 10 days. 01/26/17 03/15/17  Shaune Pollack, MD    Family History Family History  Problem Relation Age of Onset  . Heart failure Mother     Social History Social History   Tobacco Use  . Smoking status: Current Some Day Smoker  Types: Cigarettes  . Smokeless tobacco: Never Used  Substance Use Topics  . Alcohol use: Yes    Alcohol/week: 4.2 oz    Types: 6 Cans of beer, 1 Shots of liquor per week    Comment: twice a week  . Drug use: No     Allergies   Lorazepam   Review of Systems Review of Systems  All other systems reviewed and are negative.   Physical Exam Updated Vital Signs BP (!) 139/105 (BP Location: Right Arm)   Pulse 77   Temp 98.8 F (37.1 C) (Oral)   Resp 20   Ht 5\' 6"  (1.676 m)   Wt 75.3 kg (166 lb)   SpO2 98%   BMI 26.79 kg/m   Physical Exam  Constitutional: Thomas Vaughn is oriented to  person, place, and time. Thomas Vaughn appears well-developed and well-nourished.  HENT:  Head: Normocephalic and atraumatic.  Eyes: Conjunctivae are normal. Pupils are equal, round, and reactive to light. Right eye exhibits no discharge. Left eye exhibits no discharge. No scleral icterus.  Neck: Normal range of motion. No JVD present. No tracheal deviation present.  Cardiovascular: Normal rate, normal heart sounds and intact distal pulses. Exam reveals no gallop and no friction rub.  No murmur heard. Pulmonary/Chest: Effort normal and breath sounds normal. No stridor. No respiratory distress. Thomas Vaughn has no wheezes. Thomas Vaughn has no rales. Thomas Vaughn exhibits no tenderness.  Musculoskeletal: Thomas Vaughn exhibits no edema.  Neurological: Thomas Vaughn is alert and oriented to person, place, and time. Coordination normal.  Psychiatric: Thomas Vaughn has a normal mood and affect. His behavior is normal. Judgment and thought content normal.  Nursing note and vitals reviewed.    ED Treatments / Results  Labs (all labs ordered are listed, but only abnormal results are displayed) Labs Reviewed  COMPREHENSIVE METABOLIC PANEL - Abnormal; Notable for the following components:      Result Value   Glucose, Bld 110 (*)    Total Protein 8.4 (*)    AST 71 (*)    ALT 68 (*)    All other components within normal limits  CBC WITH DIFFERENTIAL/PLATELET  LIPASE, BLOOD    EKG  EKG Interpretation None       Radiology Dg Chest 2 View  Result Date: 03/15/2017 CLINICAL DATA:  Shortness of breath, nausea, vomiting and diarrhea since this morning. EXAM: CHEST  2 VIEW COMPARISON:  03/11/2017 FINDINGS: The heart size and mediastinal contours are within normal limits. Both lungs are clear. The visualized skeletal structures are unremarkable. IMPRESSION: No active cardiopulmonary disease. Electronically Signed   By: Elberta Fortis M.D.   On: 03/15/2017 13:51    Procedures Procedures (including critical care time)  Medications Ordered in ED Medications    albuterol (PROVENTIL) (2.5 MG/3ML) 0.083% nebulizer solution 5 mg (not administered)  ipratropium (ATROVENT) 0.02 % nebulizer solution (not administered)  albuterol (PROVENTIL HFA;VENTOLIN HFA) 108 (90 Base) MCG/ACT inhaler 1-2 puff (2 puffs Inhalation Given 03/15/17 1617)  ondansetron (ZOFRAN) 4 MG/2ML injection (4 mg  Given 03/15/17 1306)     Initial Impression / Assessment and Plan / ED Course  I have reviewed the triage vital signs and the nursing notes.  Pertinent labs & imaging results that were available during my care of the patient were reviewed by me and considered in my medical decision making (see chart for details).     Final Clinical Impressions(s) / ED Diagnoses   Final diagnoses:  Viral illness   Labs: CBC, CMP, Lipase  Imaging: DG chest 2 view-  EKG reviewed without acute abnormality   Consults:  Therapeutics: Albuterol, Zofran, Atrovent  Discharge Meds:   Assessment/Plan:   27 year old male presents today with likely viral illness.  Is well-appearing in no acute distress.  Thomas Vaughn does have a cough, clear lung sounds reassuring chest x-ray with no acute abnormalities and reassuring vital signs.  Patient's labs are reassuring with no significant abnormalities.  Patient was drinking last night, but does drink consistently.  I had a lengthy discussion with him about alcohol use and abuse, offered resources and counseling on alcohol cessation, I spent approximately 10 minutes discussing this with the patient.  Patient has no signs of DVT, ACS, or any other intrapulmonary abnormality.  Patient will be discharged with albuterol inhaler at his request, Thomas Vaughn is given strict return precautions, Thomas Vaughn verbalized understanding and agreement to today's plan had no further questions or concerns at time of discharge.  ED Discharge Orders    None       Rosalio LoudHedges, Teriah Muela, PA-C 03/15/17 1907    Abelino DerrickMackuen, Courteney Lyn, MD 03/17/17 [phone]

## 2017-03-15 NOTE — ED Triage Notes (Signed)
Pt states this morning while laying bed noted he was having SOB, layed there for a minute, got up and vomited x2. Admits to drinking last night. Pt awake, alert appropriate. Lung sounds diminished. VSS.

## 2017-03-15 NOTE — ED Notes (Signed)
Pt returned from xray

## 2017-03-15 NOTE — Discharge Instructions (Signed)
Please read attached information. If you experience any new or worsening signs or symptoms please return to the emergency room for evaluation. Please follow-up with your primary care provider or specialist as discussed. Please use medication prescribed only as directed and discontinue taking if you have any concerning signs or symptoms.   °

## 2017-03-15 NOTE — ED Notes (Signed)
Called pt 3x. No answer.

## 2017-03-15 NOTE — ED Notes (Signed)
Called pt 3x to come back to room. No response.

## 2017-04-01 ENCOUNTER — Encounter (HOSPITAL_COMMUNITY): Payer: Self-pay

## 2017-04-01 ENCOUNTER — Emergency Department (HOSPITAL_COMMUNITY)
Admission: EM | Admit: 2017-04-01 | Discharge: 2017-04-01 | Disposition: A | Discharge status: Home / Self Care | Payer: Medicaid Other | Attending: Emergency Medicine | Admitting: Emergency Medicine

## 2017-04-01 DIAGNOSIS — Z7982 Long term (current) use of aspirin: Secondary | ICD-10-CM | POA: Insufficient documentation

## 2017-04-01 DIAGNOSIS — F17210 Nicotine dependence, cigarettes, uncomplicated: Secondary | ICD-10-CM | POA: Insufficient documentation

## 2017-04-01 DIAGNOSIS — K2920 Alcoholic gastritis without bleeding: Secondary | ICD-10-CM | POA: Insufficient documentation

## 2017-04-01 DIAGNOSIS — Z79899 Other long term (current) drug therapy: Secondary | ICD-10-CM | POA: Insufficient documentation

## 2017-04-01 DIAGNOSIS — R1084 Generalized abdominal pain: Secondary | ICD-10-CM

## 2017-04-01 DIAGNOSIS — F909 Attention-deficit hyperactivity disorder, unspecified type: Secondary | ICD-10-CM | POA: Insufficient documentation

## 2017-04-01 LAB — COMPREHENSIVE METABOLIC PANEL
ALT: 60 U/L (ref 17–63)
ANION GAP: 12 (ref 5–15)
AST: 48 U/L — ABNORMAL HIGH (ref 15–41)
Albumin: 3.9 g/dL (ref 3.5–5.0)
Alkaline Phosphatase: 66 U/L (ref 38–126)
BUN: 8 mg/dL (ref 6–20)
CALCIUM: 9.1 mg/dL (ref 8.9–10.3)
CO2: 22 mmol/L (ref 22–32)
Chloride: 103 mmol/L (ref 101–111)
Creatinine, Ser: 0.76 mg/dL (ref 0.61–1.24)
Glucose, Bld: 97 mg/dL (ref 65–99)
Potassium: 3.5 mmol/L (ref 3.5–5.1)
SODIUM: 137 mmol/L (ref 135–145)
TOTAL PROTEIN: 7.8 g/dL (ref 6.5–8.1)
Total Bilirubin: 0.6 mg/dL (ref 0.3–1.2)

## 2017-04-01 LAB — CBC
HCT: 39 % (ref 39.0–52.0)
HEMOGLOBIN: 12.9 g/dL — AB (ref 13.0–17.0)
MCH: 29.7 pg (ref 26.0–34.0)
MCHC: 33.1 g/dL (ref 30.0–36.0)
MCV: 89.9 fL (ref 78.0–100.0)
PLATELETS: 243 10*3/uL (ref 150–400)
RBC: 4.34 MIL/uL (ref 4.22–5.81)
RDW: 13.5 % (ref 11.5–15.5)
WBC: 3.5 10*3/uL — AB (ref 4.0–10.5)

## 2017-04-01 LAB — URINALYSIS, ROUTINE W REFLEX MICROSCOPIC
BILIRUBIN URINE: NEGATIVE
Bacteria, UA: NONE SEEN
GLUCOSE, UA: NEGATIVE mg/dL
HGB URINE DIPSTICK: NEGATIVE
KETONES UR: NEGATIVE mg/dL
LEUKOCYTES UA: NEGATIVE
NITRITE: NEGATIVE
PROTEIN: 30 mg/dL — AB
Specific Gravity, Urine: 1.027 (ref 1.005–1.030)
pH: 5 (ref 5.0–8.0)

## 2017-04-01 LAB — LIPASE, BLOOD: Lipase: 38 U/L (ref 11–51)

## 2017-04-01 MED ORDER — GI COCKTAIL ~~LOC~~
30.0000 mL | Freq: Once | ORAL | Status: AC
  Administered 2017-04-01: 30 mL via ORAL
  Filled 2017-04-01: qty 30

## 2017-04-01 MED ORDER — SUCRALFATE 1 G PO TABS: 1.0000 g | ORAL_TABLET | Freq: Three times a day (TID) | ORAL | 0 refills | Status: DC

## 2017-04-01 MED ORDER — OMEPRAZOLE 20 MG PO CPDR: 20.0000 mg | DELAYED_RELEASE_CAPSULE | Freq: Every day | ORAL | 0 refills | Status: DC

## 2017-04-01 NOTE — Discharge Instructions (Addendum)
When you get to [place] please make sure that you look into the local AA and other programs to help with alcohol abuse and use.  If your symptoms worsen, your abdominal pain returns or you have any concerns please seek additional medical evaluation. Please avoid tylenol while drinking.

## 2017-04-01 NOTE — ED Triage Notes (Signed)
Pt presents for evaluation of abd pain and emesis x 1 this AM. Pt with some nasal congestion.

## 2017-04-01 NOTE — ED Provider Notes (Signed)
[facility] Provider Note   CSN: [id number] Arrival date & time: 04/01/17  1205     History   Chief Complaint Chief Complaint  Patient presents with  . Abdominal Pain    HPI Thomas Vaughn is a 27 y.o. male with a history of alcohol abuse, adhd, anxiety, depression, who presents today for evaluation of mental abdominal pain.  He reports that for the past few weeks he has been consuming excess amounts of alcohol, and that he has been on a bender for the past week.  He reports consuming "a lot" of alcohol.  He reports that he has not been eating, primarily just drinking alcohol.  [place]  He denies any trauma.  No fevers or chills . He reports that he vomited once, however says that is his normal with his drinking.  He denies any urinary symptoms.    HPI  Past Medical History:  Diagnosis Date  . ADHD (attention deficit hyperactivity disorder)   . Anxiety   . Depression     Patient Active Problem List   Diagnosis Date Noted  . ADHD (attention deficit hyperactivity disorder)   . HYPOKALEMIA 07/29/2009  . ANEMIA OF OTHER CHRONIC DISEASE 07/29/2009  . GYNECOMASTIA 07/29/2009  . CHEST PAIN UNSPECIFIED 07/29/2009    History reviewed. No pertinent surgical history.      Home Medications    Prior to Admission medications   Medication Sig Start Date End Date Taking? Authorizing Provider  albuterol (PROVENTIL HFA;VENTOLIN HFA) 108 (90 Base) MCG/ACT inhaler Inhale 1-2 puffs into the lungs every 6 (six) hours as needed for wheezing or shortness of breath. 03/11/17   Couture, Cortni S, PA-C  aspirin EC 81 MG tablet Take 81-162 mg by mouth daily as needed (for headaches).    [provider]  Aspirin-Salicylamide-Caffeine (BC HEADACHE POWDER PO) Take 1 packet by mouth daily as needed (for headaches).    [provider]  benzonatate (TESSALON) 100 MG capsule Take 1  capsule (100 mg total) by mouth every 8 (eight) hours. 03/11/17   Couture, Cortni S, PA-C  famotidine (PEPCID) 20 MG tablet Take 1 tablet (20 mg total) by mouth 2 (two) times daily. Patient not taking: Reported on 02/13/2017 01/12/17   Linwood DibblesKnapp, Jon, MD  Guaifenesin [facility](MUCINEX MAXIMUM STRENGTH) 1200 MG TB12 Take 1 tablet (1,200 mg total) by mouth every 12 (twelve) hours as needed. 02/18/17   Maczis, Elmer SowMichael M, PA-C  naproxen (NAPROSYN) 500 MG tablet Take 1 tablet (500 mg total) by mouth 2 (two) times daily. 02/14/17   Janne NapoleonNeese, Hope M, NP  omeprazole (PRILOSEC) 20 MG capsule Take 1 capsule (20 mg total) by mouth daily for 7 days. 04/01/17 04/08/17  Cristina GongHammond, Young Mulvey W, PA-C  ondansetron (ZOFRAN) 4 MG tablet Take 1 tablet (4 mg total) by mouth every 8 (eight) hours as needed for nausea or vomiting. 02/18/17   Maczis, Elmer SowMichael M, PA-C  pantoprazole (PROTONIX) 40 MG tablet Take 1 tablet (40 mg total) by mouth daily for 14 days. 12/30/16 03/15/17  Gwyneth SproutPlunkett, Whitney, MD  sucralfate (CARAFATE) 1 g tablet Take 1 tablet (1 g total) by mouth 4 (four) times daily -  with meals and at bedtime for 10 days. 04/01/17 04/11/17  Cristina GongHammond, Kaedance Magos W, PA-C    Family History Family History  Problem Relation Age of Onset  . Heart failure Mother     Social History Social History   Tobacco  Use  . Smoking status: Current Some Day Smoker    Types: Cigarettes  . Smokeless tobacco: Never Used  Substance Use Topics  . Alcohol use: Yes    Alcohol/week: 4.2 oz    Types: 6 Cans of beer, 1 Shots of liquor per week    Comment: twice a week  . Drug use: No     Allergies   Lorazepam   Review of Systems Review of Systems  Constitutional: Negative for chills and fever.  HENT: Negative for ear pain and sore throat.   Eyes: Negative for pain and visual disturbance.  Respiratory: Negative for cough and shortness of breath.   Cardiovascular: Negative for chest pain and palpitations.  Gastrointestinal: Positive for abdominal pain and  vomiting. Negative for blood in stool and diarrhea.  Genitourinary: Negative for dysuria and hematuria.  Musculoskeletal: Negative for arthralgias and back pain.  Skin: Negative for color change and rash.  Neurological: Negative for seizures, syncope and headaches.  All other systems reviewed and are negative.    Physical Exam Updated Vital Signs BP 112/74 (BP Location: Right Arm)   Pulse 90   Temp 98.7 F (37.1 C) (Oral)   Resp (!) 22   Ht 5\' 6"  (1.676 m)   Wt 74.8 kg (165 lb)   SpO2 99%   BMI 26.63 kg/m   Physical Exam  Constitutional: He appears well-developed and well-nourished.  Non-toxic appearance. No distress.  HENT:  Head: Normocephalic and atraumatic.  Mouth/Throat: Oropharynx is clear and moist.  Eyes: Conjunctivae are normal.  Neck: Neck supple.  Cardiovascular: Normal rate, regular rhythm and normal heart sounds.  No murmur heard. Pulmonary/Chest: Effort normal and breath sounds normal. No respiratory distress. He has no wheezes. He has no rhonchi. He has no rales.  Abdominal: Soft. Normal appearance and bowel sounds are normal. There is no tenderness. There is no rigidity, no rebound and no guarding.  Musculoskeletal: He exhibits no edema.  Neurological: He is alert.  Skin: Skin is warm and dry.  Psychiatric: He has a normal mood and affect.  Nursing note and vitals reviewed.    ED Treatments / Results  Labs (all labs ordered are listed, but only abnormal results are displayed) Labs Reviewed  COMPREHENSIVE METABOLIC PANEL - Abnormal; Notable for the following components:      Result Value   AST 48 (*)    All other components within normal limits  CBC - Abnormal; Notable for the following components:   WBC 3.5 (*)    Hemoglobin 12.9 (*)    All other components within normal limits  URINALYSIS, ROUTINE W REFLEX MICROSCOPIC - Abnormal; Notable for the following components:   Protein, ur 30 (*)    Squamous Epithelial / LPF 0-5 (*)    All other  components within normal limits  LIPASE, BLOOD    EKG None  Radiology No results found.  Procedures Procedures (including critical care time)  Medications Ordered in ED Medications  gi cocktail (Maalox,Lidocaine,Donnatal) (30 mLs Oral Given 04/01/17 1649)     Initial Impression / Assessment and Plan / ED Course  I have reviewed the triage vital signs and the nursing notes.  Pertinent labs & imaging results that were available during my care of the patient were reviewed by me and considered in my medical decision making (see chart for details).  Clinical Course as of Apr 01 1745  Fri Apr 01, 2017  1736 Patient reevaluated, symptoms and cooperative multiple cold medical discharge over 5-minute the need  to quit drinking alcohol along with the potential complications and withdrawal signs and symptoms.  He stated understanding.   [EH]    Clinical Course User Index [EH] Cristina Gong, PA-C   Patient presents today for evaluation of abdominal pain.  This is his 10th ED visit in the past 6 months with 0 admissions.  Has a long-standing history of alcohol abuse drinking.  He reports that he has recently been on a "bender."  His abdomen was soft, nontender nondistended.  He p.o. challenge successfully.  He was given a GI cocktail with relief of his symptoms.  Given that his abdomen is soft, nontender nondistended, labs appear consistent with his baseline CT scan not indicated at this time.  He did have a recent CT scan on 02/13/17 when he was having similar symptoms that was unremarkable.  I suspect that his symptoms are related to alcohol induced gastritis.  Return precautions were discussed and he stated his understanding.  He has not had additional vomiting.  He was given rx for carafate and prilosec.    Final Clinical Impressions(s) / ED Diagnoses   Final diagnoses:  Alcoholic gastritis, presence of bleeding unspecified, unspecified chronicity  Generalized abdominal pain    ED  Discharge Orders        Ordered    sucralfate (CARAFATE) 1 g tablet  3 times daily with meals & bedtime     04/01/17 1739    omeprazole (PRILOSEC) 20 MG capsule  Daily     04/01/17 1739       Cristina Gong, [place] 04/01/17 1747    Charlynne Pander, MD 04/01/17 2249

## 2017-04-01 NOTE — Discharge Planning (Signed)
EDCM notes 9 ED visits in last 6 months.  Will provide indigent clinic information on AVS.

## 2017-04-04 ENCOUNTER — Emergency Department (HOSPITAL_COMMUNITY): Payer: Self-pay

## 2017-04-04 ENCOUNTER — Emergency Department (HOSPITAL_COMMUNITY)
Admission: EM | Admit: 2017-04-04 | Discharge: 2017-04-04 | Disposition: A | Discharge status: Home / Self Care | Payer: Self-pay | Attending: Emergency Medicine | Admitting: Emergency Medicine

## 2017-04-04 ENCOUNTER — Other Ambulatory Visit: Payer: Self-pay

## 2017-04-04 ENCOUNTER — Encounter (HOSPITAL_COMMUNITY): Payer: Self-pay | Admitting: Emergency Medicine

## 2017-04-04 DIAGNOSIS — F1010 Alcohol abuse, uncomplicated: Secondary | ICD-10-CM | POA: Insufficient documentation

## 2017-04-04 DIAGNOSIS — Z888 Allergy status to other drugs, medicaments and biological substances status: Secondary | ICD-10-CM | POA: Insufficient documentation

## 2017-04-04 DIAGNOSIS — Z79899 Other long term (current) drug therapy: Secondary | ICD-10-CM | POA: Insufficient documentation

## 2017-04-04 DIAGNOSIS — F17210 Nicotine dependence, cigarettes, uncomplicated: Secondary | ICD-10-CM | POA: Insufficient documentation

## 2017-04-04 LAB — CBC
HCT: 42.2 % (ref 39.0–52.0)
HEMOGLOBIN: 13.7 g/dL (ref 13.0–17.0)
MCH: 29.7 pg (ref 26.0–34.0)
MCHC: 32.5 g/dL (ref 30.0–36.0)
MCV: 91.3 fL (ref 78.0–100.0)
PLATELETS: 259 10*3/uL (ref 150–400)
RBC: 4.62 MIL/uL (ref 4.22–5.81)
RDW: 14 % (ref 11.5–15.5)
WBC: 8.7 10*3/uL (ref 4.0–10.5)

## 2017-04-04 LAB — COMPREHENSIVE METABOLIC PANEL
ALK PHOS: 57 U/L (ref 38–126)
ALT: 75 U/L — ABNORMAL HIGH (ref 17–63)
ANION GAP: 20 — AB (ref 5–15)
AST: 88 U/L — ABNORMAL HIGH (ref 15–41)
Albumin: 4.2 g/dL (ref 3.5–5.0)
BILIRUBIN TOTAL: 0.9 mg/dL (ref 0.3–1.2)
BUN: 11 mg/dL (ref 6–20)
CALCIUM: 9 mg/dL (ref 8.9–10.3)
CO2: 18 mmol/L — ABNORMAL LOW (ref 22–32)
CREATININE: 0.76 mg/dL (ref 0.61–1.24)
Chloride: 101 mmol/L (ref 101–111)
Glucose, Bld: 67 mg/dL (ref 65–99)
Potassium: 3.4 mmol/L — ABNORMAL LOW (ref 3.5–5.1)
Sodium: 139 mmol/L (ref 135–145)
TOTAL PROTEIN: 8.4 g/dL — AB (ref 6.5–8.1)

## 2017-04-04 LAB — LIPASE, BLOOD: Lipase: 24 U/L (ref 11–51)

## 2017-04-04 MED ORDER — ONDANSETRON 4 MG PO TBDP
4.0000 mg | ORAL_TABLET | Freq: Once | ORAL | Status: AC
  Administered 2017-04-04: 4 mg via ORAL
  Filled 2017-04-04: qty 1

## 2017-04-04 MED ORDER — GI COCKTAIL ~~LOC~~
30.0000 mL | Freq: Once | ORAL | Status: AC
Start: 2017-04-04 — End: 2017-04-04
  Administered 2017-04-04: 30 mL via ORAL
  Filled 2017-04-04: qty 30

## 2017-04-04 MED ORDER — ONDANSETRON HCL 4 MG PO TABS: 4.0000 mg | ORAL_TABLET | Freq: Four times a day (QID) | ORAL | 0 refills | Status: DC

## 2017-04-04 NOTE — Discharge Instructions (Signed)
Please read attached information. If you experience any new or worsening signs or symptoms please return to the emergency room for evaluation. Please follow-up with your primary care provider or specialist as discussed. Please use medication prescribed only as directed and discontinue taking if you have any concerning signs or symptoms.   °

## 2017-04-04 NOTE — ED Triage Notes (Signed)
Pt to ER - reports woke up this morning with chest palpitations and feeling short of breath. Also reports emesis x1 this morning. Pt in NAD.

## 2017-04-04 NOTE — ED Provider Notes (Signed)
[facility] EMERGENCY DEPARTMENT Provider Note   CSN: [id number] Arrival date & time: 04/04/17  1049     History   Chief Complaint Chief Complaint  Patient presents with  . Shortness of Breath    HPI Thomas Vaughn is a 27 y.o. male.  HPI   27 year old male presents today with nausea vomiting.  Patient reports he is a alcoholic was drinking last night.  Woke up this morning vomiting.  He notes some associated chest pain shortness with with this..  No significant shortness of breath presently.  No history of ACS PE, or any significant risk factors for the same.  Denies any abdominal pain or fever.  Past Medical History:  Diagnosis Date  . ADHD (attention deficit hyperactivity disorder)   . Anxiety   . Depression     Patient Active Problem List   Diagnosis Date Noted  . ADHD (attention deficit hyperactivity disorder)   . HYPOKALEMIA 07/29/2009  . ANEMIA OF OTHER CHRONIC DISEASE 07/29/2009  . GYNECOMASTIA 07/29/2009  . CHEST PAIN UNSPECIFIED 07/29/2009    History reviewed. No pertinent surgical history.      Home Medications    Prior to Admission medications   Medication Sig Start Date End Date Taking? Authorizing Provider  albuterol (PROVENTIL HFA;VENTOLIN HFA) 108 (90 Base) MCG/ACT inhaler Inhale 1-2 puffs into the lungs every 6 (six) hours as needed for wheezing or shortness of breath. 03/11/17   Couture, Cortni S, PA-C  aspirin EC 81 MG tablet Take 81-162 mg by mouth daily as needed (for headaches).    [provider]  Aspirin-Salicylamide-Caffeine (BC HEADACHE POWDER PO) Take 1 packet by mouth daily as needed (for headaches).    [provider]  benzonatate (TESSALON) 100 MG capsule Take 1 capsule (100 mg total) by mouth every 8 (eight) hours. 03/11/17   Couture, Cortni S, PA-C  famotidine (PEPCID) 20 MG tablet Take 1 tablet (20 mg total) by mouth 2 (two) times daily. Patient not taking: Reported on 02/13/2017 01/12/17   Linwood Dibbles, MD  Guaifenesin [facility] MAXIMUM STRENGTH) 1200 MG TB12 Take 1 tablet (1,200 mg total) by mouth every 12 (twelve) hours as needed. 02/18/17   Maczis, Elmer Sow, PA-C  naproxen (NAPROSYN) 500 MG tablet Take 1 tablet (500 mg total) by mouth 2 (two) times daily. 02/14/17   Janne Napoleon, NP  omeprazole (PRILOSEC) 20 MG capsule Take 1 capsule (20 mg total) by mouth daily for 7 days. 04/01/17 04/08/17  Cristina Gong, PA-C  ondansetron (ZOFRAN) 4 MG tablet Take 1 tablet (4 mg total) by mouth every 8 (eight) hours as needed for nausea or vomiting. 02/18/17   Maczis, Elmer Sow, PA-C  pantoprazole (PROTONIX) 40 MG tablet Take 1 tablet (40 mg total) by mouth daily for 14 days. 12/30/16 03/15/17  Gwyneth Sprout, MD  sucralfate (CARAFATE) 1 g tablet Take 1 tablet (1 g total) by mouth 4 (four) times daily -  with meals and at bedtime for 10 days. 04/01/17 04/11/17  Cristina Gong, PA-C    Family History Family History  Problem Relation Age of Onset  . Heart failure Mother     Social History Social History   Tobacco Use  . Smoking status: Current Some Day Smoker    Types: Cigarettes  . Smokeless tobacco: Never Used  Substance Use Topics  . Alcohol use: Yes    Alcohol/week: 4.2 oz    Types: 6 Cans of beer, 1 Shots of liquor per week  Comment: twice a week  . Drug use: No     Allergies   Lorazepam   Review of Systems Review of Systems  All other systems reviewed and are negative.    Physical Exam Updated Vital Signs There were no vitals taken for this visit.  Physical Exam  Constitutional: He is oriented to person, place, and time. He appears well-developed and well-nourished.  HENT:  Head: Normocephalic and atraumatic.  Eyes: Pupils are equal, round, and reactive to light. Conjunctivae are normal. Right eye exhibits no discharge. Left eye exhibits no discharge. No scleral icterus.  Neck: Normal range of motion. No JVD present. No tracheal deviation present.    Cardiovascular: Normal rate, regular rhythm, normal heart sounds and intact distal pulses. Exam reveals no gallop and no friction rub.  No murmur heard. Pulmonary/Chest: Effort normal and breath sounds normal. No stridor. No respiratory distress. He has no wheezes. He has no rales. He exhibits no tenderness.  Abdominal: Soft. He exhibits no distension and no mass. There is no tenderness. There is no rebound and no guarding.  Neurological: He is alert and oriented to person, place, and time. Coordination normal.  Psychiatric: He has a normal mood and affect. His behavior is normal. Judgment and thought content normal.  Nursing note and vitals reviewed.    ED Treatments / Results  Labs (all labs ordered are listed, but only abnormal results are displayed) Labs Reviewed  COMPREHENSIVE METABOLIC PANEL - Abnormal; Notable for the following components:      Result Value   Potassium 3.4 (*)    CO2 18 (*)    Total Protein 8.4 (*)    AST 88 (*)    ALT 75 (*)    Anion gap 20 (*)    All other components within normal limits  LIPASE, BLOOD  CBC    EKG None  Radiology Dg Chest 2 View  Result Date: 04/04/2017 CLINICAL DATA:  Shortness of breath and central chest pain. EXAM: CHEST - 2 VIEW COMPARISON:  03/15/2017 FINDINGS: The heart size and mediastinal contours are within normal limits. Both lungs are clear. No pleural effusion or pneumothorax. The visualized skeletal structures are unremarkable. IMPRESSION: Normal chest radiographs. Electronically Signed   By: Amie Portlandavid  Ormond M.D.   On: 04/04/2017 13:34    Procedures Procedures (including critical care time)  Medications Ordered in ED Medications  ondansetron (ZOFRAN-ODT) disintegrating tablet 4 mg (has no administration in time range)  gi cocktail (Maalox,Lidocaine,Donnatal) (has no administration in time range)     Initial Impression / Assessment and Plan / ED Course  I have reviewed the triage vital signs and the nursing  notes.  Pertinent labs & imaging results that were available during my care of the patient were reviewed by me and considered in my medical decision making (see chart for details).      Final Clinical Impressions(s) / ED Diagnoses   Final diagnoses:  Alcohol abuse   Labs: Lipase, CMP, CBC  Imaging: DG chest 2 view -ED EKG no acute abnormalities, normal sinus rhythm  Consults:  Therapeutics:  Discharge Meds:   Assessment/Plan:   27 year old male presents today with nausea and vomiting secondary to alcohol most likely.  Patient with chest pain with vomiting, nonsevere, low suspicion for tear likely irritation.  Patient given GI cocktail antinausea medication tolerating p.o. no signs of distress.  She does have elevation in his LFTs a sensibly counseled him on alcohol use and abuse.  This presentation is similar to previous episodes.  Discharged with outpatient follow-up and return precautions.  ED Discharge Orders    None       Rosalio Loud 04/04/17 1527    Benjiman Core, MD 04/05/17 2215

## 2017-04-14 ENCOUNTER — Encounter (HOSPITAL_COMMUNITY): Payer: Self-pay | Admitting: Emergency Medicine

## 2017-04-14 DIAGNOSIS — F329 Major depressive disorder, single episode, unspecified: Secondary | ICD-10-CM | POA: Insufficient documentation

## 2017-04-14 DIAGNOSIS — Z79899 Other long term (current) drug therapy: Secondary | ICD-10-CM | POA: Insufficient documentation

## 2017-04-14 DIAGNOSIS — Z046 Encounter for general psychiatric examination, requested by authority: Secondary | ICD-10-CM | POA: Insufficient documentation

## 2017-04-14 DIAGNOSIS — R45851 Suicidal ideations: Secondary | ICD-10-CM | POA: Insufficient documentation

## 2017-04-14 DIAGNOSIS — F10920 Alcohol use, unspecified with intoxication, uncomplicated: Secondary | ICD-10-CM | POA: Insufficient documentation

## 2017-04-14 DIAGNOSIS — F17210 Nicotine dependence, cigarettes, uncomplicated: Secondary | ICD-10-CM | POA: Insufficient documentation

## 2017-04-14 LAB — CBC
HCT: 40.4 % (ref 39.0–52.0)
HEMOGLOBIN: 13.2 g/dL (ref 13.0–17.0)
MCH: 29.3 pg (ref 26.0–34.0)
MCHC: 32.7 g/dL (ref 30.0–36.0)
MCV: 89.8 fL (ref 78.0–100.0)
Platelets: 276 10*3/uL (ref 150–400)
RBC: 4.5 MIL/uL (ref 4.22–5.81)
RDW: 13.1 % (ref 11.5–15.5)
WBC: 6.2 10*3/uL (ref 4.0–10.5)

## 2017-04-14 LAB — COMPREHENSIVE METABOLIC PANEL
ALT: 70 U/L — AB (ref 17–63)
AST: 50 U/L — AB (ref 15–41)
Albumin: 4.3 g/dL (ref 3.5–5.0)
Alkaline Phosphatase: 73 U/L (ref 38–126)
Anion gap: 13 (ref 5–15)
BUN: 12 mg/dL (ref 6–20)
CHLORIDE: 106 mmol/L (ref 101–111)
CO2: 25 mmol/L (ref 22–32)
CREATININE: 0.95 mg/dL (ref 0.61–1.24)
Calcium: 9.3 mg/dL (ref 8.9–10.3)
GFR calc Af Amer: >60 mL/min (ref >60)
GFR calc non Af Amer: >60 mL/min (ref >60)
GLUCOSE: 128 mg/dL — AB (ref 65–99)
Potassium: 3.3 mmol/L — ABNORMAL LOW (ref 3.5–5.1)
Sodium: 144 mmol/L (ref 135–145)
Total Bilirubin: 0.2 mg/dL — ABNORMAL LOW (ref 0.3–1.2)
Total Protein: 8 g/dL (ref 6.5–8.1)

## 2017-04-14 LAB — ACETAMINOPHEN LEVEL: Acetaminophen (Tylenol), Serum: <10 ug/mL — ABNORMAL LOW (ref 10–30)

## 2017-04-14 LAB — SALICYLATE LEVEL: Salicylate Lvl: <7 mg/dL (ref 2.8–30.0)

## 2017-04-14 LAB — ETHANOL: Alcohol, Ethyl (B): 343 mg/dL (ref <10)
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
## 2017-04-14 NOTE — ED Notes (Signed)
Staffing made aware of need for sitter 

## 2017-04-15 ENCOUNTER — Emergency Department (HOSPITAL_COMMUNITY)
Admission: EM | Admit: 2017-04-15 | Discharge: 2017-04-15 | Disposition: A | Discharge status: Other Acute Inpatient Hospital | Payer: Medicaid Other | Attending: Emergency Medicine | Admitting: Emergency Medicine

## 2017-04-15 ENCOUNTER — Other Ambulatory Visit: Payer: Self-pay

## 2017-04-15 ENCOUNTER — Inpatient Hospital Stay (HOSPITAL_COMMUNITY)
Admission: AD | Admit: 2017-04-15 | Discharge: 2017-04-19 | DRG: 885 | Disposition: A | Discharge status: Home / Self Care | Payer: No Typology Code available for payment source | Source: Intra-hospital | Attending: Psychiatry | Admitting: Psychiatry

## 2017-04-15 ENCOUNTER — Encounter (HOSPITAL_COMMUNITY): Payer: Self-pay | Admitting: *Deleted

## 2017-04-15 DIAGNOSIS — R45851 Suicidal ideations: Secondary | ICD-10-CM

## 2017-04-15 DIAGNOSIS — Z888 Allergy status to other drugs, medicaments and biological substances status: Secondary | ICD-10-CM | POA: Diagnosis not present

## 2017-04-15 DIAGNOSIS — G4700 Insomnia, unspecified: Secondary | ICD-10-CM | POA: Diagnosis not present

## 2017-04-15 DIAGNOSIS — K219 Gastro-esophageal reflux disease without esophagitis: Secondary | ICD-10-CM | POA: Diagnosis present

## 2017-04-15 DIAGNOSIS — J45909 Unspecified asthma, uncomplicated: Secondary | ICD-10-CM | POA: Diagnosis present

## 2017-04-15 DIAGNOSIS — F17210 Nicotine dependence, cigarettes, uncomplicated: Secondary | ICD-10-CM | POA: Diagnosis present

## 2017-04-15 DIAGNOSIS — F419 Anxiety disorder, unspecified: Secondary | ICD-10-CM | POA: Diagnosis present

## 2017-04-15 DIAGNOSIS — Z818 Family history of other mental and behavioral disorders: Secondary | ICD-10-CM | POA: Diagnosis not present

## 2017-04-15 DIAGNOSIS — F909 Attention-deficit hyperactivity disorder, unspecified type: Secondary | ICD-10-CM | POA: Diagnosis present

## 2017-04-15 DIAGNOSIS — F1010 Alcohol abuse, uncomplicated: Secondary | ICD-10-CM | POA: Diagnosis present

## 2017-04-15 DIAGNOSIS — Z6379 Other stressful life events affecting family and household: Secondary | ICD-10-CM | POA: Diagnosis not present

## 2017-04-15 DIAGNOSIS — F3164 Bipolar disorder, current episode mixed, severe, with psychotic features: Secondary | ICD-10-CM | POA: Diagnosis not present

## 2017-04-15 DIAGNOSIS — F319 Bipolar disorder, unspecified: Principal | ICD-10-CM | POA: Diagnosis present

## 2017-04-15 DIAGNOSIS — Z79899 Other long term (current) drug therapy: Secondary | ICD-10-CM | POA: Diagnosis not present

## 2017-04-15 DIAGNOSIS — F10920 Alcohol use, unspecified with intoxication, uncomplicated: Secondary | ICD-10-CM

## 2017-04-15 HISTORY — DX: Unspecified asthma, uncomplicated: J45.909

## 2017-04-15 LAB — RAPID URINE DRUG SCREEN, HOSP PERFORMED
AMPHETAMINES: NOT DETECTED
Barbiturates: NOT DETECTED
Benzodiazepines: NOT DETECTED
COCAINE: NOT DETECTED
Opiates: NOT DETECTED
TETRAHYDROCANNABINOL: POSITIVE — AB

## 2017-04-15 LAB — ETHANOL: Alcohol, Ethyl (B): 12 mg/dL — ABNORMAL HIGH (ref <10)

## 2017-04-15 MED ORDER — TRAZODONE HCL 50 MG PO TABS
50.0000 mg | ORAL_TABLET | Freq: Every evening | ORAL | Status: DC | PRN
  Administered 2017-04-15: 50 mg via ORAL
  Filled 2017-04-15 (×6): qty 1

## 2017-04-15 MED ORDER — FAMOTIDINE 20 MG PO TABS
20.0000 mg | ORAL_TABLET | Freq: Two times a day (BID) | ORAL | Status: DC
  Administered 2017-04-15 – 2017-04-19 (×8): 20 mg via ORAL
  Filled 2017-04-15: qty 1
  Filled 2017-04-15: qty 32
  Filled 2017-04-15 (×6): qty 1
  Filled 2017-04-15: qty 32
  Filled 2017-04-15 (×4): qty 1

## 2017-04-15 MED ORDER — LORAZEPAM 2 MG/ML IJ SOLN: 0.0000 mg | Freq: Four times a day (QID) | INTRAMUSCULAR | Status: DC

## 2017-04-15 MED ORDER — VITAMIN B-1 100 MG PO TABS
100.0000 mg | ORAL_TABLET | Freq: Every day | ORAL | Status: DC
  Administered 2017-04-15: 100 mg via ORAL
  Filled 2017-04-15: qty 1

## 2017-04-15 MED ORDER — MAGNESIUM HYDROXIDE 400 MG/5ML PO SUSP: 30.0000 mL | Freq: Every day | ORAL | Status: DC | PRN

## 2017-04-15 MED ORDER — ONDANSETRON 4 MG PO TBDP: 4.0000 mg | ORAL_TABLET | Freq: Four times a day (QID) | ORAL | Status: AC | PRN

## 2017-04-15 MED ORDER — IBUPROFEN 400 MG PO TABS: 600.0000 mg | ORAL_TABLET | Freq: Three times a day (TID) | ORAL | Status: DC | PRN

## 2017-04-15 MED ORDER — NICOTINE 21 MG/24HR TD PT24
21.0000 mg | MEDICATED_PATCH | Freq: Every day | TRANSDERMAL | Status: DC
  Administered 2017-04-15: 21 mg via TRANSDERMAL
  Filled 2017-04-15: qty 1

## 2017-04-15 MED ORDER — LORAZEPAM 1 MG PO TABS: 0.0000 mg | ORAL_TABLET | Freq: Four times a day (QID) | ORAL | Status: DC

## 2017-04-15 MED ORDER — HYDROXYZINE HCL 25 MG PO TABS
25.0000 mg | ORAL_TABLET | Freq: Four times a day (QID) | ORAL | Status: AC | PRN
  Administered 2017-04-16 – 2017-04-18 (×2): 25 mg via ORAL
  Filled 2017-04-15: qty 1

## 2017-04-15 MED ORDER — LORAZEPAM 1 MG PO TABS: 0.0000 mg | ORAL_TABLET | Freq: Two times a day (BID) | ORAL | Status: DC

## 2017-04-15 MED ORDER — POTASSIUM CHLORIDE CRYS ER 20 MEQ PO TBCR: 40.0000 meq | EXTENDED_RELEASE_TABLET | Freq: Once | ORAL | Status: DC

## 2017-04-15 MED ORDER — THIAMINE HCL 100 MG/ML IJ SOLN: 100.0000 mg | Freq: Every day | INTRAMUSCULAR | Status: DC

## 2017-04-15 MED ORDER — VITAMIN B-1 100 MG PO TABS
100.0000 mg | ORAL_TABLET | Freq: Every day | ORAL | Status: DC
  Administered 2017-04-16 – 2017-04-19 (×4): 100 mg via ORAL
  Filled 2017-04-15 (×6): qty 1

## 2017-04-15 MED ORDER — LOPERAMIDE HCL 2 MG PO CAPS: 2.0000 mg | ORAL_CAPSULE | ORAL | Status: AC | PRN

## 2017-04-15 MED ORDER — ADULT MULTIVITAMIN W/MINERALS CH
1.0000 | ORAL_TABLET | Freq: Every day | ORAL | Status: DC
  Administered 2017-04-16 – 2017-04-19 (×4): 1 via ORAL
  Filled 2017-04-15 (×6): qty 1

## 2017-04-15 MED ORDER — PANTOPRAZOLE SODIUM 40 MG PO TBEC
40.0000 mg | DELAYED_RELEASE_TABLET | Freq: Every day | ORAL | Status: DC
  Administered 2017-04-16 – 2017-04-19 (×4): 40 mg via ORAL
  Filled 2017-04-15: qty 1
  Filled 2017-04-15: qty 16
  Filled 2017-04-15 (×4): qty 1

## 2017-04-15 MED ORDER — LORAZEPAM 2 MG/ML IJ SOLN: 0.0000 mg | Freq: Two times a day (BID) | INTRAMUSCULAR | Status: DC

## 2017-04-15 MED ORDER — ACETAMINOPHEN 325 MG PO TABS: 650.0000 mg | ORAL_TABLET | Freq: Four times a day (QID) | ORAL | Status: DC | PRN

## 2017-04-15 MED ORDER — ALUM & MAG HYDROXIDE-SIMETH 200-200-20 MG/5ML PO SUSP: 30.0000 mL | ORAL | Status: DC | PRN

## 2017-04-15 NOTE — Tx Team (Signed)
Initial Treatment Plan 04/15/2017 3:06 PM Thomas Vaughn JXB:[id number]    PATIENT STRESSORS: Financial difficulties Marital or family conflict Substance abuse   PATIENT STRENGTHS: Ability for insight Active sense of humor Average or above average intelligence Capable of independent living Wellsite geologistCommunication skills General fund of knowledge Motivation for treatment/growth   PATIENT IDENTIFIED PROBLEMS: Depression Substance Abuse Suicidal thoughts "Help with my depression and anxiety"                     DISCHARGE CRITERIA:  Ability to meet basic life and health needs Improved stabilization in mood, thinking, and/or behavior Reduction of life-threatening or endangering symptoms to within safe limits  PRELIMINARY DISCHARGE PLAN: Attend aftercare/continuing care group Return to previous living arrangement  PATIENT/FAMILY INVOLVEMENT: This treatment plan has been presented to and reviewed with the patient, [name], and/or family member, .  The patient and family have been given the opportunity to ask questions and make suggestions.  [name], [name], [place] 04/15/2017, 3:06 PM

## 2017-04-15 NOTE — Progress Notes (Signed)
Thomas Vaughn is a 27 year old male pt admitted on voluntary basis. On admission, he reports passive SI but is able to contract for safety while in the hospital. He spoke about how [place] but was denied by Engineer, drillingprobation officer and was not given the reason. He also spoke about financial concerns and the fact that he has a lot of children and a lot of mouths to feed. He reports that he has never been hospitalized and reports that he is not on any medications at this time. He reports that when he gets discharged he is able to go back and live with his mother. He was oriented to the unit and safety maintained.

## 2017-04-15 NOTE — ED Notes (Signed)
[facility] here to transport patient. 

## 2017-04-15 NOTE — ED Notes (Addendum)
[facility] and he can come after 0900.

## 2017-04-15 NOTE — ED Notes (Signed)
[facility]

## 2017-04-15 NOTE — ED Notes (Signed)
This RN searched department for patient's shoes however unable to locate. When patient asked if he is sure he was wearing shoes or what they looked like he stated "I don't fully remember." Charge RN updated

## 2017-04-15 NOTE — BH Assessment (Signed)
[facility] Assessment Progress Note   [facility] services of Dr. Jama Flavorsobos.  Patient can come after 09:00.  Adult unit [id number] for report.  Clinician left a message with Thomas Vaughn for Thomas LollKenneth Leaphart, PA and informed Thomas SettleBrooke, RN about disposition.

## 2017-04-15 NOTE — Progress Notes (Signed)
The patient attended the evening A.A.meeting and was appropriate.  

## 2017-04-15 NOTE — ED Notes (Signed)
Unable to locate patient's shoes, Thomas Vaughn, charge RN aware

## 2017-04-15 NOTE — ED Notes (Signed)
Voluntary Admission and Consent for Treatment form signed by patient and witnessed by this RN. Consent to Release Information form signed by patient and witnessed by this RN. [facility] and [facility] made and placed in medical records. Originals in folder for [facility]. ED Consent to Transfer form signed electronically by patient and witnessed by this RN. Pt given paper with wife and mother's phone numbers and is calling at this time to notify his family [facility]

## 2017-04-15 NOTE — ED Notes (Signed)
Breakfast tray ordered 
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
## 2017-04-15 NOTE — ED Provider Notes (Signed)
[facility] EMERGENCY DEPARTMENT Provider Note   CSN: [id number] Arrival date & time: 04/14/17  2220     History   Chief Complaint Chief Complaint  Patient presents with  . Suicidal    HPI Thomas Vaughn is a 27 y.o. male.  HPI 27 year old African-American male past medical history significant for anxiety, depression, ADHD presents to the ED for suicidal ideations.  Patient states that "I was going to drink myself to death".  Patient states that he drank a "shift time "of alcohol this evening in order to hurt himself.  Patient did report some nausea which has since resolved.  Patient reports chronic alcohol use but cannot quantify how much alcohol he drank this evening.  He denies any other drug use.  Patient does report daily tobacco use.  Denies any medical complaints at this time.  States that he has seen a psychiatrist in the past.  States he has been dealing with suicidal ideations for "many years".  Does not currently taking medications for his conditions.  Patient denies any homicidal ideations.  Denies any auditory visual hallucinations.  On my examination patient states that "I can give a urine sample now".  Pt denies any fever, chill, ha, vision changes, lightheadedness, dizziness, congestion, neck pain, cp, sob, cough, abd pain, n/v/d, urinary symptoms, change in bowel habits, melena, hematochezia, lower extremity paresthesias.  Past Medical History:  Diagnosis Date  . ADHD (attention deficit hyperactivity disorder)   . Anxiety   . Depression     Patient Active Problem List   Diagnosis Date Noted  . ADHD (attention deficit hyperactivity disorder)   . HYPOKALEMIA 07/29/2009  . ANEMIA OF OTHER CHRONIC DISEASE 07/29/2009  . GYNECOMASTIA 07/29/2009  . CHEST PAIN UNSPECIFIED 07/29/2009    History reviewed. No pertinent surgical history.      Home Medications    Prior to Admission medications   Medication Sig Start Date End Date Taking?  Authorizing Provider  albuterol (PROVENTIL HFA;VENTOLIN HFA) 108 (90 Base) MCG/ACT inhaler Inhale 1-2 puffs into the lungs every 6 (six) hours as needed for wheezing or shortness of breath. 03/11/17   Couture, Cortni S, PA-C  aspirin EC 81 MG tablet Take 81-162 mg by mouth daily as needed (for headaches).    [provider]  Aspirin-Salicylamide-Caffeine (BC HEADACHE POWDER PO) Take 1 packet by mouth daily as needed (for headaches).    [provider]  benzonatate (TESSALON) 100 MG capsule Take 1 capsule (100 mg total) by mouth every 8 (eight) hours. 03/11/17   Couture, Cortni S, PA-C  famotidine (PEPCID) 20 MG tablet Take 1 tablet (20 mg total) by mouth 2 (two) times daily. Patient not taking: Reported on 02/13/2017 01/12/17   Linwood Dibbles, MD  Guaifenesin [facility] MAXIMUM STRENGTH) 1200 MG TB12 Take 1 tablet (1,200 mg total) by mouth every 12 (twelve) hours as needed. 02/18/17   Maczis, Elmer Sow, PA-C  naproxen (NAPROSYN) 500 MG tablet Take 1 tablet (500 mg total) by mouth 2 (two) times daily. 02/14/17   Janne Napoleon, NP  omeprazole (PRILOSEC) 20 MG capsule Take 1 capsule (20 mg total) by mouth daily for 7 days. 04/01/17 04/08/17  Cristina Gong, PA-C  ondansetron (ZOFRAN) 4 MG tablet Take 1 tablet (4 mg total) by mouth every 6 (six) hours. 04/04/17   Hedges, Tinnie Gens, PA-C  pantoprazole (PROTONIX) 40 MG tablet Take 1 tablet (40 mg total) by mouth daily for 14 days. 12/30/16 03/15/17  Gwyneth Sprout, MD  sucralfate (CARAFATE)  1 g tablet Take 1 tablet (1 g total) by mouth 4 (four) times daily -  with meals and at bedtime for 10 days. 04/01/17 04/11/17  Cristina GongHammond, Elizabeth W, PA-C    Family History Family History  Problem Relation Age of Onset  . Heart failure Mother     Social History Social History   Tobacco Use  . Smoking status: Current Some Day Smoker    Types: Cigarettes  . Smokeless tobacco: Never Used  Substance Use Topics  . Alcohol use: Yes    Alcohol/week: 4.2 oz     Types: 6 Cans of beer, 1 Shots of liquor per week    Comment: twice a week  . Drug use: No     Allergies   Lorazepam   Review of Systems Review of Systems  All other systems reviewed and are negative.    Physical Exam Updated Vital Signs BP 106/70 (BP Location: Left Arm)   Pulse 89   Temp 97.9 F (36.6 C) (Oral)   Resp 18   SpO2 99%   Physical Exam  Constitutional: He is oriented to person, place, and time. He appears well-developed and well-nourished. No distress.  Sleeping on my examination appears to be in no acute distress.  HENT:  Head: Normocephalic and atraumatic.  Eyes: Right eye exhibits no discharge. Left eye exhibits no discharge. No scleral icterus.  Neck: Normal range of motion.  Cardiovascular: Normal rate, regular rhythm, normal heart sounds and intact distal pulses. Exam reveals no gallop and no friction rub.  No murmur heard. Pulmonary/Chest: Effort normal and breath sounds normal. No stridor. No respiratory distress. He has no wheezes. He has no rales. He exhibits no tenderness.  Abdominal: Soft. Bowel sounds are normal.  Musculoskeletal: Normal range of motion.  Neurological: He is alert and oriented to person, place, and time.  Follows commands appropriately.  Skin: Skin is warm and dry. Capillary refill takes less than 2 seconds. No pallor.  Psychiatric: His behavior is normal. Judgment and thought content normal.  Nursing note and vitals reviewed.    ED Treatments / Results  Labs (all labs ordered are listed, but only abnormal results are displayed) Labs Reviewed  COMPREHENSIVE METABOLIC PANEL - Abnormal; Notable for the following components:      Result Value   Potassium 3.3 (*)    Glucose, Bld 128 (*)    AST 50 (*)    ALT 70 (*)    Total Bilirubin 0.2 (*)    All other components within normal limits  ETHANOL - Abnormal; Notable for the following components:   Alcohol, Ethyl (B) 343 (*)    All other components within normal limits    ACETAMINOPHEN LEVEL - Abnormal; Notable for the following components:   Acetaminophen (Tylenol), Serum <10 (*)    All other components within normal limits  SALICYLATE LEVEL  CBC  RAPID URINE DRUG SCREEN, HOSP PERFORMED    EKG None  Radiology No results found.  Procedures Procedures (including critical care time)  Medications Ordered in ED Medications - No data to display   Initial Impression / Assessment and Plan / ED Course  I have reviewed the triage vital signs and the nursing notes.  Pertinent labs & imaging results that were available during my care of the patient were reviewed by me and considered in my medical decision making (see chart for details).     Patient presents to the emergency department today for suicidal ideations and attempt to kill himself by drinking  too much alcohol.  Patient reports dealing with suicidal ideations along with anxiety and depression for many years.  Not currently seeing a therapist or taking medications for his symptoms.  Patient is currently intoxicated and smells of alcohol.  Any medical complaints at this time.  Signs are reassuring.  On my examination patient is not tachycardic.  No hypotension or fever is noted.  Medical screening lab work is reassuring.  No leukocytosis.  Electrolytes are reassuring with mild hypokalemia of 3.3 which was replaced with oral potassium.  Mild elevation in patient's AST and ALT.  Ethanol level is 343.  Urine drug screen is pending at this time.  Normal also acetaminophen and salicylate level.  Giving reassuring vital signs, physical exam and lab work feel that patient can be medically cleared for TTS evaluation and disposition.  Patient is able to ambulate with normal gait.  Tolerating p.o. fluids.  Patient appears clinically sober.  Final Clinical Impressions(s) / ED Diagnoses   Final diagnoses:  Suicidal ideation  Alcoholic intoxication without complication [facility])    ED Discharge Orders    None        Rise Mu, PA-C 04/15/17 0509    Ward, Layla Maw, DO 04/15/17 [phone]

## 2017-04-15 NOTE — Progress Notes (Signed)
Patient ID: Thomas Vaughn, male   DOB: [birth year], 28 y.o.   MRN: [id number]  Pt currently presents with a flat affect and anxious behavior. Interaction with patient is pleasant, interacts positively with peers. Pt reports allergy to Ativan that causes an increase in his blood pressure and makes him "feel like he is going to have a heart attack." CIWA score reveals a 4, reports Presents with signs and symptoms of withdrawal including fatigue, anxiety and upset stomach. Reports ongoing depression to [facility] tonight. Pt reports to writer that their goal is to "stop drinking when I leave." Has concerns about family history of medical problems. Concerns about liver function. Pt reports poor sleep PTA.    Pt provided with medications per providers orders. Pt's labs and vitals were monitored throughout the night. Pt given a 1:1 about emotional and mental status. Pt supported and encouraged to express concerns and questions. Pt educated on medications, alcohol abuse with physiological effects and anxiety. Provider notified of patients allergy and request for sleep aid, see MAR.  Pt's safety ensured with 15 minute and environmental checks. Pt currently denies SI/HI and A/V hallucinations. Pt verbally agrees to seek staff if SI/HI or A/VH occurs and to consult with staff before acting on any harmful thoughts. Will continue POC.

## 2017-04-15 NOTE — ED Notes (Signed)
This RN found patient belongings in nursing station by Pod A. Pt belongings not yet inventoried. This RN inventoried patient's belongings and valuables sealed in envelope and signed by patient. Upon going over belongings, pt states "I'm missing a pair of shoes I think. I think they were Tims." This RN to search for shoes.

## 2017-04-15 NOTE — Progress Notes (Signed)
Disposition CSW attempted to contact pt's nurse in Pod A to check on transfer status. Message left asking for a call back. Timmothy EulerJean T. Kaylyn LimSutter, MSW, LCSWA Disposition Clinical Social Work [phone] (cell) [phone] (office)

## 2017-04-16 DIAGNOSIS — F419 Anxiety disorder, unspecified: Secondary | ICD-10-CM

## 2017-04-16 DIAGNOSIS — F1010 Alcohol abuse, uncomplicated: Secondary | ICD-10-CM | POA: Diagnosis present

## 2017-04-16 DIAGNOSIS — F17210 Nicotine dependence, cigarettes, uncomplicated: Secondary | ICD-10-CM

## 2017-04-16 DIAGNOSIS — Z6379 Other stressful life events affecting family and household: Secondary | ICD-10-CM

## 2017-04-16 DIAGNOSIS — R45851 Suicidal ideations: Secondary | ICD-10-CM

## 2017-04-16 DIAGNOSIS — F4010 Social phobia, unspecified: Secondary | ICD-10-CM

## 2017-04-16 DIAGNOSIS — F410 Panic disorder [episodic paroxysmal anxiety] without agoraphobia: Secondary | ICD-10-CM

## 2017-04-16 DIAGNOSIS — R450 Nervousness: Secondary | ICD-10-CM

## 2017-04-16 DIAGNOSIS — Z818 Family history of other mental and behavioral disorders: Secondary | ICD-10-CM

## 2017-04-16 LAB — COMPREHENSIVE METABOLIC PANEL
ALK PHOS: 52 U/L (ref 38–126)
ALT: 56 U/L (ref 17–63)
ANION GAP: 10 (ref 5–15)
AST: 37 U/L (ref 15–41)
Albumin: 3.9 g/dL (ref 3.5–5.0)
BUN: 12 mg/dL (ref 6–20)
CALCIUM: 9.7 mg/dL (ref 8.9–10.3)
CO2: 30 mmol/L (ref 22–32)
Chloride: 102 mmol/L (ref 101–111)
Creatinine, Ser: 0.88 mg/dL (ref 0.61–1.24)
GFR calc non Af Amer: >60 mL/min (ref >60)
Glucose, Bld: 92 mg/dL (ref 65–99)
Potassium: 3.5 mmol/L (ref 3.5–5.1)
SODIUM: 142 mmol/L (ref 135–145)
Total Bilirubin: 0.8 mg/dL (ref 0.3–1.2)
Total Protein: 8 g/dL (ref 6.5–8.1)

## 2017-04-16 MED ORDER — TRAZODONE HCL 50 MG PO TABS
50.0000 mg | ORAL_TABLET | Freq: Every evening | ORAL | Status: DC | PRN
  Administered 2017-04-16: 50 mg via ORAL

## 2017-04-16 MED ORDER — SERTRALINE HCL 25 MG PO TABS
25.0000 mg | ORAL_TABLET | Freq: Every day | ORAL | Status: DC
  Administered 2017-04-17 – 2017-04-18 (×2): 25 mg via ORAL
  Filled 2017-04-16 (×5): qty 1

## 2017-04-16 MED ORDER — GABAPENTIN 100 MG PO CAPS
100.0000 mg | ORAL_CAPSULE | Freq: Three times a day (TID) | ORAL | Status: DC
  Administered 2017-04-16: 100 mg via ORAL
  Filled 2017-04-16 (×6): qty 1

## 2017-04-16 MED ORDER — OLANZAPINE 5 MG PO TABS
5.0000 mg | ORAL_TABLET | Freq: Every day | ORAL | Status: DC
  Filled 2017-04-16 (×2): qty 1

## 2017-04-16 MED ORDER — GABAPENTIN 100 MG PO CAPS
200.0000 mg | ORAL_CAPSULE | Freq: Three times a day (TID) | ORAL | Status: DC
  Administered 2017-04-16 – 2017-04-17 (×4): 200 mg via ORAL
  Filled 2017-04-16 (×12): qty 2

## 2017-04-16 MED ORDER — OLANZAPINE 2.5 MG PO TABS
2.5000 mg | ORAL_TABLET | Freq: Every day | ORAL | Status: DC
  Administered 2017-04-16: 2.5 mg via ORAL
  Filled 2017-04-16 (×3): qty 1
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
## 2017-04-16 NOTE — Progress Notes (Signed)
Patient did attend the evening speaker AA meeting.  

## 2017-04-16 NOTE — Plan of Care (Signed)
  Problem: Safety: Goal: Ability to disclose and discuss suicidal ideas will improve Outcome: Progressing Note:  Pt endorses passive SI tonight without a plan.  He verbally contracts for safety.

## 2017-04-16 NOTE — Progress Notes (Signed)
D: Pt was in the dayroom upon initial approach.  Pt presents with appropriate affect and depressed mood.  He describes his day as "alright" and reports goal tonight is to "be settled, chill out, go to the meeting, talk with a couple people."  Pt also reports he would like to "go to rehab for 2 weeks" [facility].  Pt denies HI, denies pain.  He endorses SI "a little bit" with no plan and he verbally contracts for safety.  Reports AH of "breathing."  Reports he has been having vivid dreams.  Pt has been visible in milieu interacting with peers and staff appropriately.  Pt attended evening group.    A: Introduced self to pt.  Actively listened to pt and offered support and encouragement. Medication administered per order.  PRN medication administered for sleep.  Q15 minute safety checks maintained.  R: Pt is safe on the unit.  Pt is compliant with medications.  Pt verbally contracts for safety.  Will continue to monitor and assess.

## 2017-04-16 NOTE — Progress Notes (Signed)
D: Patient has been visible in the milieu; he is interacting well with staff and others.  He states, "I don't feel like I have an alcohol abuse problem because I'm not having any withdrawal symptoms."  He states that he is binge drinker and does not drink every day.  He states, "when I do drink, I feel like I can't stop until I pass out."  Patient is open to reading the AA Big Book which nursing staff tried to obtain a copy.  He denies any thoughts of self harm.  Patient is pleasant, smiling at times.  He rates his depression as a 6; hopelessness as a 5 and anxiety as an 8.  His goal is "to get better sleep and get rid of depression."    A: Continue to monitor medication management and MD orders.  Safety checks completed every 15 minutes per protocol.  Offer support and encouragement as needed.  R: Patient is receptive to staff; his behavior is appropriate.
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
## 2017-04-17 DIAGNOSIS — F3164 Bipolar disorder, current episode mixed, severe, with psychotic features: Secondary | ICD-10-CM

## 2017-04-17 DIAGNOSIS — G4700 Insomnia, unspecified: Secondary | ICD-10-CM

## 2017-04-17 MED ORDER — TRAZODONE HCL 100 MG PO TABS
100.0000 mg | ORAL_TABLET | Freq: Every evening | ORAL | Status: DC | PRN
  Administered 2017-04-17 – 2017-04-18 (×2): 100 mg via ORAL
  Filled 2017-04-17: qty 16
  Filled 2017-04-17 (×2): qty 1

## 2017-04-17 MED ORDER — OLANZAPINE 5 MG PO TABS
5.0000 mg | ORAL_TABLET | Freq: Every day | ORAL | Status: DC
  Administered 2017-04-17 – 2017-04-18 (×2): 5 mg via ORAL
  Filled 2017-04-17 (×3): qty 1
  Filled 2017-04-17: qty 16

## 2017-04-17 NOTE — Plan of Care (Signed)
Patient verbalizes understanding of information, education provided. 

## 2017-04-17 NOTE — BHH Group Notes (Signed)
[facility] Group Notes:  (Nursing/MHT/Case Management/Adjunct)  Date:  04/17/2017  Time:  5:36 PM Type of Therapy:  Psychoeducational Skills  Participation Level:  Active  Participation Quality:  Appropriate  Affect:  Appropriate  Cognitive:  Appropriate  Insight:  Appropriate  Engagement in Group:  Engaged  Modes of Intervention:  Problem-solving Summary of Progress/Problems: Pt shared with each about their story and how they battle with addiction.    Keary Hanak O Zayvon Alicea 04/17/2017, 5:36 PM  

## 2017-04-17 NOTE — [facility] Group Notes (Signed)
[facility] LCSW Group Therapy Note  Date/Time: 04/17/17, 0900  Type of Therapy/Topic:  Group Therapy:  Feelings about Diagnosis  Participation Level:  Active   Mood:pleasant   Description of Group:    This group will allow patients to explore their thoughts and feelings about diagnoses they have received. Patients will be guided to explore their level of understanding and acceptance of these diagnoses. Facilitator will encourage patients to process their thoughts and feelings about the reactions of others to their diagnosis, and will guide patients in identifying ways to discuss their diagnosis with significant others in their lives. This group will be process-oriented, with patients participating in exploration of their own experiences as well as giving and receiving support and challenge from other group members.   Therapeutic Goals: 1. Patient will demonstrate understanding of diagnosis as evidence by identifying two or more symptoms of the disorder:  2. Patient will be able to express two feelings regarding the diagnosis 3. Patient will demonstrate ability to communicate their needs through discussion and/or role plays  Summary of Patient Progress:Pt had good participation in group talking about symptoms of several diagnoses, stigma, and appropriate response to diagnosis.  Pt identified anxious and overwhelmed as feelings he is currently having regarding his diagnoses.          Therapeutic Modalities:   Cognitive Behavioral Therapy Brief Therapy Feelings Identification   Daleen SquibbGreg Adolfo Granieri, LCSW

## 2017-04-17 NOTE — Progress Notes (Signed)
D.  Pt pleasant on approach, no complaints voiced.  Pt had reported poor sleep the previous night so new medication had been added for tonight.  Pt was pleased about this.  Pt was positive for evening AA group, observed in dayroom later engaged in appropriate interaction.  Pt denies SI/HI/AVH at this time.  A.  Support and encouragement offered, medication given as ordered  R.  Pt remains safe on the unit, will continue to monitor.

## 2017-04-17 NOTE — Progress Notes (Addendum)
[facility] MD Progress Note  04/17/2017 9:25 AM Thomas Vaughn  MRN:  [id number]   Subjective:  Patient reports that he did not sleep well last night. He reports tossing and turning all night. He has states he would sleep a little bit then sit up for a while and then sleep a little bit more. He reports continued racing thoughts and difficulty concentrating. He denies any SI/HI/AVH currently. He also contracts for safety.He also reports that he wants to go to rehab after discharge.  Objective: Patient's chart and findings reviewed and discussed with treatment team. Patient presents in his bed. He has a flat affect, but is pleasant. He appears depressed and doesn't sit up to talk. Due to continued complaint of poor sleep and racing thoughts, will increase the Zyprexa to 5 mg QHS.   Principal Problem: Bipolar disorder, unspecified ([place]) Diagnosis:   Patient Active Problem List   Diagnosis Date Noted  . Alcohol abuse [F10.10] 04/16/2017  . Bipolar disorder, unspecified ([place]) [F31.9] 04/15/2017  . ADHD (attention deficit hyperactivity disorder) [F90.9]   . HYPOKALEMIA [E87.6] 07/29/2009  . ANEMIA OF OTHER CHRONIC DISEASE [D63.8] 07/29/2009  . GYNECOMASTIA [N62] 07/29/2009  . CHEST PAIN UNSPECIFIED [R07.9] 07/29/2009   Total Time spent with patient: 30 minutes  Past Psychiatric History: See H&P  Past Medical History:  Past Medical History:  Diagnosis Date  . ADHD (attention deficit hyperactivity disorder)   . Anxiety   . Asthma   . Depression    History reviewed. No pertinent surgical history. Family History:  Family History  Problem Relation Age of Onset  . Heart failure Mother    Family Psychiatric  History: See H&P Social History:  Social History   Substance and Sexual Activity  Alcohol Use Yes  . Alcohol/week: 4.2 oz  . Types: 6 Cans of beer, 1 Shots of liquor per week   Comment: twice a week     Social History   Substance and Sexual Activity  Drug Use No    Social  History   Socioeconomic History  . Marital status: Married    Spouse name: Not on file  . Number of children: Not on file  . Years of education: Not on file  . Highest education level: Not on file  Occupational History  . Not on file  Social Needs  . Financial resource strain: Not on file  . Food insecurity:    Worry: Not on file    Inability: Not on file  . Transportation needs:    Medical: Not on file    Non-medical: Not on file  Tobacco Use  . Smoking status: Current Some Day Smoker    Types: Cigarettes  . Smokeless tobacco: Never Used  Substance and Sexual Activity  . Alcohol use: Yes    Alcohol/week: 4.2 oz    Types: 6 Cans of beer, 1 Shots of liquor per week    Comment: twice a week  . Drug use: No  . Sexual activity: Yes    Birth control/protection: None  Lifestyle  . Physical activity:    Days per week: Not on file    Minutes per session: Not on file  . Stress: Not on file  Relationships  . Social connections:    Talks on phone: Not on file    Gets together: Not on file    Attends religious service: Not on file    Active member of club or organization: Not on file    Attends meetings of clubs  or organizations: Not on file    Relationship status: Not on file  Other Topics Concern  . Not on file  Social History Narrative  . Not on file   Additional Social History:                         Sleep: Poor  Appetite:  Good  Current Medications: Current Facility-Administered Medications  Medication Dose Route Frequency Provider Last Rate Last Dose  . acetaminophen (TYLENOL) tablet 650 mg  650 mg Oral Q6H PRN Lindon Romp A, NP      . alum & mag hydroxide-simeth (MAALOX/MYLANTA) 200-200-20 MG/5ML suspension 30 mL  30 mL Oral Q4H PRN Lindon Romp A, NP      . famotidine (PEPCID) tablet 20 mg  20 mg Oral BID Lindon Romp A, NP   20 mg at 04/17/17 0800  . gabapentin (NEURONTIN) capsule 200 mg  200 mg Oral TID Cobos, Myer Peer, MD   200 mg at 04/17/17  0802  . hydrOXYzine (ATARAX/VISTARIL) tablet 25 mg  25 mg Oral Q6H PRN Lindon Romp A, NP   25 mg at 04/16/17 1142  . loperamide (IMODIUM) capsule 2-4 mg  2-4 mg Oral PRN Lindon Romp A, NP      . magnesium hydroxide (MILK OF MAGNESIA) suspension 30 mL  30 mL Oral Daily PRN Lindon Romp A, NP      . multivitamin with minerals tablet 1 tablet  1 tablet Oral Daily Lindon Romp A, NP   1 tablet at 04/17/17 0800  . OLANZapine (ZYPREXA) tablet 5 mg  5 mg Oral QHS Money, Travis B, FNP      . ondansetron (ZOFRAN-ODT) disintegrating tablet 4 mg  4 mg Oral Q6H PRN Lindon Romp A, NP      . pantoprazole (PROTONIX) EC tablet 40 mg  40 mg Oral Daily Lindon Romp A, NP   40 mg at 04/17/17 0800  . sertraline (ZOLOFT) tablet 25 mg  25 mg Oral Daily Cobos, Myer Peer, MD   25 mg at 04/17/17 0802  . thiamine (VITAMIN B-1) tablet 100 mg  100 mg Oral Daily Lindon Romp A, NP   100 mg at 04/17/17 0800  . traZODone (DESYREL) tablet 50 mg  50 mg Oral QHS PRN Cobos, Myer Peer, MD   50 mg at 04/16/17 2110    Lab Results:  Results for orders placed or performed during the hospital encounter of 04/15/17 (from the past 48 hour(s))  Comprehensive metabolic panel     Status: None   Collection Time: 04/16/17  6:29 AM  Result Value Ref Range   Sodium 142 135 - 145 mmol/L   Potassium 3.5 3.5 - 5.1 mmol/L   Chloride 102 101 - 111 mmol/L   CO2 30 22 - 32 mmol/L   Glucose, Bld 92 65 - 99 mg/dL   BUN 12 6 - 20 mg/dL   Creatinine, Ser 0.88 0.61 - 1.24 mg/dL   Calcium 9.7 8.9 - 10.3 mg/dL   Total Protein 8.0 6.5 - 8.1 g/dL   Albumin 3.9 3.5 - 5.0 g/dL   AST 37 15 - 41 U/L   ALT 56 17 - 63 U/L   Alkaline Phosphatase 52 38 - 126 U/L   Total Bilirubin 0.8 0.3 - 1.2 mg/dL   GFR calc non Af Amer >60 >60 mL/min   GFR calc Af Amer >60 >60 mL/min    Comment: (NOTE) The eGFR has been calculated using the CKD  EPI equation. This calculation has not been validated in all clinical situations. eGFR's persistently <60 mL/min  signify possible Chronic Kidney Disease.    Anion gap 10 5 - 15    Comment: Performed at [facility], [place] [address] [place], [place] [address]    Blood Alcohol level:  Lab Results  Component Value Date   ETH 12 (H) 04/15/2017   ETH 343 (HH) 06/00/4599    Metabolic Disorder Labs: No results found for: HGBA1C, MPG No results found for: PROLACTIN No results found for: CHOL, TRIG, HDL, CHOLHDL, VLDL, LDLCALC  Physical Findings: AIMS: Facial and Oral Movements Muscles of Facial Expression: None, normal Lips and Perioral Area: None, normal Jaw: None, normal Tongue: None, normal,Extremity Movements Upper (arms, wrists, hands, fingers): None, normal Lower (legs, knees, ankles, toes): None, normal, Trunk Movements Neck, shoulders, hips: None, normal, Overall Severity Severity of abnormal movements (highest score from questions above): None, normal Incapacitation due to abnormal movements: None, normal Patient's awareness of abnormal movements (rate only patient's report): No Awareness, Dental Status Current problems with teeth and/or dentures?: No Does patient usually wear dentures?: No  CIWA:    COWS:     Musculoskeletal: Strength & Muscle Tone: within normal limits Gait & Station: normal Patient leans: N/A  Psychiatric Specialty Exam: Physical Exam  Nursing note and vitals reviewed. Constitutional: He is oriented to person, place, and time. He appears well-developed and well-nourished.  Cardiovascular: Normal rate.  Respiratory: Effort normal.  Musculoskeletal: Normal range of motion.  Neurological: He is alert and oriented to person, place, and time.  Skin: Skin is warm.    Review of Systems  Constitutional: Negative.   HENT: Negative.   Eyes: Negative.   Respiratory: Negative.   Cardiovascular: Negative.   Gastrointestinal: Negative.   Genitourinary: Negative.   Musculoskeletal: Negative.   Skin: Negative.   Neurological: Negative.    Endo/Heme/Allergies: Negative.   Psychiatric/Behavioral: Positive for depression and substance abuse. Negative for hallucinations and suicidal ideas. The patient has insomnia.     Blood pressure 126/86, pulse 74, temperature 98 F (36.7 C), temperature source Oral, resp. rate 18, height 5' 8" (1.727 m), weight 76.7 kg (169 lb).Body mass index is 25.7 kg/m.  General Appearance: Casual  Eye Contact:  Good  Speech:  Clear and Coherent and Normal Rate  Volume:  Decreased  Mood:  Depressed  Affect:  Flat  Thought Process:  Goal Directed and Descriptions of Associations: Intact  Orientation:  Full (Time, Place, and Person)  Thought Content:  WDL  Suicidal Thoughts:  No  Homicidal Thoughts:  No  Memory:  Immediate;   Good Recent;   Good Remote;   Good  Judgement:  Fair  Insight:  Fair  Psychomotor Activity:  Normal  Concentration:  Concentration: Good and Attention Span: Good  Recall:  Good  Fund of Knowledge:  Good  Language:  Good  Akathisia:  No  Handed:  Right  AIMS (if indicated):     Assets:  Communication Skills Desire for Improvement Financial Resources/Insurance Housing Physical Health Social Support  ADL's:  Intact  Cognition:  WNL  Sleep:  Number of Hours: 6.25   Problems Addressed: Bipolar Disorder unspecified Alcohol abuse  Treatment Plan Summary: Daily contact with patient to assess and evaluate symptoms and progress in treatment, Medication management and Plan is to:  -Increase Zyprexa 5 mg PO QHS for mood stability -Continue Zoloft 25 mg PO Daily for mood stability -Continue Neurontin 200 mg PO TID for withdrawal symptoms  and agitation -Increase Trazodone 100 mg PO QHS PRN for insomnia -Continue Vistaril 25 mg Q6H PRN for anxiety -Encourage group therapy participation  Lewis Shock, FNP 04/17/2017, 9:25 AM  Agree with NP Progress Note

## 2017-04-17 NOTE — Progress Notes (Signed)
D: Patient observed initially resting in bed however is now sitting in dayroom. Patient sleepy and states "I'm so behind on my sleep. I don't think it's the meds you've given me and they are really helping. I don't feel so anxious and worried about things." Patient's affect flat, mood depressed but cooperative, pleasant. Forwards minimal information. Per self inventory and discussions with writer, rates depression at a 6/10, hopelessness at a 5/10 and anxiety at a 7/10. Rates sleep as fair, appetite as fair, energy as normal and concentration as good.  States goal for today is "depression, anxiety." Denies pain, physical complaints.   A: Medicated per orders, no prns requested or required. Level III obs in place for safety. Emotional support offered and self inventory reviewed. Encouraged completion of Suicide Safety Plan and programming participation. Discussed POC with MD, SW.   R: Patient verbalizes understanding of POC. Did not wish to take his 1200 neurontin at this time stating, "I just took it this morning. I think I will hold off." Patient denies SI/HI/AVH and remains safe on level III obs. Will continue to monitor closely and make verbal contact frequently.

## 2017-04-18 MED ORDER — GABAPENTIN 100 MG PO CAPS
100.0000 mg | ORAL_CAPSULE | Freq: Three times a day (TID) | ORAL | Status: DC
  Administered 2017-04-18 – 2017-04-19 (×3): 100 mg via ORAL
  Filled 2017-04-18: qty 48
  Filled 2017-04-18 (×2): qty 1
  Filled 2017-04-18: qty 48
  Filled 2017-04-18: qty 1
  Filled 2017-04-18: qty 48
  Filled 2017-04-18 (×2): qty 1

## 2017-04-18 MED ORDER — SERTRALINE HCL 50 MG PO TABS
50.0000 mg | ORAL_TABLET | Freq: Every day | ORAL | Status: DC
  Administered 2017-04-19: 50 mg via ORAL
  Filled 2017-04-18: qty 1
  Filled 2017-04-18: qty 16
  Filled 2017-04-18 (×2): qty 1

## 2017-04-18 NOTE — Tx Team (Signed)
Interdisciplinary Treatment and Diagnostic Plan Update  04/18/2017 Time of Session: 0830AM Thomas Vaughn MRN: [id number]  Principal Diagnosis: Bipolar disorder, unspecified ([name])  Secondary Diagnoses: Principal Problem:   Bipolar disorder, unspecified ([name]) Active Problems:   Alcohol abuse   Current Medications:  Current Facility-Administered Medications  Medication Dose Route Frequency Provider Last Rate Last Dose  . acetaminophen (TYLENOL) tablet 650 mg  650 mg Oral Q6H PRN Lindon Romp A, NP      . alum & mag hydroxide-simeth (MAALOX/MYLANTA) 200-200-20 MG/5ML suspension 30 mL  30 mL Oral Q4H PRN Lindon Romp A, NP      . famotidine (PEPCID) tablet 20 mg  20 mg Oral BID Lindon Romp A, NP   20 mg at 04/18/17 0810  . gabapentin (NEURONTIN) capsule 200 mg  200 mg Oral TID Cobos, Myer Peer, MD   200 mg at 04/17/17 1804  . hydrOXYzine (ATARAX/VISTARIL) tablet 25 mg  25 mg Oral Q6H PRN Lindon Romp A, NP   25 mg at 04/16/17 1142  . loperamide (IMODIUM) capsule 2-4 mg  2-4 mg Oral PRN Lindon Romp A, NP      . magnesium hydroxide (MILK OF MAGNESIA) suspension 30 mL  30 mL Oral Daily PRN Lindon Romp A, NP      . multivitamin with minerals tablet 1 tablet  1 tablet Oral Daily Lindon Romp A, NP   1 tablet at 04/18/17 0810  . OLANZapine (ZYPREXA) tablet 5 mg  5 mg Oral QHS Money, Lowry Ram, FNP   5 mg at 04/17/17 2151  . ondansetron (ZOFRAN-ODT) disintegrating tablet 4 mg  4 mg Oral Q6H PRN Lindon Romp A, NP      . pantoprazole (PROTONIX) EC tablet 40 mg  40 mg Oral Daily Lindon Romp A, NP   40 mg at 04/18/17 0810  . sertraline (ZOLOFT) tablet 25 mg  25 mg Oral Daily Cobos, Myer Peer, MD   25 mg at 04/18/17 0810  . thiamine (VITAMIN B-1) tablet 100 mg  100 mg Oral Daily Lindon Romp A, NP   100 mg at 04/18/17 0810  . traZODone (DESYREL) tablet 100 mg  100 mg Oral QHS PRN Money, Lowry Ram, FNP   100 mg at 04/17/17 2151   PTA Medications: Medications Prior to Admission  Medication Sig  Dispense Refill Last Dose  . albuterol (PROVENTIL HFA;VENTOLIN HFA) 108 (90 Base) MCG/ACT inhaler Inhale 1-2 puffs into the lungs every 6 (six) hours as needed for wheezing or shortness of breath. (Patient not taking: Reported on 04/15/2017) 1 Inhaler 0 Not Taking at Unknown time  . benzonatate (TESSALON) 100 MG capsule Take 1 capsule (100 mg total) by mouth every 8 (eight) hours. (Patient not taking: Reported on 04/15/2017) 21 capsule 0 Completed Course at Unknown time  . famotidine (PEPCID) 20 MG tablet Take 1 tablet (20 mg total) by mouth 2 (two) times daily. (Patient not taking: Reported on 02/13/2017) 60 tablet 1 Not Taking at Unknown time  . Guaifenesin (MUCINEX MAXIMUM STRENGTH) 1200 MG TB12 Take 1 tablet (1,200 mg total) by mouth every 12 (twelve) hours as needed. (Patient not taking: Reported on 04/15/2017) 14 tablet 0 Not Taking at Unknown time  . naproxen (NAPROSYN) 500 MG tablet Take 1 tablet (500 mg total) by mouth 2 (two) times daily. (Patient not taking: Reported on 04/15/2017) 20 tablet 0 Completed Course at Unknown time  . omeprazole (PRILOSEC) 20 MG capsule Take 1 capsule (20 mg total) by mouth daily for 7 days. (Patient not  taking: Reported on 04/15/2017) 7 capsule 0 Not Taking at Unknown time  . ondansetron (ZOFRAN) 4 MG tablet Take 1 tablet (4 mg total) by mouth every 6 (six) hours. (Patient not taking: Reported on 04/15/2017) 12 tablet 0 Completed Course at Unknown time  . pantoprazole (PROTONIX) 40 MG tablet Take 1 tablet (40 mg total) by mouth daily for 14 days. 14 tablet 0 Not Taking at Unknown time  . sucralfate (CARAFATE) 1 g tablet Take 1 tablet (1 g total) by mouth 4 (four) times daily -  with meals and at bedtime for 10 days. (Patient not taking: Reported on 04/15/2017) 40 tablet 0 Not Taking at Unknown time    Patient Stressors: Financial difficulties Marital or family conflict Substance abuse  Patient Strengths: Ability for insight Active sense of humor Average or above average  intelligence Capable of independent living Communication skills General fund of knowledge Motivation for treatment/growth  Treatment Modalities: Medication Management, Group therapy, Case management,  1 to 1 session with clinician, Psychoeducation, Recreational therapy.   Physician Treatment Plan for Primary Diagnosis: Bipolar disorder, unspecified ([place]) Long Term Goal(s): Improvement in symptoms so as ready for discharge Improvement in symptoms so as ready for discharge   Short Term Goals: Ability to verbalize feelings will improve Ability to disclose and discuss suicidal ideas Ability to demonstrate self-control will improve Ability to identify and develop effective coping behaviors will improve Ability to maintain clinical measurements within normal limits will improve Compliance with prescribed medications will improve  Medication Management: Evaluate patient's response, side effects, and tolerance of medication regimen.  Therapeutic Interventions: 1 to 1 sessions, Unit Group sessions and Medication administration.  Evaluation of Outcomes: Not Met  Physician Treatment Plan for Secondary Diagnosis: Principal Problem:   Bipolar disorder, unspecified ([place]) Active Problems:   Alcohol abuse  Long Term Goal(s): Improvement in symptoms so as ready for discharge Improvement in symptoms so as ready for discharge   Short Term Goals: Ability to verbalize feelings will improve Ability to disclose and discuss suicidal ideas Ability to demonstrate self-control will improve Ability to identify and develop effective coping behaviors will improve Ability to maintain clinical measurements within normal limits will improve Compliance with prescribed medications will improve     Medication Management: Evaluate patient's response, side effects, and tolerance of medication regimen.  Therapeutic Interventions: 1 to 1 sessions, Unit Group sessions and Medication administration.  Evaluation  of Outcomes: Not Met   RN Treatment Plan for Primary Diagnosis: Bipolar disorder, unspecified ([name]) Long Term Goal(s): Knowledge of disease and therapeutic regimen to maintain health will improve  Short Term Goals: Ability to remain free from injury will improve, Ability to disclose and discuss suicidal ideas and Ability to identify and develop effective coping behaviors will improve  Medication Management: RN will administer medications as ordered by provider, will assess and evaluate patient's response and provide education to patient for prescribed medication. RN will report any adverse and/or side effects to prescribing provider.  Therapeutic Interventions: 1 on 1 counseling sessions, Psychoeducation, Medication administration, Evaluate responses to treatment, Monitor vital signs and CBGs as ordered, Perform/monitor CIWA, COWS, AIMS and Fall Risk screenings as ordered, Perform wound care treatments as ordered.  Evaluation of Outcomes: Not Met   LCSW Treatment Plan for Primary Diagnosis: Bipolar disorder, unspecified ([place]) Long Term Goal(s): Safe transition to appropriate next level of care at discharge, Engage patient in therapeutic group addressing interpersonal concerns.  Short Term Goals: Engage patient in aftercare planning with referrals and resources, Facilitate patient  progression through stages of change regarding substance use diagnoses and concerns and Identify triggers associated with mental health/substance abuse issues  Therapeutic Interventions: Assess for all discharge needs, 1 to 1 time with Social worker, Explore available resources and support systems, Assess for adequacy in community support network, Educate family and significant other(s) on suicide prevention, Complete Psychosocial Assessment, Interpersonal group therapy.  Evaluation of Outcomes: Not Met   Progress in Treatment: Attending groups: No. Participating in groups: No. Taking medication as prescribed:  Yes. Toleration medication: Yes. Family/Significant other contact made: No, will contact:  pt's wife for collateral information/to complete SPE. Patient understands diagnosis: Yes. Discussing patient identified problems/goals with staff: Yes. Medical problems stabilized or resolved: Yes. Denies suicidal/homicidal ideation: Yes, self report.  Issues/concerns per patient self-inventory: No. Other: n/a   New problem(s) identified: No, Describe:  n/a  New Short Term/Long Term Goal(s): detox, medication management for mood stabilization; elimination of SI thoughts; development of comprehensive mental wellness/sobriety plan.   Patient Goal: "to get help with alcohol abuse."   Discharge Plan or Barriers: CSW assessing-pt plans to follow-up at [facility] until he relocates to [facility]. [place] pamphlet and AA information provided for additional community support.   Reason for Continuation of Hospitalization: Anxiety Depression Suicidal ideation Withdrawal symptoms  Estimated Length of Stay: Wed, 04/20/17  Attendees: Patient: Thomas Vaughn 04/18/2017 9:11 AM  Physician: Dr. Parke Poisson MD; Dr. Nancy Fetter MD 04/18/2017 9:11 AM  Nursing: Opal Sidles RN; Doris RN 04/18/2017 9:11 AM  RN Care Manager:x 04/18/2017 9:11 AM  Social Worker: Press photographer, LCSW 04/18/2017 9:11 AM  Recreational Therapist: x 04/18/2017 9:11 AM  Other: Lindell Spar NP; Ricky Ala NP 04/18/2017 9:11 AM  Other:  04/18/2017 9:11 AM  Other: 04/18/2017 9:11 AM    Scribe for Treatment Team: Kimber Relic Smart, LCSW 04/18/2017 9:11 AM

## 2017-04-18 NOTE — Progress Notes (Signed)
D   Pt is appropriate and pleasant on approach   His behavior is appropriate and he denies depression and anxiety  Reports improvement from first getting here  A    Verbal support given    Medications administered and effectiveness monitored    Q 15 min checks R   Pt is safe

## 2017-04-18 NOTE — [facility] Group Notes (Signed)
LCSW Group Therapy Note   04/18/2017 1:15pm   Type of Therapy and Topic:  Group Therapy:  Overcoming Obstacles   Participation Level:  Did Not Attend--pt chose to remain in hallway   Description of Group:    In this group patients will be encouraged to explore what they see as obstacles to their own wellness and recovery. They will be guided to discuss their thoughts, feelings, and behaviors related to these obstacles. The group will process together ways to cope with barriers, with attention given to specific choices patients can make. Each patient will be challenged to identify changes they are motivated to make in order to overcome their obstacles. This group will be process-oriented, with patients participating in exploration of their own experiences as well as giving and receiving support and challenge from other group members.   Therapeutic Goals: 1. Patient will identify personal and current obstacles as they relate to admission. 2. Patient will identify barriers that currently interfere with their wellness or overcoming obstacles.  3. Patient will identify feelings, thought process and behaviors related to these barriers. 4. Patient will identify two changes they are willing to make to overcome these obstacles:      Summary of Patient Progress   x   Therapeutic Modalities:   Cognitive Behavioral Therapy Solution Focused Therapy Motivational Interviewing Relapse Prevention Therapy  Ledell PeoplesHeather N Smart, LCSW 04/18/2017 12:18 PM

## 2017-04-18 NOTE — Progress Notes (Signed)
Patient ID: Thomas Vaughn, male   DOB: Aug 17, 1990, 27 y.o.   MRN: [id number]  D: Patient is visible in the milieu, and is interacting with his peers and staff, and currently denies SI/HI/AVH. Pt reports that his sleep quality last night was good, reports a fair appetite, reports his energy level as being low, and reports his concentration level as poor.  Pt denies being depressed, denies feeling hopeless and denies being anxious.  Pt denies being in any physical pain, and denies SI/HI/AVH.  Pt stated that he did not have any goal today, and reported that he is currently assisting the social workers with designing a discharge plan for him.    A: Patient given all of his medications as scheduled, and was educated on them as well and verbalized understanding.  Pt is being maintained on Q15 minute safety checks on the unit.  Pt refused his scheduled doses of Neurontin and stated that it makes him feel "loopy" and "weird".  Dr. Jama Flavorsobos was notified.  R: Pt being maintained on Q15 minute safety checks, will continue to monitor.

## 2017-04-18 NOTE — Progress Notes (Addendum)
Thomas Vaughn Progress Note  04/18/2017 4:57 PM Thomas Vaughn  MRN:  [id number]   Subjective: Patient reports that he is feeling better.  He denies suicidal ideations.  He is future oriented, and states that his plan is to relocate to [place] to live with his wife and children who are already there.  In order to do this he needs for his probation officer to approve the move and transfer his case to [place].  Reports this as his major stressor. Denies current alcohol withdrawal symptoms.  States he feels safe on unit but does describe having paranoid ideations prior to admission.  States "if a car parked nearby my house I would feel it was suspicious and called the police" "sometimes I felt that people were talking about me even when they were not".  He describes "hearing somebody breathing next to me" mostly in the evenings or when it is quiet.  States this is improving since admission.    Objective: I have discussed case with treatment team and have met with patient. 27 year old male, presented to ED with suicidal ideations.  History of alcohol use disorder, admission blood alcohol level 343. Facing significant stressors, mainly wife and children relocating to [place] recently and him being unable to join them due to legal/probation issues. At this time does not present with any symptoms of alcohol withdrawal.  Vitals are stable.  No tremors, no diaphoresis.  Patient describes improving "paranoia", and states he feels safe on unit.  Describes vague auditory hallucinations as above, now improving.  Currently does not appear internally preoccupied.  Visible on unit, interacting appropriately with peers, pleasant on approach. States that Neurontin specifically is causing him to feel "loopy", but also feels it is helping so wants to decrease those rather than discontinue medication.  Denies side effects from Zyprexa or Zoloft.    Principal Problem: Bipolar disorder, unspecified ([place]) Diagnosis:    Patient Active Problem List   Diagnosis Date Noted  . Alcohol abuse [F10.10] 04/16/2017  . Bipolar disorder, unspecified ([place]) [F31.9] 04/15/2017  . ADHD (attention deficit hyperactivity disorder) [F90.9]   . HYPOKALEMIA [E87.6] 07/29/2009  . ANEMIA OF OTHER CHRONIC DISEASE [D63.8] 07/29/2009  . GYNECOMASTIA [N62] 07/29/2009  . CHEST PAIN UNSPECIFIED [R07.9] 07/29/2009   Total Time spent with patient: 20 minutes  Past Psychiatric History: See H&P  Past Medical History:  Past Medical History:  Diagnosis Date  . ADHD (attention deficit hyperactivity disorder)   . Anxiety   . Asthma   . Depression    History reviewed. No pertinent surgical history. Family History:  Family History  Problem Relation Age of Onset  . Heart failure Mother    Family Psychiatric  History: See H&P Social History:  Social History   Substance and Sexual Activity  Alcohol Use Yes  . Alcohol/week: 4.2 oz  . Types: 6 Cans of beer, 1 Shots of liquor per week   Comment: twice a week     Social History   Substance and Sexual Activity  Drug Use No    Social History   Socioeconomic History  . Marital status: Married    Spouse name: Not on file  . Number of children: Not on file  . Years of education: Not on file  . Highest education level: Not on file  Occupational History  . Not on file  Social Needs  . Financial resource strain: Not on file  . Food insecurity:    Worry: Not on file    Inability: Not  on file  . Transportation needs:    Medical: Not on file    Non-medical: Not on file  Tobacco Use  . Smoking status: Current Some Day Smoker    Types: Cigarettes  . Smokeless tobacco: Never Used  Substance and Sexual Activity  . Alcohol use: Yes    Alcohol/week: 4.2 oz    Types: 6 Cans of beer, 1 Shots of liquor per week    Comment: twice a week  . Drug use: No  . Sexual activity: Yes    Birth control/protection: None  Lifestyle  . Physical activity:    Days per week: Not on file     Minutes per session: Not on file  . Stress: Not on file  Relationships  . Social connections:    Talks on phone: Not on file    Gets together: Not on file    Attends religious service: Not on file    Active member of club or organization: Not on file    Attends meetings of clubs or organizations: Not on file    Relationship status: Not on file  Other Topics Concern  . Not on file  Social History Narrative  . Not on file   Additional Social History:       Sleep: Improving  Appetite:  Good  Current Medications: Current Facility-Administered Medications  Medication Dose Route Frequency Provider Last Rate Last Dose  . acetaminophen (TYLENOL) tablet 650 mg  650 mg Oral Q6H PRN Lindon Romp A, NP      . alum & mag hydroxide-simeth (MAALOX/MYLANTA) 200-200-20 MG/5ML suspension 30 mL  30 mL Oral Q4H PRN Lindon Romp A, NP      . famotidine (PEPCID) tablet 20 mg  20 mg Oral BID Lindon Romp A, NP   20 mg at 04/18/17 0810  . gabapentin (NEURONTIN) capsule 100 mg  100 mg Oral TID Junior Kenedy A, Vaughn      . hydrOXYzine (ATARAX/VISTARIL) tablet 25 mg  25 mg Oral Q6H PRN Lindon Romp A, NP   25 mg at 04/16/17 1142  . loperamide (IMODIUM) capsule 2-4 mg  2-4 mg Oral PRN Lindon Romp A, NP      . magnesium hydroxide (MILK OF MAGNESIA) suspension 30 mL  30 mL Oral Daily PRN Lindon Romp A, NP      . multivitamin with minerals tablet 1 tablet  1 tablet Oral Daily Lindon Romp A, NP   1 tablet at 04/18/17 0810  . OLANZapine (ZYPREXA) tablet 5 mg  5 mg Oral QHS Money, Lowry Ram, FNP   5 mg at 04/17/17 2151  . ondansetron (ZOFRAN-ODT) disintegrating tablet 4 mg  4 mg Oral Q6H PRN Lindon Romp A, NP      . pantoprazole (PROTONIX) EC tablet 40 mg  40 mg Oral Daily Lindon Romp A, NP   40 mg at 04/18/17 0810  . sertraline (ZOLOFT) tablet 25 mg  25 mg Oral Daily Kassondra Geil, Myer Peer, Vaughn   25 mg at 04/18/17 0810  . thiamine (VITAMIN B-1) tablet 100 mg  100 mg Oral Daily Lindon Romp A, NP   100 mg at  04/18/17 0810  . traZODone (DESYREL) tablet 100 mg  100 mg Oral QHS PRN Money, Lowry Ram, FNP   100 mg at 04/17/17 2151    Lab Results:  No results found for this or any previous visit (from the past 48 hour(s)).  Blood Alcohol level:  Lab Results  Component Value Date   ETH 12 (H)  04/15/2017   ETH 343 (HH) 81/82/9937    Metabolic Disorder Labs: No results found for: HGBA1C, MPG No results found for: PROLACTIN No results found for: CHOL, TRIG, HDL, CHOLHDL, VLDL, LDLCALC  Physical Findings: AIMS: Facial and Oral Movements Muscles of Facial Expression: None, normal Lips and Perioral Area: None, normal Jaw: None, normal Tongue: None, normal,Extremity Movements Upper (arms, wrists, hands, fingers): None, normal Lower (legs, knees, ankles, toes): None, normal, Trunk Movements Neck, shoulders, hips: None, normal, Overall Severity Severity of abnormal movements (highest score from questions above): None, normal Incapacitation due to abnormal movements: None, normal Patient's awareness of abnormal movements (rate only patient's report): No Awareness, Dental Status Current problems with teeth and/or dentures?: No Does patient usually wear dentures?: No  CIWA:    COWS:     Musculoskeletal: Strength & Muscle Tone: within normal limits Gait & Station: normal Patient leans: N/A  Psychiatric Specialty Exam: Physical Exam  Nursing note and vitals reviewed. Constitutional: He is oriented to person, place, and time. He appears well-developed and well-nourished.  Cardiovascular: Normal rate.  Respiratory: Effort normal.  Musculoskeletal: Normal range of motion.  Neurological: He is alert and oriented to person, place, and time.  Skin: Skin is warm.    Review of Systems  Constitutional: Negative.   HENT: Negative.   Eyes: Negative.   Respiratory: Negative.   Cardiovascular: Negative.   Gastrointestinal: Negative.   Genitourinary: Negative.   Musculoskeletal: Negative.   Skin:  Negative.   Neurological: Negative.   Endo/Heme/Allergies: Negative.   Psychiatric/Behavioral: Positive for depression and substance abuse. Negative for hallucinations and suicidal ideas. The patient has insomnia.   Describes feeling vaguely overmedicated on current Neurontin dose, denies chest pain, denies shortness of breath, denies vomiting.  Blood pressure 126/86, pulse 74, temperature 98 F (36.7 C), temperature source Oral, resp. rate 18, height 5' 8"  (1.727 m), weight 76.7 kg (169 lb).Body mass index is 25.7 kg/m.  General Appearance: Improving grooming  Eye Contact:  Good  Speech:  Normal Rate and Not pressured  Volume:  Normal  Mood:  States his mood is better  Affect:  Reactive, vaguely anxious  Thought Process:  Linear and Descriptions of Associations: Intact  Orientation:  Full (Time, Place, and Person)  Thought Content:  Reports vague auditory hallucinations ( breathing next to him), does not appear internally preoccupied, describes self-referential ideations, but not currently expressing overt delusional ideations   Suicidal Thoughts:  No denies suicidal or self-injurious ideations, denies homicidal ideations, contracts for safety on unit  Homicidal Thoughts:  No  Memory:  Recent and remote grossly intact  Judgement:  improving  Insight: Improving  Psychomotor Activity:  Normal-no tremors, no psychomotor agitation, no diaphoresis  Concentration:  Concentration: Good and Attention Span: Good  Recall:  Good  Fund of Knowledge:  Good  Language:  Good  Akathisia:  No  Handed:  Right  AIMS (if indicated):     Assets:  Communication Skills Desire for Improvement Financial Resources/Insurance Housing Physical Health Social Support  ADL's:  Intact  Cognition:  WNL  Sleep:  Number of Hours: 5.5   Assessment-patient presents with improvement compared to admission.  Denies suicidal ideations.  Is future oriented and hopeful to relocate to [place] in the near future.  He is  also expressing interest in going to a rehab at discharge.  Currently not presenting with any severe or significant alcohol withdrawal symptoms and vitals are stable.  Reports feeling current Neurontin dose making him feel overmedicated, but overall feels  medications are helpful and well-tolerated.  Currently not presenting with symptoms of mania.  Treatment Plan Summary: Treatment plan reviewed as below today April 8 Daily contact with patient to assess and evaluate symptoms and progress in treatment, Medication management and Plan is to:  -Continue Zyprexa 5 mg PO QHS for mood disorder, psychosis -Increase Zoloft to 50 mg PO Daily for mood and anxiety -Decrease Neurontin to 100 mg PO TID for anxiety -Continue Trazodone 50 mg PO QHS PRN for insomnia -Continue Vistaril 25 mg Q6H PRN for anxiety -Encourage group therapy participation to work on coping skills and symptom reduction -Encouraged ongoing efforts to work on sobriety, relapse prevention -Check HgbA1C, Lipid Panel as on Zyprexa -Treatment team working on disposition [place], Vaughn 04/18/2017, 4:57 PM  Patient ID: Tiburcio Pea, male   DOB: 01-Nov-1990, 27 y.o.   MRN: [id number]

## 2017-04-18 NOTE — Progress Notes (Signed)
Recreation Therapy Notes  Date: 4.8.19 Time: 9:30 a.m. Location: [address] Day[address]   Group Topic: Stress Management   Goal Area(s) Addresses:  Goal 1.1: To reduce stress  -Patient will feel a reduction in stress level  -Patient will learn the importance of stress management  -Patient will participate during stress management group      Intervention: Stress Management   Activity: Meditation- Patients were in a peaceful environment with soft lighting enhancing patients mood. Patients listened to a body scan meditation to help decrease tension and stress levels    Education: Stress Management, Discharge Planning.    Education Outcome: Acknowledges edcuation/In group clarification offered/Needs additional education   Clinical Observations/Feedback:: Patient did not attend     Gwendolynn Merkey, Recreation Therapy Intern   Devyn Griffing 04/18/2017 8:30 AM 

## 2017-04-18 NOTE — Progress Notes (Signed)
D   Pt was hyper verbal and very animated at the medication window    His interactions are appropriate   He smiles and jokes around a lot with others  A   Verbal support given    Medications administered and effectiveness monitored    Q 15 min checks  R    Pt is safe at present time

## 2017-04-18 NOTE — Progress Notes (Signed)
Adult Psychoeducational Group Note  Date:  04/18/2017 Time:  10:03 PM  Group Topic/Focus:  Wrap-Up Group:   The focus of this group is to help patients review their daily goal of treatment and discuss progress on daily workbooks.  Participation Level:  Active  Participation Quality:  Appropriate  Affect:  Appropriate  Cognitive:  Alert  Insight: Appropriate  Engagement in Group:  Engaged  Modes of Intervention:  Discussion  Additional Comments:  Pt stated that his goal is to get his medication adjustment. He stated that he doesn't feel the current regimen is working b/c he is continuing to experience mood swings.   Kaleen OdeaCOOKE, Brayley Mackowiak R 04/18/2017, 10:03 PM

## 2017-04-19 LAB — LIPID PANEL
CHOL/HDL RATIO: 1.9 ratio
CHOLESTEROL: 155 mg/dL (ref 0–200)
HDL: 82 mg/dL (ref >40)
LDL CALC: 46 mg/dL (ref 0–99)
TRIGLYCERIDES: 137 mg/dL (ref <150)
VLDL: 27 mg/dL (ref 0–40)

## 2017-04-19 LAB — HEMOGLOBIN A1C
Hgb A1c MFr Bld: 5.2 % (ref 4.8–5.6)
Mean Plasma Glucose: 102.54 mg/dL

## 2017-04-19 MED ORDER — GABAPENTIN 100 MG PO CAPS: 100.0000 mg | ORAL_CAPSULE | Freq: Three times a day (TID) | ORAL | 0 refills | Status: DC

## 2017-04-19 MED ORDER — SERTRALINE HCL 50 MG PO TABS: 50.0000 mg | ORAL_TABLET | Freq: Every day | ORAL | 0 refills | Status: DC

## 2017-04-19 MED ORDER — TRAZODONE HCL 100 MG PO TABS: 100.0000 mg | ORAL_TABLET | Freq: Every evening | ORAL | 0 refills | Status: DC | PRN

## 2017-04-19 MED ORDER — PANTOPRAZOLE SODIUM 40 MG PO TBEC: 40.0000 mg | DELAYED_RELEASE_TABLET | Freq: Every day | ORAL | 0 refills | Status: DC

## 2017-04-19 MED ORDER — FAMOTIDINE 20 MG PO TABS: 20.0000 mg | ORAL_TABLET | Freq: Two times a day (BID) | ORAL | 0 refills | Status: DC

## 2017-04-19 MED ORDER — OLANZAPINE 5 MG PO TABS: 5.0000 mg | ORAL_TABLET | Freq: Every day | ORAL | 0 refills | Status: DC

## 2017-04-19 NOTE — Progress Notes (Signed)
Patient verbalizes readiness for discharge. Follow up plan explained, AVS, Transition record and SRA given. Prescriptions and teaching provided. Sample medications given. Belongings returned and signed for. Suicide safety plan completed and signed. Patient verbalizes understanding. Patient denies SI/HI and assures this Clinical research associatewriter he will seek assistance should that change. Patient discharged to lobby; waiting on uber driver.

## 2017-04-19 NOTE — Progress Notes (Signed)
  [facility] Management Discharge Plan :  Will you be returning to the same living situation after discharge:  No. Patient is living with his mother, Kayren Eavesntoinette Geurts at discharge.  At discharge, do you have transportation home?: Yes,  bus pass Do you have the ability to pay for your medications: Yes,  Medicaid\  Release of information consent forms completed and in the chart;  Patient's signature needed at discharge.  Patient to Follow up at: Follow-up Information    [facility] Follow up.   Specialty:  Behavioral Health Why:  [facility] and therapy [facility]. Walk-in hours are Monday-Friday 8:00am-5:00pm.  Contact information: [address] [place] [address] [phone]        Services, [facility] Follow up.   Why:  Appointment is 04/21/17 at 8:00am Contact information: [address] [place] [address] [phone]           Next level of care provider [facility] Link:yes  Safety Planning and Suicide Prevention discussed: Yes,  with the patient's mother, Kayren Eavesntoinette Disla  Have you used any form of tobacco in the last 30 days? (Cigarettes, Smokeless Tobacco, Cigars, and/or Pipes): Yes  Has patient been referred to the Quitline?: Patient refused referral  Patient has been referred for addiction treatment: Yes  Maeola SarahJolan E Harmonii Karle, LCSWA 04/19/2017, 10:57 AM

## 2017-04-19 NOTE — Progress Notes (Signed)
Recreation Therapy Notes   Animal-Assisted Activity (AAA) Program Checklist/Progress Notes Patient Eligibility Criteria Checklist & Daily Group note for Rec Tx Intervention Date: 4.9.19. Time: 2:45 p.m.  Location[address]   AAA/T Program Assumption of Risk Form signed by Patient/ or Parent Legal Guardian Yes  Patient is free of allergies or sever asthma Yes  Patient reports no fear of animals Yes  Patient reports no history of cruelty to animals Yes  Patient understands his/her participation is voluntary Yes  Patient washes hands before animal contact Yes  Patient washes hands after animal contact Yes  Education: Hand Washing, Appropriate Animal Interaction   Education Outcome: Acknowledges Education.   Clinical Observations/Feedback: Patient did not attend   Sheryle Hailarian Thomas Vaughn, Recreation Therapy Intern   Sheryle HailDarian Thomas Vaughn 04/19/2017 1:58 PM

## 2017-04-19 NOTE — Discharge Summary (Signed)
Physician Discharge Summary Note  Patient:  Thomas Vaughn is an 27 y.o., male MRN:  [id number] DOB:  Dec 17, 1990 Patient phone:  [phone] (home)  Patient address:   [address] [place] [address],  Total Time spent with patient: 20 minutes  Date of Admission:  04/15/2017 Date of Discharge: 04/19/17  Reason for Admission:  Worsening depression with SI and SUD  Principal Problem: Bipolar disorder, unspecified [facility](HCC) Discharge Diagnoses: Patient Active Problem List   Diagnosis Date Noted  . Alcohol abuse [F10.10] 04/16/2017  . Bipolar disorder, unspecified (HCC) [F31.9] 04/15/2017  . ADHD (attention deficit hyperactivity disorder) [F90.9]   . HYPOKALEMIA [E87.6] 07/29/2009  . ANEMIA OF OTHER CHRONIC DISEASE [D63.8] 07/29/2009  . GYNECOMASTIA [N62] 07/29/2009  . CHEST PAIN UNSPECIFIED [R07.9] 07/29/2009    Past Psychiatric History: No hospitalizations, Reports a telepsych with [facility] 2 years ago.  Past Medical History:  Past Medical History:  Diagnosis Date  . ADHD (attention deficit hyperactivity disorder)   . Anxiety   . Asthma   . Depression    History reviewed. No pertinent surgical history. Family History:  Family History  Problem Relation Age of Onset  . Heart failure Mother    Family Psychiatric  History: Mom- depression  Social History:  Social History   Substance and Sexual Activity  Alcohol Use Yes  . Alcohol/week: 4.2 oz  . Types: 6 Cans of beer, 1 Shots of liquor per week   Comment: twice a week     Social History   Substance and Sexual Activity  Drug Use No    Social History   Socioeconomic History  . Marital status: Married    Spouse name: Not on file  . Number of children: Not on file  . Years of education: Not on file  . Highest education level: Not on file  Occupational History  . Not on file  Social Needs  . Financial resource strain: Not on file  . Food insecurity:    Worry: Not on file    Inability: Not on file   . Transportation needs:    Medical: Not on file    Non-medical: Not on file  Tobacco Use  . Smoking status: Current Some Day Smoker    Types: Cigarettes  . Smokeless tobacco: Never Used  Substance and Sexual Activity  . Alcohol use: Yes    Alcohol/week: 4.2 oz    Types: 6 Cans of beer, 1 Shots of liquor per week    Comment: twice a week  . Drug use: No  . Sexual activity: Yes    Birth control/protection: None  Lifestyle  . Physical activity:    Days per week: Not on file    Minutes per session: Not on file  . Stress: Not on file  Relationships  . Social connections:    Talks on phone: Not on file    Gets together: Not on file    Attends religious service: Not on file    Active member of club or organization: Not on file    Attends meetings of clubs or organizations: Not on file    Relationship status: Not on file  Other Topics Concern  . Not on file  Social History Narrative  . Not on file    [facility] Course:   04/15/17 [facility]: 27 y.o.male.Clinician reviewed note by Thomas Vaughn. Patient states that "I  was going to drink myself to death". Patient states that he drank a "shift time "of alcohol this evening in order to hurt himself. Patient did report some nausea which has since resolved. Patient reports chronic alcohol use but cannot quantify how much alcohol he drank this evening.Patient continues to endorse suicidal thoughts. He says he attempted to kill himself before, by the same method of drinking himself to death. Patient says "I have drank non-stop for the last 3 days. Patient has had SI over the last 6 months. Patient denies any HI or A/V hallucinations. Patient says "life is overwhelming." He says he has 3 baby mamas and 7 children. Patient says he is married and his wife is moving to [place] and  he is supposed to join her in a few days. Patient says he drinks every other day but over the last 3 days it has been constant. Patient is positive for THC but denies using it. Patient went to [facility] for emergency services about a month ago. He has not been inpatient anywhere and he has no current provider.  04/16/17 Thomas Vaughn: Patient confirms above information, but adds that he has been hearing like someone is breathing heavily. "I only hear it when I am sitting in a quiet room alone." He reports that 2 years ago he went to [facility] and they gave him medication and he had to go to the [facility] for a reaction. He reports manic symptoms of staying awake for days, then crashing, racing thoughts, hyperverbal, and spending Kameren Pargas on random things but mainly alcohol. He reports a long history of alcohol abuse, but states that he doesn't ever have withdrawal symptoms. "I went to prison for 4 months and I only had some jitters." Last alcoholic drink was Thursady 04/14/17. He reports and presents with Bipolar symptoms. He also reports being labile and having irritable and agitated moods frequently.   Patient remained on the [facility] unit for 3 days. The patient stabilized on medication and therapy. Patient was discharged on Zyprexa 5 mg QHS, Zoloft 50 mg Daily, Trazodone 50 mg QHS PRN, and Neurontin 100 mg TID. Patient has shown improvement with improved mood, affect, sleep, appetite, and interaction. Patient has attended group and participated. Patient has been seen in the day room interacting with peers and staff appropriately. Patient denies any SI/HI/AVH and contracts for safety. Patient agrees to follow up at [facility] and has a screening at [facility] on 04/21/17. Patinet states he will stay at his mom's house until Thursday. Patient is provided with prescriptions for their medications upon discharge.     Physical Findings: AIMS: Facial and Oral Movements Muscles of Facial Expression: None,  normal Lips and Perioral Area: None, normal Jaw: None, normal Tongue: None, normal,Extremity Movements Upper (arms, wrists, hands, fingers): None, normal Lower (legs, knees, ankles, toes): None, normal, Trunk Movements Neck, shoulders, hips: None, normal, Overall Severity Severity of abnormal movements (highest score from questions above): None, normal Incapacitation due to abnormal movements: None, normal Patient's awareness of abnormal movements (rate only patient's report): No Awareness, Dental Status Current problems with teeth and/or dentures?: No Does patient usually wear dentures?: No  CIWA:    COWS:     Musculoskeletal: Strength & Muscle Tone: within normal limits Gait & Station: normal Patient leans: N/A  Psychiatric Specialty Exam: Physical Exam  Nursing note and vitals reviewed. Constitutional: He is oriented to person, place, and time. He appears well-developed and well-nourished.  Cardiovascular: Normal rate.  Respiratory: Effort normal.  Musculoskeletal: Normal  range of motion.  Neurological: He is alert and oriented to person, place, and time.  Skin: Skin is warm.    Review of Systems  Constitutional: Negative.   HENT: Negative.   Eyes: Negative.   Respiratory: Negative.   Cardiovascular: Negative.   Gastrointestinal: Negative.   Genitourinary: Negative.   Musculoskeletal: Negative.   Skin: Negative.   Neurological: Negative.   Endo/Heme/Allergies: Negative.   Psychiatric/Behavioral: Negative.     Blood pressure 124/81, pulse 91, temperature 97.6 F (36.4 C), temperature source Oral, resp. rate 16, height 5\' 8"  (1.727 m), weight 76.7 kg (169 lb).Body mass index is 25.7 kg/m.  General Appearance: Casual  Eye Contact:  Good  Speech:  Clear and Coherent and Normal Rate  Volume:  Normal  Mood:  Euthymic  Affect:  Congruent  Thought Process:  Goal Directed and Descriptions of Associations: Intact  Orientation:  Full (Time, Place, and Person)  Thought  Content:  WDL  Suicidal Thoughts:  No  Homicidal Thoughts:  No  Memory:  Immediate;   Good Recent;   Good Remote;   Good  Judgement:  Fair  Insight:  Good  Psychomotor Activity:  Normal  Concentration:  Concentration: Good and Attention Span: Good  Recall:  Good  Fund of Knowledge:  Good  Language:  Good  Akathisia:  No  Handed:  Right  AIMS (if indicated):     Assets:  Communication Skills Desire for Improvement Financial Resources/Insurance Housing Physical Health Social Support Transportation  ADL's:  Intact  Cognition:  WNL  Sleep:  Number of Hours: 6.25     Have you used any form of tobacco in the last 30 days? (Cigarettes, Smokeless Tobacco, Cigars, and/or Pipes): Yes  Has this patient used any form of tobacco in the last 30 days? (Cigarettes, Smokeless Tobacco, Cigars, and/or Pipes) Yes, Yes, A prescription for an FDA-approved tobacco cessation medication was offered at discharge and the patient refused  Blood Alcohol level:  Lab Results  Component Value Date   ETH 12 (H) 04/15/2017   ETH 343 (HH) 04/14/2017    Metabolic Disorder Labs:  No results found for: HGBA1C, MPG No results found for: PROLACTIN Lab Results  Component Value Date   CHOL 155 04/19/2017   TRIG 137 04/19/2017   HDL 82 04/19/2017   CHOLHDL 1.9 04/19/2017   VLDL 27 04/19/2017   LDLCALC 46 04/19/2017    See Psychiatric Specialty Exam and Suicide Risk Vaughn completed by Attending Physician prior to discharge.  Discharge destination:  Home  Is patient on multiple antipsychotic therapies at discharge:  No   Has Patient had three or more failed trials of antipsychotic monotherapy by history:  No  Recommended Plan for Multiple Antipsychotic Therapies: NA   Allergies as of 04/19/2017      Reactions   Lorazepam Other (See Comments), Hypertension   Caused B/P to escalate to >200/100      Medication List    STOP taking these medications   albuterol 108 (90 Base) MCG/ACT  inhaler Commonly known as:  PROVENTIL HFA;VENTOLIN HFA   benzonatate 100 MG capsule Commonly known as:  TESSALON   Guaifenesin 1200 MG Tb12 Commonly known as:  MUCINEX MAXIMUM STRENGTH   naproxen 500 MG tablet Commonly known as:  NAPROSYN   omeprazole 20 MG capsule Commonly known as:  PRILOSEC   ondansetron 4 MG tablet Commonly known as:  ZOFRAN   sucralfate 1 g tablet Commonly known as:  CARAFATE     TAKE these medications  Indication  famotidine 20 MG tablet Commonly known as:  PEPCID Take 1 tablet (20 mg total) by mouth 2 (two) times daily.  Indication:  Gastroesophageal Reflux Disease   gabapentin 100 MG capsule Commonly known as:  NEURONTIN Take 1 capsule (100 mg total) by mouth 3 (three) times daily. For withdrawal symptoms  Indication:  Alcohol Withdrawal Syndrome   OLANZapine 5 MG tablet Commonly known as:  ZYPREXA Take 1 tablet (5 mg total) by mouth at bedtime. For mood control  Indication:  mood stability   pantoprazole 40 MG tablet Commonly known as:  PROTONIX Take 1 tablet (40 mg total) by mouth daily. For GERD What changed:  additional instructions  Indication:  Gastroesophageal Reflux Disease   sertraline 50 MG tablet Commonly known as:  ZOLOFT Take 1 tablet (50 mg total) by mouth daily. For mood control Start taking on:  04/20/2017  Indication:  mood stability   traZODone 100 MG tablet Commonly known as:  DESYREL Take 1 tablet (100 mg total) by mouth at bedtime as needed for sleep.  Indication:  Trouble Sleeping      Follow-up Information    [facility] Follow up.   Specialty:  Behavioral Health Why:  Please follow up once you have discharged from [facility] for medication management and therapy services. Walk-in hours are Monday-Friday 8:00am-5:00pm.  Contact information: [address] [place] [address] [phone]        [facility], [facility] Follow up.   Why:  Appointment is 04/21/17 at 8:00am Contact information: [address] [place] [address] [phone]           Follow-up recommendations:  Continue activity as tolerated. Continue diet as recommended by your PCP. Ensure to keep all appointments with outpatient providers.  Comments:  Patient is instructed prior to discharge to: Take all medications as prescribed by his/her mental healthcare provider. Report any adverse effects and or reactions from the medicines to his/her outpatient provider promptly. Patient has been instructed & cautioned: To not engage in alcohol and or illegal drug use while on prescription medicines. In the event of worsening symptoms, patient is instructed to call the crisis hotline, 911 and or go to the nearest ED for appropriate evaluation and treatment of symptoms. To follow-up with his/her primary care provider for your other medical issues, concerns and or health care needs.    Signed: Gerlene Burdock Laurie Lovejoy, FNP 04/19/2017, 11:02 AM

## 2017-04-19 NOTE — Progress Notes (Signed)
D: pt found in his bed this morning. Requested to sleep "a little longer". Pt given opportunity to sleep another hour. Pt then very cooperative with medication compliance. Pt stated he "just wants to go back to bed though". Pt stated he didn't sleep well.  A: medications given per protocol/orders. 15 minute checks continued. R: pt contracts for safety. Will continue to monitor. Currently safe on the unit.

## 2017-04-19 NOTE — [facility] Suicide Risk Assessment (Signed)
[facility]   Principal Problem: Bipolar disorder, unspecified [facility](HCC) Discharge Diagnoses:  Patient Active Problem List   Diagnosis Date Noted  . Alcohol abuse [F10.10] 04/16/2017  . Bipolar disorder, unspecified (HCC) [F31.9] 04/15/2017  . ADHD (attention deficit hyperactivity disorder) [F90.9]   . HYPOKALEMIA [E87.6] 07/29/2009  . ANEMIA OF OTHER CHRONIC DISEASE [D63.8] 07/29/2009  . GYNECOMASTIA [N62] 07/29/2009  . CHEST PAIN UNSPECIFIED [R07.9] 07/29/2009    Total Time spent with patient: 30 minutes  Musculoskeletal: Strength & Muscle Tone: within normal limits Gait & Station: normal Patient leans: N/A  Psychiatric Specialty Exam: Review of Systems  All other systems reviewed and are negative.   Blood pressure 124/81, pulse 91, temperature 97.6 F (36.4 C), temperature source Oral, resp. rate 16, height 5\' 8"  (1.727 m), weight 76.7 kg (169 lb).Body mass index is 25.7 kg/m.  General Appearance: Fairly Groomed  Patent attorneyye Contact::  Fair  Speech:  Clear and Coherent409  Volume:  Normal  Mood:  Euthymic  Affect:  Congruent  Thought Process:  Coherent  Orientation:  Full (Time, Place, and Person)  Thought Content:  Logical  Suicidal Thoughts:  No  Homicidal Thoughts:  No  Memory:  Immediate;   Fair  Judgement:  Fair  Insight:  Fair  Psychomotor Activity:  Normal  Concentration:  Fair  Recall:  Fis[employer]Fair  Fund of Knowledge:Fair  Language: Good  Akathisia:  No  Handed:  Right  AIMS (if indicated):     Assets:  Communication Skills Desire for Improvement Physical Health Resilience  Sleep:  Number of Hours: 6.25  Cognition: WNL  ADL's:  Intact   Mental Status Per Nursing Assessment::   On Admission:     Demographic Factors:  Male, Low socioeconomic status and Unemployed  Loss Factors: Financial problems/change in socioeconomic status  Historical Factors: Impulsivity  Risk Reduction Factors:   Positive coping skills or problem solving  skills  Continued Clinical Symptoms:  Alcohol/Substance Abuse/Dependencies Previous Psychiatric Diagnoses and Treatments  Cognitive Features That Contribute To Risk:  None    Suicide Risk:  Minimal: No identifiable suicidal ideation.  Patients presenting with no risk factors but with morbid ruminations; may be classified as minimal risk based on the severity of the depressive symptoms  Follow-up Information    [facility] Follow up.   Specialty:  Behavioral Health Why:  appt needed.  Contact information: [address] [place] [address] [phone]        [facility] Follow up.   Why:  Appointment is 04/21/17 at 8:00am Contact information: [address] [place] [address] [phone]           Plan Of Care/Follow-up recommendations:  Activity:  ad lib  Antonieta PertGreg Lawson Clary, MD 04/19/2017, 10:40 AM

## 2017-10-30 IMAGING — CR DG CHEST 2V
2 series · 2 of 2 positions shown · non-contrast
Comparison: 12/14/2015

CLINICAL DATA: Left side chest pain, shortness of breath starting
this morning

EXAM:
CHEST  2 VIEW

[chest pa]
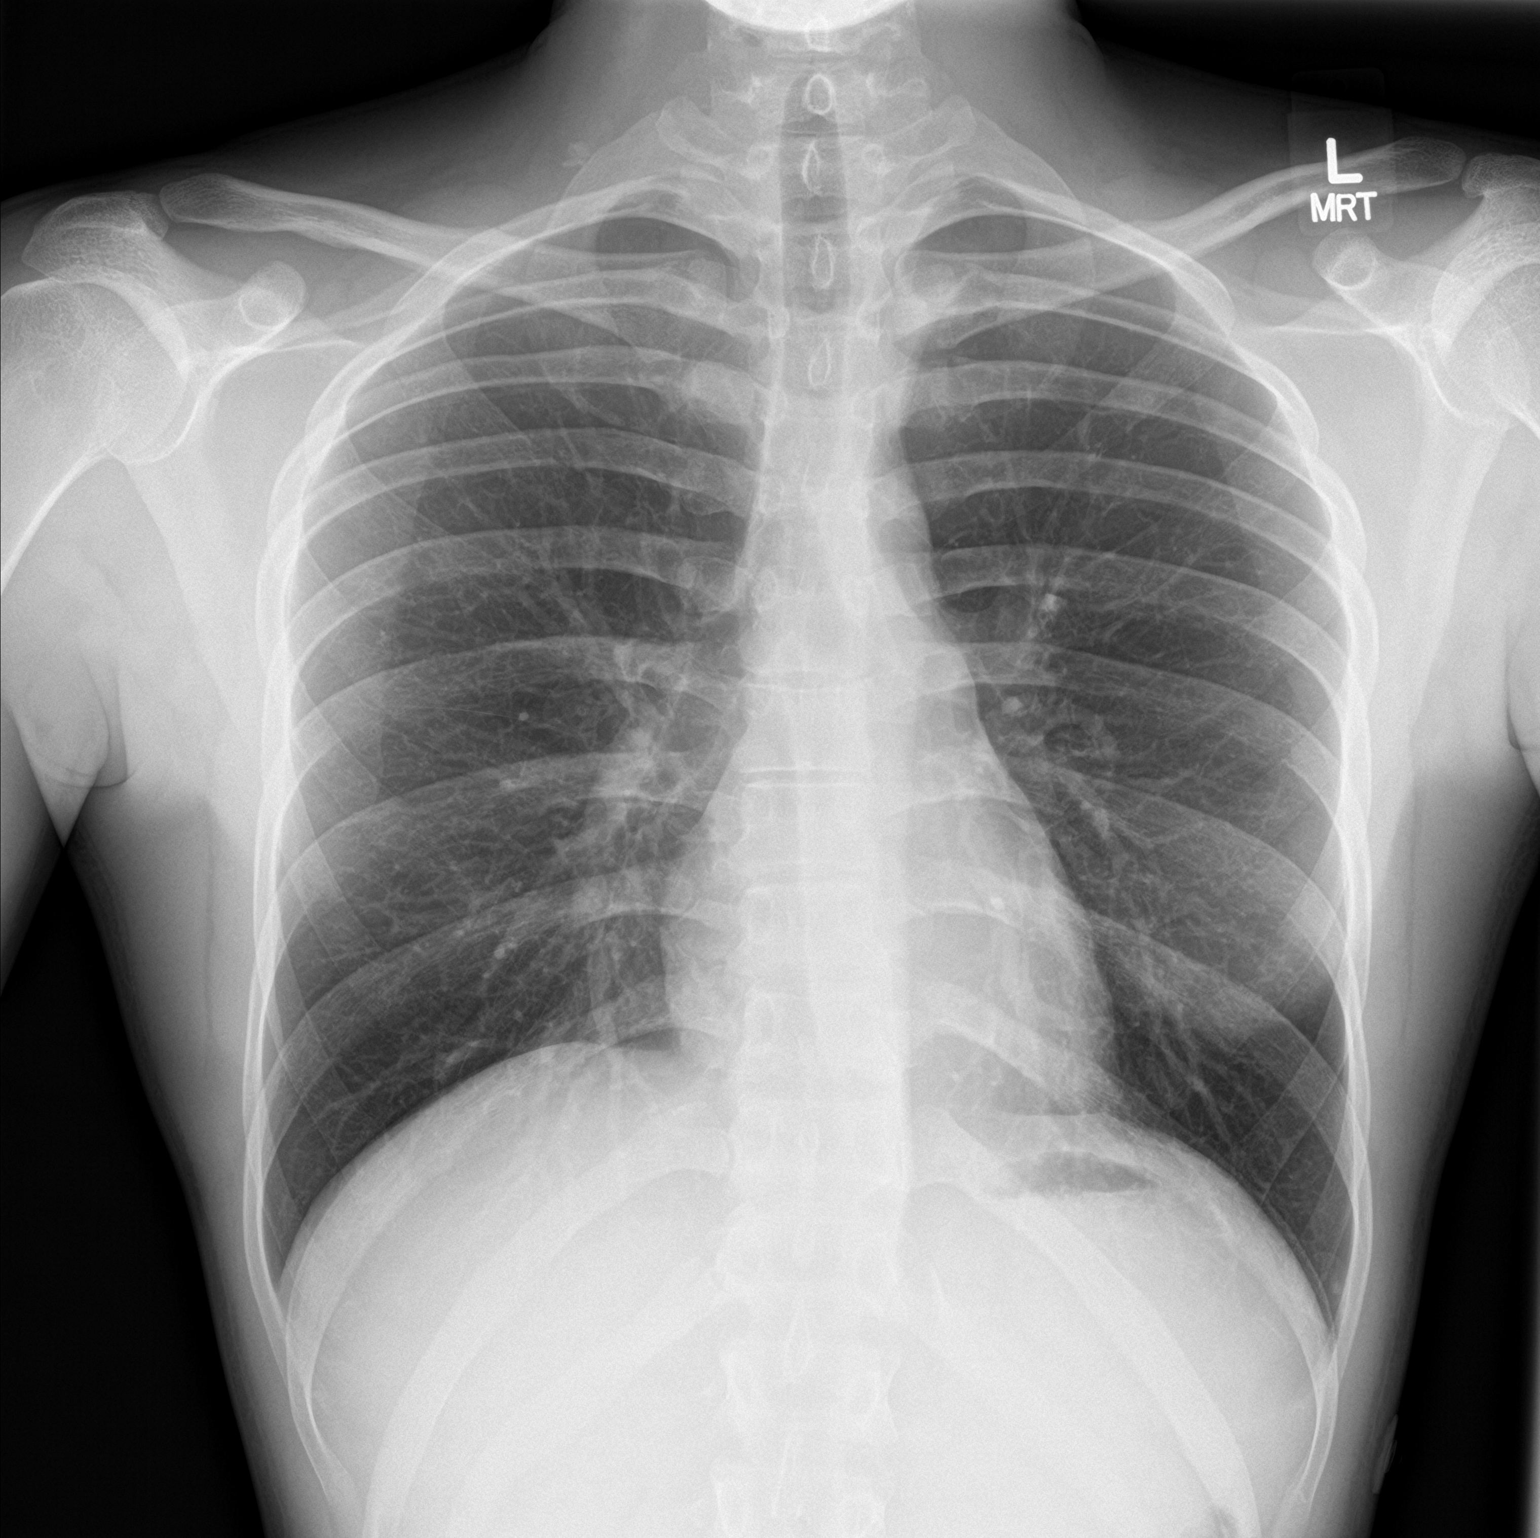

[chest lat]
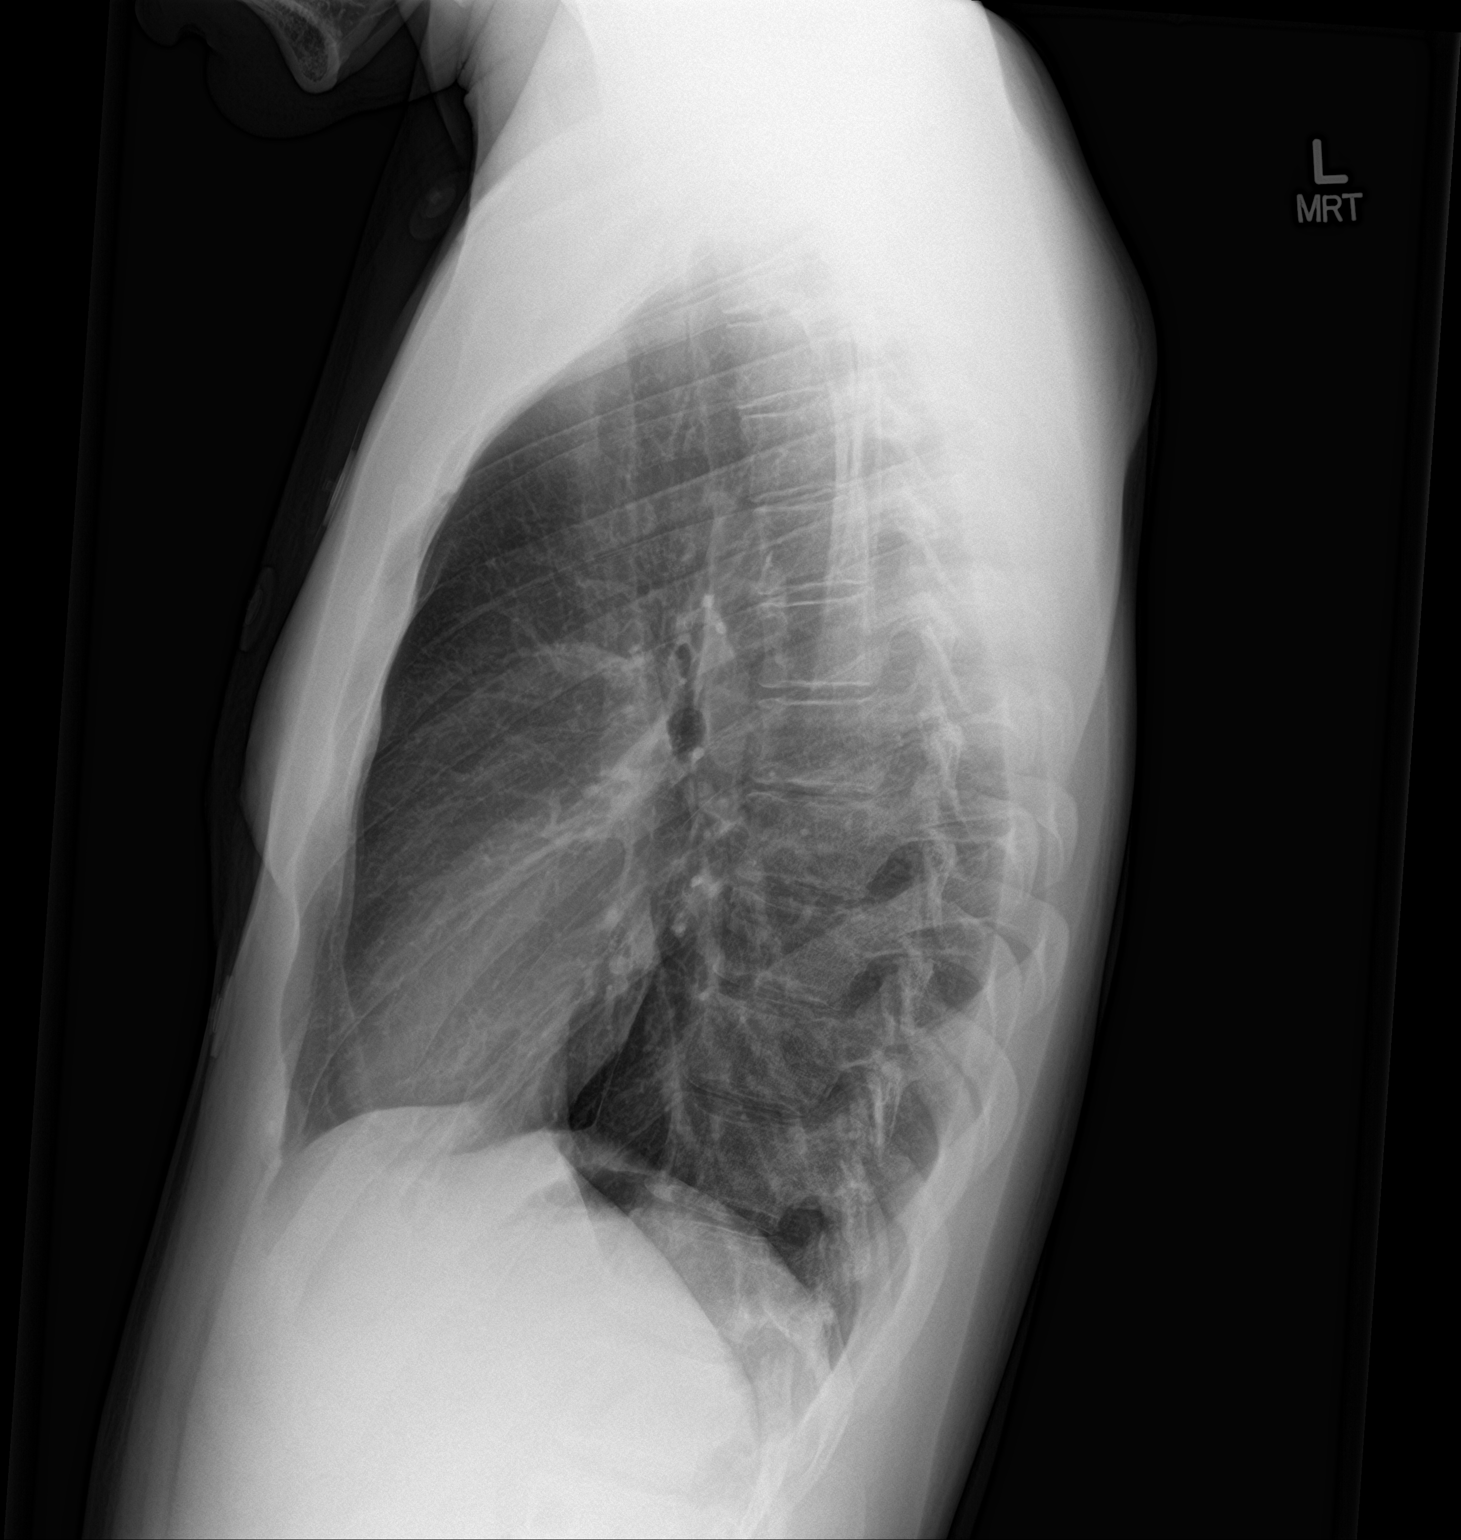

[2 of 2 positions shown; findings below may reference images not displayed]

FINDINGS: The heart size and mediastinal contours are within normal limits.
Both lungs are clear. The visualized skeletal structures are
unremarkable.
IMPRESSION: No active cardiopulmonary disease.

## 2019-02-09 IMAGING — CR DG CHEST 2V
2 series · 2 of 2 positions shown · non-contrast
Comparison: 03/15/2017

CLINICAL DATA: Shortness of breath and central chest pain.

EXAM:
CHEST - 2 VIEW

[chest pa]
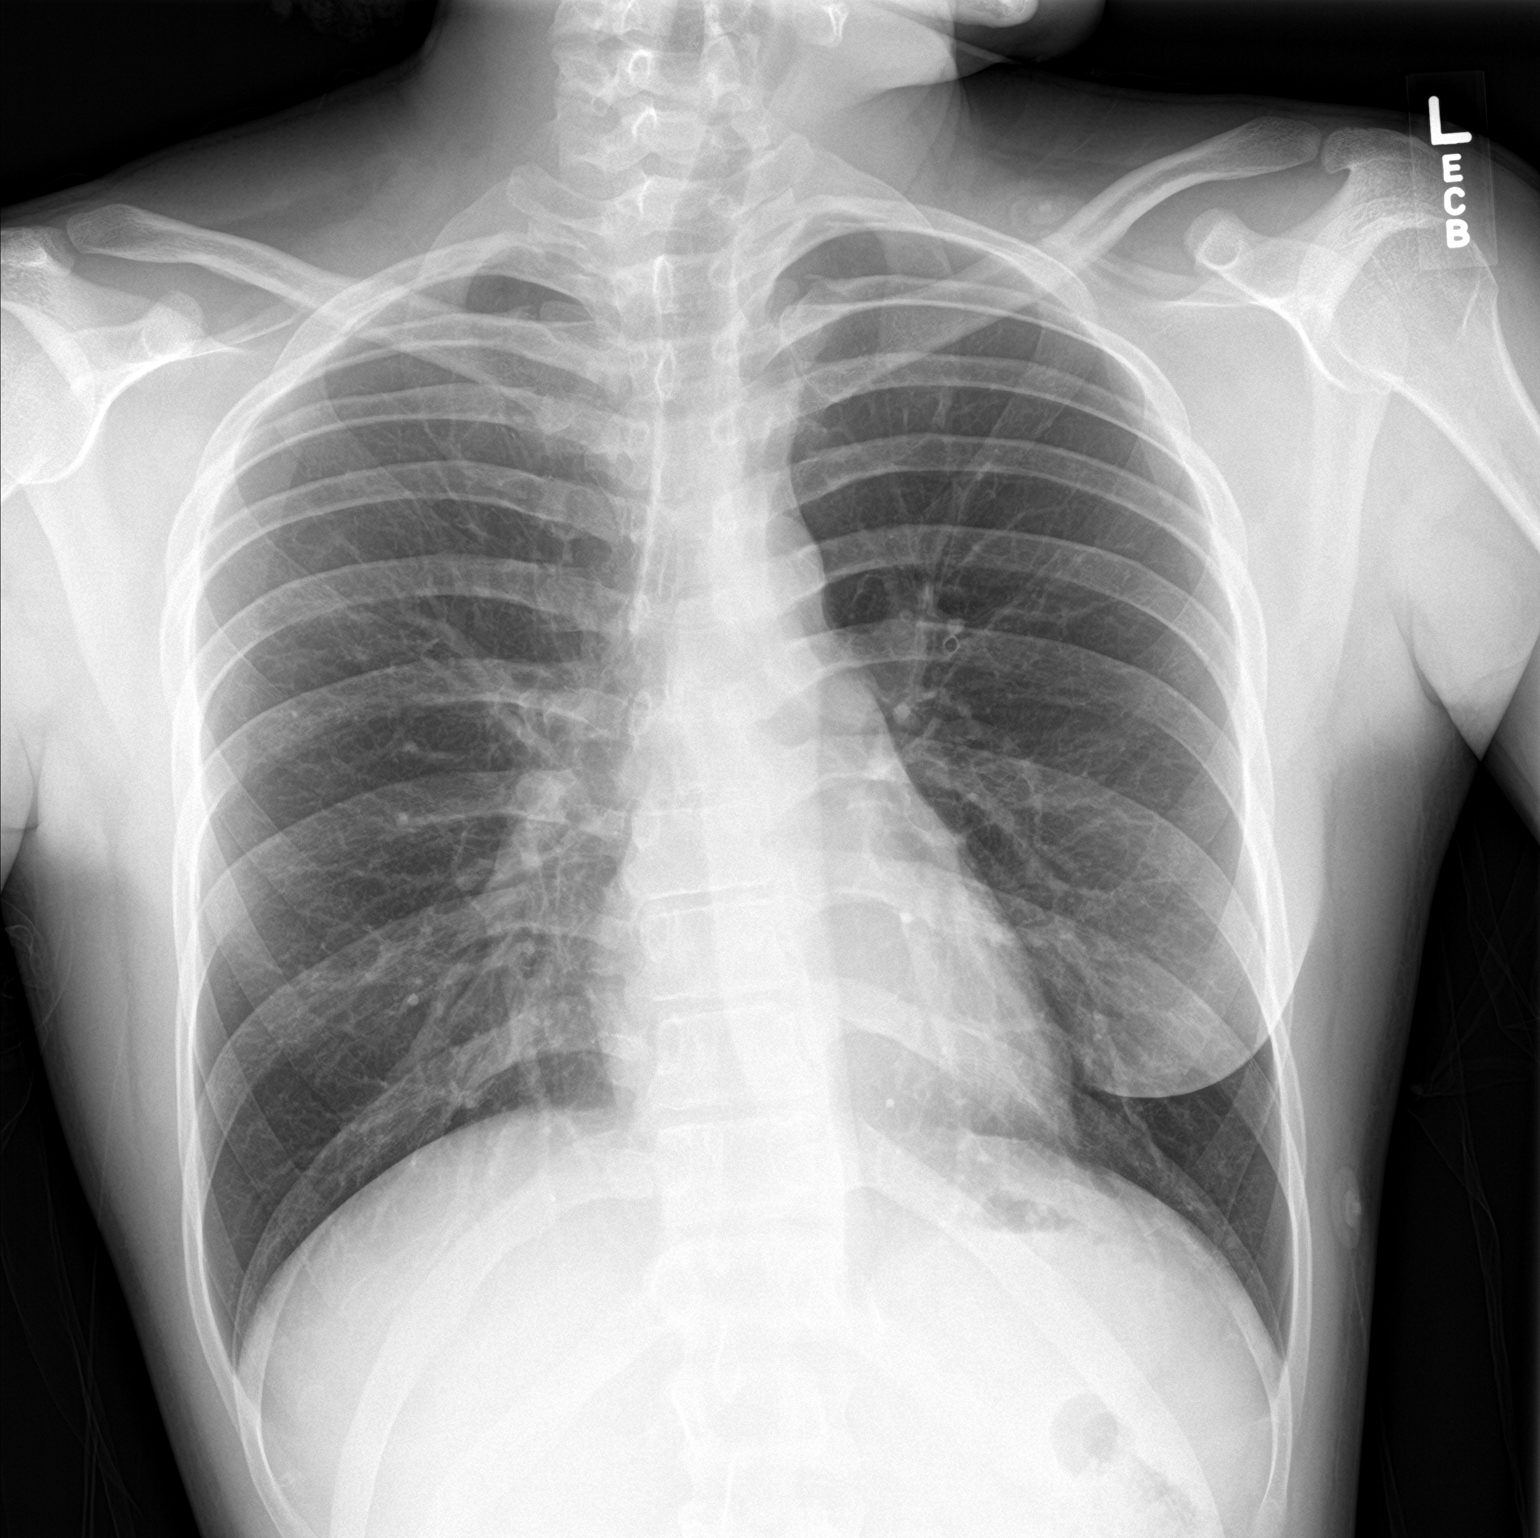

[chest lat]
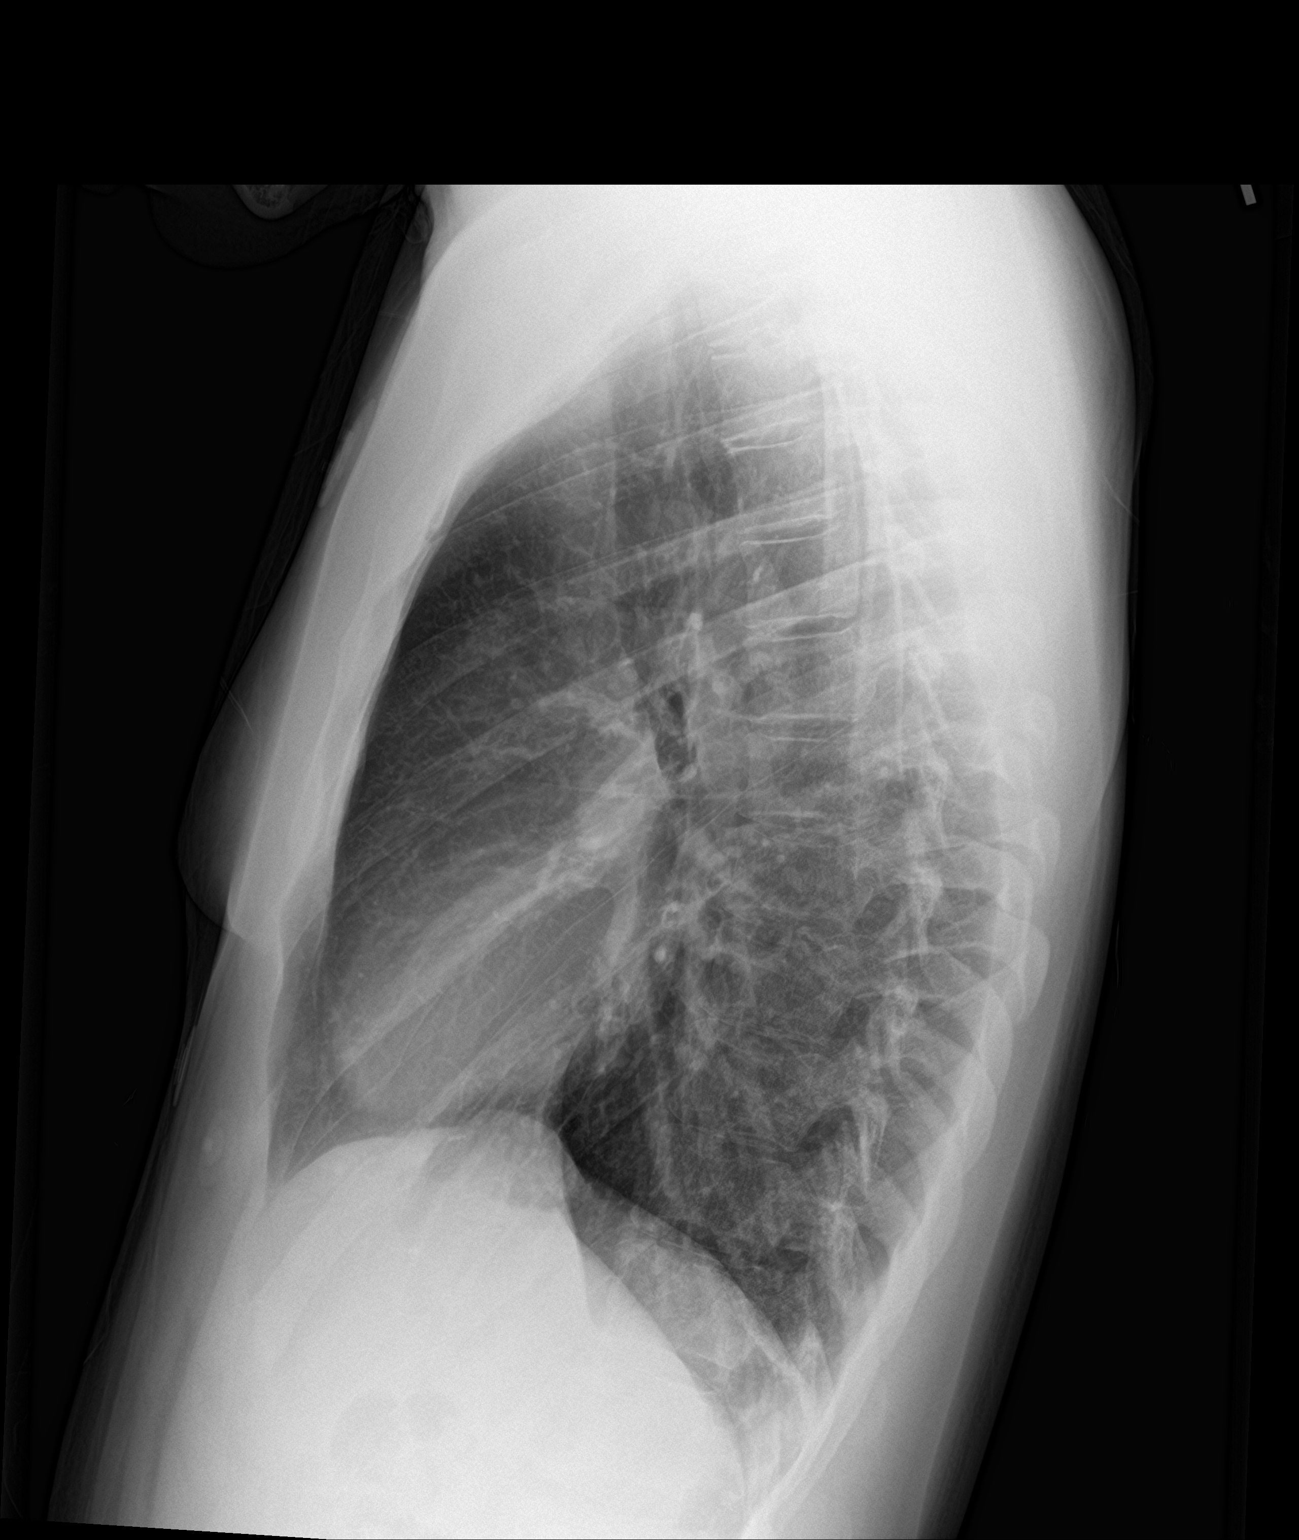

[2 of 2 positions shown; findings below may reference images not displayed]

FINDINGS: The heart size and mediastinal contours are within normal limits.
Both lungs are clear. No pleural effusion or pneumothorax. The
visualized skeletal structures are unremarkable.
IMPRESSION: Normal chest radiographs.

## 2020-10-17 ENCOUNTER — Emergency Department (HOSPITAL_COMMUNITY)
Admission: EM | Admit: 2020-10-17 | Discharge: 2020-10-18 | Discharge status: Against Medical Advice | Payer: Medicaid Other

## 2020-10-17 ENCOUNTER — Other Ambulatory Visit: Payer: Self-pay

## 2020-10-17 NOTE — ED Notes (Addendum)
Pt not answering for triage  

## 2022-02-08 ENCOUNTER — Ambulatory Visit (HOSPITAL_COMMUNITY)
Admission: EM | Admit: 2022-02-08 | Discharge: 2022-02-09 | Disposition: A | Discharge status: Home / Self Care | Payer: No Typology Code available for payment source | Attending: Psychiatry | Admitting: Psychiatry

## 2022-02-08 ENCOUNTER — Encounter (HOSPITAL_COMMUNITY): Payer: Self-pay | Admitting: Emergency Medicine

## 2022-02-08 DIAGNOSIS — F1020 Alcohol dependence, uncomplicated: Secondary | ICD-10-CM | POA: Diagnosis not present

## 2022-02-08 DIAGNOSIS — Z1152 Encounter for screening for COVID-19: Secondary | ICD-10-CM | POA: Insufficient documentation

## 2022-02-08 DIAGNOSIS — F4323 Adjustment disorder with mixed anxiety and depressed mood: Secondary | ICD-10-CM | POA: Insufficient documentation

## 2022-02-08 DIAGNOSIS — Z046 Encounter for general psychiatric examination, requested by authority: Secondary | ICD-10-CM

## 2022-02-08 DIAGNOSIS — F1010 Alcohol abuse, uncomplicated: Secondary | ICD-10-CM

## 2022-02-08 LAB — LIPID PANEL
Cholesterol: 185 mg/dL (ref 0–200)
HDL: 105 mg/dL (ref >40)
LDL Cholesterol: 69 mg/dL (ref 0–99)
Total CHOL/HDL Ratio: 1.8 RATIO
Triglycerides: 54 mg/dL (ref <150)
VLDL: 11 mg/dL (ref 0–40)

## 2022-02-08 LAB — CBC WITH DIFFERENTIAL/PLATELET
Abs Immature Granulocytes: 0.01 10*3/uL (ref 0.00–0.07)
Basophils Absolute: 0 10*3/uL (ref 0.0–0.1)
Basophils Relative: 0 %
Eosinophils Absolute: 0 10*3/uL (ref 0.0–0.5)
Eosinophils Relative: 0 %
HCT: 42.5 % (ref 39.0–52.0)
Hemoglobin: 14.2 g/dL (ref 13.0–17.0)
Immature Granulocytes: 0 %
Lymphocytes Relative: 24 %
Lymphs Abs: 1 10*3/uL (ref 0.7–4.0)
MCH: 29.8 pg (ref 26.0–34.0)
MCHC: 33.4 g/dL (ref 30.0–36.0)
MCV: 89.3 fL (ref 80.0–100.0)
Monocytes Absolute: 0.2 10*3/uL (ref 0.1–1.0)
Monocytes Relative: 5 %
Neutro Abs: 3 10*3/uL (ref 1.7–7.7)
Neutrophils Relative %: 71 %
Platelets: 185 10*3/uL (ref 150–400)
RBC: 4.76 MIL/uL (ref 4.22–5.81)
RDW: 13.5 % (ref 11.5–15.5)
WBC: 4.3 10*3/uL (ref 4.0–10.5)
nRBC: 0 % (ref 0.0–0.2)

## 2022-02-08 LAB — COMPREHENSIVE METABOLIC PANEL
ALT: 44 U/L (ref 0–44)
AST: 55 U/L — ABNORMAL HIGH (ref 15–41)
Albumin: 4.2 g/dL (ref 3.5–5.0)
Alkaline Phosphatase: 51 U/L (ref 38–126)
Anion gap: 18 — ABNORMAL HIGH (ref 5–15)
BUN: 8 mg/dL (ref 6–20)
CO2: 22 mmol/L (ref 22–32)
Calcium: 9.2 mg/dL (ref 8.9–10.3)
Chloride: 101 mmol/L (ref 98–111)
Creatinine, Ser: 0.86 mg/dL (ref 0.61–1.24)
GFR, Estimated: >60 mL/min (ref >60)
Glucose, Bld: 70 mg/dL (ref 70–99)
Potassium: 3.8 mmol/L (ref 3.5–5.1)
Sodium: 141 mmol/L (ref 135–145)
Total Bilirubin: 0.6 mg/dL (ref 0.3–1.2)
Total Protein: 8.5 g/dL — ABNORMAL HIGH (ref 6.5–8.1)

## 2022-02-08 LAB — RESP PANEL BY RT-PCR (RSV, FLU A&B, COVID)  RVPGX2
Influenza A by PCR: NEGATIVE
Influenza B by PCR: NEGATIVE
Resp Syncytial Virus by PCR: NEGATIVE
SARS Coronavirus 2 by RT PCR: NEGATIVE

## 2022-02-08 LAB — HEMOGLOBIN A1C
Hgb A1c MFr Bld: 5.1 % (ref 4.8–5.6)
Mean Plasma Glucose: 99.67 mg/dL

## 2022-02-08 LAB — TSH: TSH: 0.074 u[IU]/mL — ABNORMAL LOW (ref 0.350–4.500)

## 2022-02-08 LAB — MAGNESIUM: Magnesium: 1.9 mg/dL (ref 1.7–2.4)

## 2022-02-08 LAB — ETHANOL: Alcohol, Ethyl (B): 199 mg/dL — ABNORMAL HIGH (ref <10)

## 2022-02-08 LAB — HIV ANTIBODY (ROUTINE TESTING W REFLEX): HIV Screen 4th Generation wRfx: NONREACTIVE

## 2022-02-08 MED ORDER — ALUM & MAG HYDROXIDE-SIMETH 200-200-20 MG/5ML PO SUSP: 30.0000 mL | ORAL | Status: DC | PRN

## 2022-02-08 MED ORDER — ADULT MULTIVITAMIN W/MINERALS CH
1.0000 | ORAL_TABLET | Freq: Every day | ORAL | Status: DC
  Administered 2022-02-08 – 2022-02-09 (×2): 1 via ORAL
  Filled 2022-02-08 (×2): qty 1

## 2022-02-08 MED ORDER — OLANZAPINE 5 MG PO TBDP: 5.0000 mg | ORAL_TABLET | Freq: Three times a day (TID) | ORAL | Status: DC | PRN

## 2022-02-08 MED ORDER — HYDROXYZINE HCL 25 MG PO TABS: 25.0000 mg | ORAL_TABLET | Freq: Three times a day (TID) | ORAL | Status: DC | PRN

## 2022-02-08 MED ORDER — MAGNESIUM HYDROXIDE 400 MG/5ML PO SUSP: 30.0000 mL | Freq: Every day | ORAL | Status: DC | PRN

## 2022-02-08 MED ORDER — ZIPRASIDONE MESYLATE 20 MG IM SOLR: 20.0000 mg | INTRAMUSCULAR | Status: DC | PRN

## 2022-02-08 MED ORDER — ACETAMINOPHEN 325 MG PO TABS: 650.0000 mg | ORAL_TABLET | Freq: Four times a day (QID) | ORAL | Status: DC | PRN

## 2022-02-08 MED ORDER — ONDANSETRON 4 MG PO TBDP: 4.0000 mg | ORAL_TABLET | Freq: Four times a day (QID) | ORAL | Status: DC | PRN

## 2022-02-08 MED ORDER — HYDROXYZINE HCL 25 MG PO TABS
25.0000 mg | ORAL_TABLET | Freq: Four times a day (QID) | ORAL | Status: DC | PRN
  Administered 2022-02-08: 25 mg via ORAL
  Filled 2022-02-08: qty 1

## 2022-02-08 MED ORDER — LOPERAMIDE HCL 2 MG PO CAPS: 2.0000 mg | ORAL_CAPSULE | ORAL | Status: DC | PRN

## 2022-02-08 MED ORDER — THIAMINE HCL 100 MG/ML IJ SOLN
100.0000 mg | Freq: Once | INTRAMUSCULAR | Status: AC
  Administered 2022-02-08: 100 mg via INTRAMUSCULAR
  Filled 2022-02-08: qty 2

## 2022-02-08 MED ORDER — TRAZODONE HCL 50 MG PO TABS
50.0000 mg | ORAL_TABLET | Freq: Every evening | ORAL | Status: DC | PRN
  Administered 2022-02-08: 50 mg via ORAL
  Filled 2022-02-08: qty 1

## 2022-02-08 NOTE — ED Provider Notes (Signed)
[facility] Urgent Care Continuous Assessment Admission H&P  Date: 02/08/22 Patient Name: Thomas Vaughn MRN: [id number] Chief Complaint: I ant crazy and I got to get to work".  Diagnoses:  Final diagnoses:  Adjustment disorder with mixed anxiety and depressed mood  Involuntary commitment    HPI: patient presented to [facility] as a walk in via [employer] under IVC  with complaints of "I ant crazy and I got to get to work".  Thomas Vaughn, 32 y.o., male patient seen face to face by this provider and chart reviewed on 02/08/22.  Patient endorses 1 inpatient psychiatric admission in 2019 related to bipolar disorder, SI, and alcohol use disorder.  He disagrees with his bipolar diagnosis.  He currently has no outpatient psychiatric services in place and does not take any medications.   Patient petitioned by his spouse Boris Engelmann [phone].  IVC findings are as follows "respondent has been diagnosed with PTSD, anxiety, and depression.  He told friends and family that he is going to kill himself and his family.  Respondent told the neighbor that while the kids and wife is sleeping he stands up with them again in his and contemplating whether or not he wants to kill them.  He has kicked holes; destroying property.  Respondent drinks 2 pints of vodka and 4 Loco's.  Respondent has been hospitalized for alcoholism in the past".  On evaluation Thomas Vaughn adamantly denies any allegations made in the IVC petition.  He admits to binge drinking and states that is how he copes with stress.  He is most likely minimizing the amount of alcohol that he consumes.  He states today he only drank 1-40 ounce beer.  He drank that as the police were knocking on his door with the IVC he identifies his relationship with his spouse as his primary stressor.  States she is inconsistent in the relationship and at times moves to the spare bedroom.  She then sends mixed messages and comes back to the bedroom and sleeps with him.   He appears committed and is trying to save his marriage.  He is unsure why his spouse initiated the IVC.  He believes it is because she knows that he is hurting due to their relationship.  However he denies making any suicidal/homicidal statements in front of her or anyone.  He states, "why would I hurt the people that I care about most that is not who I am".  During evaluation Thomas Vaughn is observed sitting in the assessment room in no acute distress.  He is casually dressed and makes good eye contact.  He smells of alcohol.  He is alert/oriented x 4, calm, and cooperative.  He presents well considering he consumed alcohol before presenting.  His speech is clear, coherent, at a normal rate and tone.  He is endorsing depression related to his relationship.  He endorses feelings of not feeling good enough at times, decreased sleep, and decreased appetite.  He describes his mood is overall "good".  He smiles and jokes at times through out the assessment.  He appears logical and able to answer questions appropriately.  He is denying SI/HI/AVH.  He contracts for safety.  He denies having any access to firearms/weapons.  He does not appear psychotic or manic.  He does not appear to be responding to internal/external stimuli.  Discussed with patient that due to being unable to reach IVC petitioner that he would need to remain on the unit for overnight observation until  safety planning could be conducted.  He verbalized understanding and asked if there was any way that we could talk to his mother and possibly get collateral/safety information from her.  Patient is not interested in restarting any medications at this time.  He remained compliant and agreed to lab work, UDS, COVID testing, and EKG.  Of note patient states he was supposed to be at his job at 3 PM.  He was provided a phone to call his employer to let them know that he would not be at work today.  His employer states that he was actually terminated  last Friday for being a no-show.  She tells Vira Agar that she is willing to reconsider his employment if he would consider substance abuse treatment.  Call attempt to [facility] [phone]. Will attempt to contact in the am.   Call attempt to [place] [phone] multiple times . Call contact made at [place] to patient's mother.  She is surprised by the comments made in the IVC.  States Vira Agar would never say or do the things that were mentioned in the IVC.  The only way she could even comprehend that he would make those comments would be when he was drunk.  She has no concerns with his safety or the safety of his family.  States, "he loves his family he would never do anything to hurt them or himself".   Total Time spent with patient: 30 minutes  Musculoskeletal  Strength & Muscle Tone: within normal limits Gait & Station: normal Patient leans: N/A  Psychiatric Specialty Exam  Presentation General Appearance:  Casual  Eye Contact: Good  Speech: Clear and Coherent; Normal Rate  Speech Volume: Normal  Handedness: Right   Mood and Affect  Mood: Depressed; Anxious  Affect: Congruent   Thought Process  Thought Processes: Coherent  Descriptions of Associations:Intact  Orientation:Full (Time, Place and Person)  Thought Content:Logical    Hallucinations:Hallucinations: None  Ideas of Reference:None  Suicidal Thoughts:Suicidal Thoughts: No  Homicidal Thoughts:Homicidal Thoughts: No   Sensorium  Memory: Immediate Good; Remote Good; Recent Good  Judgment: Fair  Insight: Good   Executive Functions  Concentration: Good  Attention Span: Good  Recall: Good  Fund of Knowledge: Good  Language: Good   Psychomotor Activity  Psychomotor Activity: Psychomotor Activity: Normal   Assets  Assets: Communication Skills; Desire for Improvement; Financial Resources/Insurance; Physical Health; Resilience; Social  Support; Housing; Intimacy   Sleep  Sleep: Sleep: Fair Number of Hours of Sleep: 6   Nutritional Assessment (For OBS and FBC admissions only) Has the patient had a weight loss or gain of 10 pounds or more in the last 3 months?: No Has the patient had a decrease in food intake/or appetite?: Yes Does the patient have dental problems?: No Does the patient have eating habits or behaviors that may be indicators of an eating disorder including binging or inducing vomiting?: No Has the patient recently lost weight without trying?: 2.0 Has the patient been eating poorly because of a decreased appetite?: 1 Malnutrition Screening Tool Score: 3    Physical Exam Vitals and nursing note reviewed.  Constitutional:      General: He is not in acute distress.    Appearance: Normal appearance. He is well-developed.  HENT:     Head: Normocephalic and atraumatic.  Eyes:     General:        Right eye: No discharge.        Left eye: No discharge.  Cardiovascular:  Rate and Rhythm: Normal rate and regular rhythm.  Pulmonary:     Effort: Pulmonary effort is normal. No respiratory distress.  Musculoskeletal:        General: Normal range of motion.     Cervical back: Normal range of motion.  Skin:    Coloration: Skin is not jaundiced or pale.  Neurological:     Mental Status: He is alert and oriented to person, place, and time.  Psychiatric:        Attention and Perception: Attention and perception normal.        Mood and Affect: Mood is anxious and depressed. Affect is tearful.        Speech: Speech normal.        Behavior: Behavior normal. Behavior is cooperative.        Thought Content: Thought content normal.        Cognition and Memory: Cognition normal.        Judgment: Judgment normal.    Review of Systems  Constitutional: Negative.   HENT: Negative.    Eyes: Negative.   Respiratory: Negative.    Cardiovascular: Negative.   Musculoskeletal: Negative.   Skin: Negative.    Neurological: Negative.   Psychiatric/Behavioral:  Positive for depression and substance abuse. The patient is nervous/anxious.     Blood pressure (!) 135/100, pulse 88, temperature 98.7 F (37.1 C), temperature source Oral, resp. rate 17, height 5\' 7"  (1.702 m), weight 142 lb (64.4 kg), SpO2 90 %. Body mass index is 22.24 kg/m.  Past Psychiatric History: Bipolar disorder, depression, anxiety, SI, ADHD and AUD  Is the patient at risk to self? No  Has the patient been a risk to self in the past 6 months? No .    Has the patient been a risk to self within the distant past? Yes   Is the patient a risk to others? No   Has the patient been a risk to others in the past 6 months? No   Has the patient been a risk to others within the distant past? No   Past Medical History: hypokalemia, gynecomastia,   Family History: denies  Social History: lives with spouse, alcohol use   Last Labs:  No visits with results within 6 Month(s) from this visit.  Latest known visit with results is:  Admission on 04/15/2017, Discharged on 04/19/2017  Component Date Value Ref Range Status   Sodium 04/16/2017 142  135 - 145 mmol/L Final   Potassium 04/16/2017 3.5  3.5 - 5.1 mmol/L Final   Chloride 04/16/2017 102  101 - 111 mmol/L Final   CO2 04/16/2017 30  22 - 32 mmol/L Final   Glucose, Bld 04/16/2017 92  65 - 99 mg/dL Final   BUN 04/16/2017 12  6 - 20 mg/dL Final   Creatinine, Ser 04/16/2017 0.88  0.61 - 1.24 mg/dL Final   Calcium 04/16/2017 9.7  8.9 - 10.3 mg/dL Final   Total Protein 04/16/2017 8.0  6.5 - 8.1 g/dL Final   Albumin 04/16/2017 3.9  3.5 - 5.0 g/dL Final   AST 04/16/2017 37  15 - 41 U/L Final   ALT 04/16/2017 56  17 - 63 U/L Final   Alkaline Phosphatase 04/16/2017 52  38 - 126 U/L Final   Total Bilirubin 04/16/2017 0.8  0.3 - 1.2 mg/dL Final   GFR calc non Af Amer 04/16/2017 >60  >60 mL/min Final   GFR calc Af Amer 04/16/2017 >60  >60 mL/min Final   Comment: (NOTE) The  eGFR has been  calculated using the CKD EPI equation. This calculation has not been validated in all clinical situations. eGFR's persistently <60 mL/min signify possible Chronic Kidney Disease.    Anion gap 04/16/2017 10  5 - 15 Final   Performed at [facility], [place] [address], [place], [place] [address]   Hgb A1c MFr Bld 04/19/2017 5.2  4.8 - 5.6 % Final   Comment: (NOTE) Pre diabetes:          5.7%-6.4% Diabetes:              >6.4% Glycemic control for   <7.0% adults with diabetes    Mean Plasma Glucose 04/19/2017 102.54  mg/dL Final   Performed at [facility], [place] [address], [place], [place] [address]   Cholesterol 04/19/2017 155  0 - 200 mg/dL Final   Triglycerides 04/19/2017 137  <150 mg/dL Final   HDL 04/19/2017 82  >40 mg/dL Final   Total CHOL/HDL Ratio 04/19/2017 1.9  RATIO Final   VLDL 04/19/2017 27  0 - 40 mg/dL Final   LDL Cholesterol 04/19/2017 46  0 - 99 mg/dL Final   Comment:        Total Cholesterol/HDL:CHD Risk Coronary Heart Disease Risk Table                     Men   Women  1/2 Average Risk   3.4   3.3  Average Risk       5.0   4.4  2 X Average Risk   9.6   7.1  3 X Average Risk  23.4   11.0        Use the calculated Patient Ratio above and the CHD Risk Table to determine the patient's CHD Risk.        ATP III CLASSIFICATION (LDL):  <100     mg/dL   Optimal  100-129  mg/dL   Near or Above                    Optimal  130-159  mg/dL   Borderline  160-189  mg/dL   High  >190     mg/dL   Very High Performed at [place] [address], [place], [place] [address]     Allergies: Lorazepam  Medications:  PTA Medications  Medication Sig   gabapentin (NEURONTIN) 100 MG capsule Take 1 capsule (100 mg total) by mouth 3 (three) times daily. For withdrawal symptoms   pantoprazole (PROTONIX) 40 MG tablet Take 1 tablet (40 mg total) by mouth daily. For GERD   famotidine (PEPCID) 20 MG tablet Take 1 tablet (20 mg total) by  mouth 2 (two) times daily.   OLANZapine (ZYPREXA) 5 MG tablet Take 1 tablet (5 mg total) by mouth at bedtime. For mood control   sertraline (ZOLOFT) 50 MG tablet Take 1 tablet (50 mg total) by mouth daily. For mood control   traZODone (DESYREL) 100 MG tablet Take 1 tablet (100 mg total) by mouth at bedtime as needed for sleep.    Medical Decision Making  Patient presents to [facility] under involuntary commitment.  He will be admitted to the continuous assessment unit pending collateral contact and safety planning.  At that time disposition will be made.    Recommendations  Based on my evaluation the patient does not appear to have an emergency medical condition.  Patient presents to [facility] under involuntary commitment.  He will be admitted to the continuous assessment unit pending collateral contact and safety planning.  At that time disposition will be made.  CIWA protocol for AUD of note patient is allergic to lorazepam- no benzos ordered.  Patient declines to start any medications.   Lab Orders         Resp panel by RT-PCR (RSV, Flu A&B, Covid) Anterior Nasal Swab         CBC with Differential/Platelet         Comprehensive metabolic panel         Hemoglobin A1c         Magnesium         Ethanol         Lipid panel         TSH         RPR         HIV Antibody (routine testing w rflx)         POCT Urine Drug Screen - (I-Screen)      EKG  Revonda Humphrey, NP 02/08/22  4:04 PM
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
## 2022-02-08 NOTE — ED Notes (Signed)
Patient is IVCed. Patient is calm and cooperative. While waiting in the patient room he was banging on the door, and security was called. Later when we saw him he was calm and cooperative, mostly concerned about contacting his family so they wouod know where he was. There were no problems in drawing blood. Urine drug screen was negative for all substances. He states he has not been eating much lately "only drinking".There is a strong odor of alcohol on this patient. He stated that he had "been drinking since the morning, I guess I want to die... my wife wants a divorce." Patient became teary when speaking about his wife. Patient is currently taking a shower. He is on the CIWA protocol for scoring and can receive PRNs but NO BENZOS. Allergic to Lorazepam.Will continue to monitor for safety.

## 2022-02-08 NOTE — ED Notes (Signed)
Pt A&O x 4, resting at present, no distress noted, calm & cooperative.  Pt is IVC.  Monitoring for safety.

## 2022-02-08 NOTE — ED Notes (Signed)
Pt sleeping@this  time, breathing even and unlabored.will continue to monitor for safety

## 2022-02-09 DIAGNOSIS — F4323 Adjustment disorder with mixed anxiety and depressed mood: Secondary | ICD-10-CM | POA: Diagnosis not present

## 2022-02-09 LAB — GC/CHLAMYDIA PROBE AMP (~~LOC~~) NOT AT ARMC
Chlamydia: NEGATIVE
Comment: NEGATIVE
Comment: NORMAL
Neisseria Gonorrhea: NEGATIVE

## 2022-02-09 LAB — RPR: RPR Ser Ql: NONREACTIVE

## 2022-02-09 NOTE — ED Provider Notes (Signed)
FBC/OBS ASAP Discharge Summary  Date and Time: 02/09/2022 6:11 PM  Name: Thomas Vaughn  MRN:  [id number]   Discharge Diagnoses:  Final diagnoses:  Adjustment disorder with mixed anxiety and depressed mood  Involuntary commitment  Alcohol abuse   HPI: patient presented to [facility] as a walk in via [facility] under IVC  with complaints of "I ant crazy and I got to get to work".  He was admitted to the continuous assessment unit for overnight observation.   Mace Ethelda Chick, 32 y.o., male patient seen face to face by this provider, consulted with Dr. Dwyane Dee and chart reviewed on 02/09/22.  Patient endorses 1 inpatient psychiatric admission in 2019 related to bipolar disorder, SI, and alcohol use disorder.  He disagrees with his bipolar diagnosis.  He currently has no outpatient psychiatric services in place and does not take any medications.    Patient petitioned by his spouse Zimere Dunlevy [phone].  IVC findings are as follows "respondent has been diagnosed with PTSD, anxiety, and depression.  He told friends and family that he is going to kill himself and his family.  Respondent told the neighbor that while the kids and wife is sleeping he stands up with them again in his and contemplating whether or not he wants to kill them.  He has kicked holes; destroying property.  Respondent drinks 2 pints of vodka and 4 Loco's.  Respondent has been hospitalized for alcoholism in the past".  Subjective: On today's assessment patient is observed laying in his bed asleep.  He is easily awakened.  He is pleasant upon approach.  He is alert/oriented x 4, calm and cooperative.  He does admit to feeling anxious because he is ready to be discharged.  He was able to get some sleep while he was on the unit and states he does feel better this a.m.  Upon admission his EtOH was 199, patient did not provide a urine sample for UDS.  He continues to deny any of the allegations that were made in the IVC.  He states, "I know  why she did this she did this so she could move out when I was not home".  He is denying any SI/HI/AVH.  He identifies his family and his 7 children as his protective factors.  He states, "I would never do anything to jeopardize my relationship with my children".  He is forward thinking and states, "I am ready to put all this behind me and get back to work" he continues to deny any access to firearms/weapons.  He admits that his wife has firearms but they are not in the home.  He contracts for safety for himself and others.  He is able to answer questions appropriately.  He has goal-directed thoughts and has no distractibility.  He does not appear psychotic or manic.  Call contact from spouse Huzaifa Viney [phone].  Loma Sousa confirmed that she has moved out of the house and she is now living in [place] with her sister.  States she can no longer deal with patient's alcohol abuse.  She had no immediate safety concerns with patient returning home.  States she does own firearms and patient does not have access to them, firearms or with her.  She was very calm and expressed no concerns with patient being discharged and kept reverting back to patient's alcohol use throughout conversation.  When asked who ordered the family members and neighbors that per IVC patient has told that he was going to kill himself  and his family she was unable to recall who these people were.  Explained IVC would be rescinded and patient will be discharged.  Contact Antoinette [phone]  patient's mother: Discussed safety planning with patient and his mother.  Patient has agreed to be discharged to his mother and he agrees to stay with her for a few days before returning to his home.  Patient and mother are both in agreement.  Patient and his mother are both aware that Courtney/patient's spouse has moved out of the home.  Mother has no immediate safety concerns with patient being discharged.  Stay Summary: Patient remained  calm and cooperative while on the unit.  He was appropriate with staff and other patients.  He required no as needed medications for agitation.  He does not meet the criteria for inpatient psychiatric admission nor does he meet the criteria to uphold the involuntary commitment.  Patient was offered admission to the facility base crisis unit for alcohol use and he declined.  In addition he was given outpatient and residential substance abuse treatment resources.   Total Time spent with patient: 30 minutes  Past Psychiatric History: Bipolar disorder, depression, anxiety, SI, ADHD and AUD  Past Medical History: hypokalemia, gynecomastia,   Family History: denies  Family Psychiatric History: denies  Social History: lives with spouse. Alcohol use Tobacco Cessation:  N/A, patient does not currently use tobacco products  Current Medications:  No current facility-administered medications for this encounter.   No current outpatient medications on file.    PTA Medications:  Facility Ordered Medications  Medication   [COMPLETED] thiamine (VITAMIN B1) injection 100 mg        No data to display          [place] ED from 02/08/2022 in [place] No Risk       Musculoskeletal  Strength & Muscle Tone: within normal limits Gait & Station: normal Patient leans: N/A  Psychiatric Specialty Exam  Presentation  General Appearance:  Appropriate for Environment; Casual  Eye Contact: Good  Speech: Clear and Coherent; Normal Rate  Speech Volume: Normal  Handedness: Right   Mood and Affect  Mood: Anxious; Depressed  Affect: Congruent   Thought Process  Thought Processes: Coherent  Descriptions of Associations:Intact  Orientation:Full (Time, Place and Person)  Thought Content:Logical     Hallucinations:Hallucinations: None  Ideas of Reference:None  Suicidal Thoughts:Suicidal Thoughts: No  Homicidal  Thoughts:Homicidal Thoughts: No   Sensorium  Memory: Immediate Good; Remote Good; Recent Good  Judgment: Good  Insight: Good   Executive Functions  Concentration: Good  Attention Span: Good  Recall: Good  Fund of Knowledge: Good  Language: Good   Psychomotor Activity  Psychomotor Activity: Psychomotor Activity: Normal   Assets  Assets: Communication Skills; Desire for Improvement; Financial Resources/Insurance; Physical Health; Resilience; Social Support; Housing   Sleep  Sleep: Sleep: Good Number of Hours of Sleep: 6   Nutritional Assessment (For OBS and FBC admissions only) Has the patient had a weight loss or gain of 10 pounds or more in the last 3 months?: No Has the patient had a decrease in food intake/or appetite?: Yes Does the patient have dental problems?: No Does the patient have eating habits or behaviors that may be indicators of an eating disorder including binging or inducing vomiting?: No Has the patient recently lost weight without trying?: 2.0 Has the patient been eating poorly because of a decreased appetite?: 1 Malnutrition Screening Tool Score: 3  Physical Exam  Physical Exam Vitals and nursing note reviewed.  Constitutional:      General: He is not in acute distress.    Appearance: Normal appearance. He is well-developed.  HENT:     Head: Normocephalic and atraumatic.  Eyes:     General:        Right eye: No discharge.        Left eye: No discharge.  Cardiovascular:     Rate and Rhythm: Normal rate.     Heart sounds: No murmur heard. Pulmonary:     Effort: Pulmonary effort is normal. No respiratory distress.  Musculoskeletal:        General: No swelling.     Cervical back: Normal range of motion.  Skin:    Coloration: Skin is not jaundiced or pale.  Neurological:     Mental Status: He is alert and oriented to person, place, and time.  Psychiatric:        Attention and Perception: Attention and perception normal.         Mood and Affect: Mood is anxious and depressed.        Speech: Speech normal.        Behavior: Behavior normal. Behavior is cooperative.        Thought Content: Thought content normal.        Cognition and Memory: Cognition normal.        Judgment: Judgment normal.    Review of Systems  Constitutional: Negative.   HENT: Negative.    Eyes: Negative.   Respiratory: Negative.    Cardiovascular: Negative.   Gastrointestinal: Negative.   Musculoskeletal: Negative.   Skin: Negative.   Neurological: Negative.   Psychiatric/Behavioral:  Positive for depression. The patient is nervous/anxious.    Blood pressure 101/64, pulse 70, temperature 98.1 F (36.7 C), temperature source Oral, resp. rate 18, height 5\' 7"  (1.702 m), weight 142 lb (64.4 kg), SpO2 99 %. Body mass index is 22.24 kg/m.  Demographic Factors:  Male  Loss Factors: NA  Historical Factors: NA  Risk Reduction Factors:   Responsible for children under 13 years of age, Sense of responsibility to family, Religious beliefs about death, Employed, Living with another person, especially a relative, Positive social support, Positive therapeutic relationship, and Positive coping skills or problem solving skills  Continued Clinical Symptoms:  Depression:   Comorbid alcohol abuse/dependence Alcohol/Substance Abuse/Dependencies  Cognitive Features That Contribute To Risk:  None    Suicide Risk:  Minimal: No identifiable suicidal ideation.  Patients presenting with no risk factors but with morbid ruminations; may be classified as minimal risk based on the severity of the depressive symptoms  Plan Of Care/Follow-up recommendations:  Activity:  as tolerated  Diet:  regular   Disposition: Discharge patient  Rescind IVC  Outpatient psychiatric resources provided for medication management and therapy.  In addition outpatient resources were provided for residential and outpatient substance abuse treatment.  Revonda Humphrey, NP 02/09/2022, 6:11 PM

## 2022-02-09 NOTE — ED Notes (Signed)
Discharge instructions provided and Pt stated understanding. Pt alert, orient and ambulatory prior to d/c from facility. Personal belongings returned from locker number 11. Patient escorted to the lobby to meet his mom to d/c from facility. Safety maintained.

## 2022-02-09 NOTE — ED Notes (Signed)
Pt sleeping@this  time. Breathing even and unlabored. Will continue to monitor fore safety

## 2022-02-09 NOTE — Discharge Instructions (Addendum)
Substance Abuse Treatment Programs ° °Intensive Outpatient Programs °[facility]     °[address]      °[place], [place]                   °[phone]      ° °[facility] °[address] °[place], [place] °[phone] ° °[facility]     °(Inpatient and outpatient)     °[address]           °[phone]   ° °[facility] °[phone] (Suboxone and Methadone) ° °[address]      °[place], [place] [address]      °[phone]      ° °[address] °[place], [place] °852-3033 ° °[facility] (Outpatient/Inpatient, [facility])    °(insurance only) [phone]      °       °[facility] (Groups & Residential) °[place], [place] °[phone] ° °   °[facility]     °[address]     °[place], [place]      °[phone]      ° °[facility] (for caregivers and family) °[address] °[place], [place] °336-299-4655 ° ° ° ° ° °Residential Treatment Programs °[facility]      °[address], [place], [place] [address]  °[phone]      ° °[facility] °[address] [place], [place] [address] °[phone] ° °[facility]        °[phone]      ° °[facility] °1-800-659-3381 ° °[facility]             °[address]                                         °[place], [place]                                                °877-615-2722 or [phone]                              ° °[facility] [place] °[address] °[place], [address] °1.877.941.8954 ° °[facility]    °[address]      °[place], [place]     °[phone]      ° °[facility] °[address] °[place], [place] °336-285-9073 ° °[facility]   °[address]     °[place], [place] [address]     °[phone]      °Admissions: 8am-3pm M-F ° °[facility] °[address] °[place],  [place] °336-227-7417 ° °[facility]: Residential Program (90 Days)   °[place], [place]      °336-725-8389 or 800-758-6077    ° °[facility] °[place], [place] °(Walk in Hours over the weekend or by referral) ° °[facility] °[address], [place],  [address] °([phone]) [phone] ° °Crisis Mobile: [facility]:  1-877-626-1772 (for crisis response 24 hours a day) °[facility] Hotline:      1-800-256-2452 °Outpatient Psychiatry and Counseling ° °[facility]: Mobile Crisis   Management 24 hours:  1-877-626-1772 ° °[facility] sliding scale fee and walk in schedule: M-F 8am-12pm/1pm-3pm °[address]  °[place], [place] [address] °[phone] ° °[facility] °[address] °[place], [place] [address] °336-703-9650 ° °[facility] ([facility])- new patient walk-in appointments available Monday - Friday 8am -3pm.          °[address] °[place], [place] [address] °336-676-6840 or crisis line- 336-676-6905 ° °[facility] Intensive Outpatient Therapy Program °[address] °[place], [place] [address] °336-832-9804 ° °[facility]                  °Crisis Services      °[phone]      °[address]     °[place], [place] [address]                ° °[facility]   °[facility] °[phone] °[address] °[place], [place] [address] ° ° °[facility]          °[address],  °[place], [place] [address]       °([phone] ° °[facility] °[address] °[place], [place] [address] °336-292-1510 ° °[facility]    °[address]    °[place], [place] [address]     °336-632-3505      ° °Parish McKinney, MD     °[address]     °[place] [address]     °336-282-1251     °  °[facility] °[address] °[place] [address] ° °[facility]     °[address]     °[place], [place]      °336-542-2076      ° °[facility] °Shamsher Ahluwalia, MD °[address] °[place], [place] [address] °336-420-9558 ° °[facility]     °[address]     °[place], [place] [address]     °336-274-1237      ° °[facility] °[address] °[place], [place] [address] °336-854-4450 °Resources for Temporary Residential Assistance/Crisis Centers ° °DAY CENTERS °[facility] °M-F 8am-3pm   °[address] [place], [place] [address]   336-332-0824 °Services include: laundry, barbering, support groups, case management, phone  & computer access, showers, AA/NA mtgs, mental health/substance abuse nurse, job skills class, disability information, VA assistance, spiritual classes, etc.  ° °HOMELESS SHELTERS ° °[facility]     °[facility]   °[address], [place]     °336.271.5959       °       °[facility] (women and children)       °[address] °[place], [place] [address] °336-275-0820 °[email or web address] for application and process °Application Required ° °[facility]   °[address]    °[place] [address]     °[phone]       °             °[facility] °[address] °[place], [place] [address] °336.273.5572 °336-235-0363(schedule application appt.) °Application Required ° °[facility] (women only)    °[address]     °[place],  [address]     °336-884-1039      °  Intake starts 6pm daily Need valid ID, SSC, & Police report [facility] [address] [place], [place] [phone] Application Required  [employer] [employer] (men only)     [place].      [place], [place]       [facility] (Pregnant women only) [address] [place], [place]  [facility]      [place] [address].      [place], [place] [address]     [place] [address] [place], [place] 90 day commitment/SA/Application process  [facility](men only)     [address]     [place], [place]       Check-in at [facility] [address] [place], [place] [address] [phone] Men/Women/Women and Children must be there by 7 pm  [place], [place] will reach out to his mother , call 911 or call mobile crisis, or go to nearest emergency room if condition worsens or if suicidal thoughts become active Patients' will follow up with [place] services on the 2cd floor for outpatient psychiatric services (therapy/medication management).  The suicide prevention education provided includes the following: Suicide risk factors Suicide prevention and interventions National Suicide Hotline telephone number [facility] assessment telephone number [place] and/or Residential Mobile Crisis Unit telephone number Request made of family/significant other to:  mom and spouse  Remove weapons (e.g., guns, rifles, knives), all items previously/currently identified as safety concern.   Remove drugs/medications (over the counter, prescriptions, illicit drugs), all items previously/currently identified as a safety concern.

## 2022-02-13 ENCOUNTER — Ambulatory Visit (HOSPITAL_COMMUNITY)
Admission: EM | Admit: 2022-02-13 | Discharge: 2022-02-13 | Disposition: A | Discharge status: Home / Self Care | Payer: Medicaid Other

## 2022-02-13 DIAGNOSIS — F1020 Alcohol dependence, uncomplicated: Secondary | ICD-10-CM

## 2022-02-13 DIAGNOSIS — F1010 Alcohol abuse, uncomplicated: Secondary | ICD-10-CM

## 2022-02-13 NOTE — Discharge Instructions (Signed)
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
## 2022-02-13 NOTE — Progress Notes (Signed)
   02/13/22 0010  [place] Triage Screening (Walk-ins at [facility] only)  How Did You Hear About [place]? Family/Friend  What Is the Reason for Your Visit/Call Today? Pt is a 32 year old married male who was dropped off by a friend who encouraged Pt to seek help for his alcohol use. Pt was petitioned for IVC by his wife on 02/08/2022 and evaluated overnight at [facility] He was offered treatment for alcohol use but declined and was discharged in 24 hours. He says while he was at [facility] his wife packed up all their belongings and left with the children. He recently was fired from his job for not showing up for work. He describes feeling very depressed. He denies current suicidal ideation, homicidal ideation, or psychotic symptoms. He states he drinks anywhere from one beer to a fifth of liquor and has been drinking more heavily since his wife left. He denies use of substances other than alcohol. He says he wants to spend one night with no alcohol and be given medication for withdrawal "and then I will leave and never drink again."  How Long Has This Been Causing You Problems? 1-6 months  Have You Recently Had Any Thoughts About Hurting Yourself? No  Are You Planning to Commit Suicide/Harm Yourself At This time? No  Have you Recently Had Thoughts About [place]? No  Are You Planning To Harm Someone At This Time? No  Are you currently experiencing any auditory, visual or other hallucinations? No  Have You Used Any Alcohol or Drugs in the Past 24 Hours? Yes  How long ago did you use Drugs or Alcohol? Pt reports drinking alcohol prior to coming to [facility].  What Did You Use and How Much? Approximately 720 ml of vodka  Do you have any current medical co-morbidities that require immediate attention? No  Clinician description of patient physical appearance/behavior: Pt is casually dressed, alert and oriented x4. Pt speaks in a clear tone, at moderate volume and normal pace. Motor behavior appears normal. Eye contact is  fair and he is tearful. Pt's mood is depressed and affect is congruent with mood. Thought process is coherent and relevant. There is no indication he is currently responding to internal stimuli or experiencing delusional thought content.  What Do You Feel Would Help You the Most Today? Alcohol or Drug Use Treatment  If access to [facility] was not available, would you have sought care in the Emergency Department? No  Determination of Need Routine (7 days)  Options For Referral Facility-Based Crisis;[facility] Urgent Care

## 2022-03-16 ENCOUNTER — Encounter (HOSPITAL_COMMUNITY): Payer: Self-pay | Admitting: *Deleted

## 2022-03-16 ENCOUNTER — Ambulatory Visit (HOSPITAL_COMMUNITY)
Admission: EM | Admit: 2022-03-16 | Discharge: 2022-03-16 | Disposition: A | Discharge status: Home / Self Care | Payer: Medicaid Other | Attending: Emergency Medicine | Admitting: Emergency Medicine

## 2022-03-16 ENCOUNTER — Other Ambulatory Visit: Payer: Self-pay

## 2022-03-16 DIAGNOSIS — F1010 Alcohol abuse, uncomplicated: Secondary | ICD-10-CM

## 2022-03-16 DIAGNOSIS — F1011 Alcohol abuse, in remission: Secondary | ICD-10-CM | POA: Insufficient documentation

## 2022-03-16 DIAGNOSIS — R197 Diarrhea, unspecified: Secondary | ICD-10-CM | POA: Diagnosis not present

## 2022-03-16 DIAGNOSIS — Z113 Encounter for screening for infections with a predominantly sexual mode of transmission: Secondary | ICD-10-CM

## 2022-03-16 DIAGNOSIS — R1011 Right upper quadrant pain: Secondary | ICD-10-CM | POA: Diagnosis not present

## 2022-03-16 DIAGNOSIS — G8929 Other chronic pain: Secondary | ICD-10-CM

## 2022-03-16 DIAGNOSIS — Z202 Contact with and (suspected) exposure to infections with a predominantly sexual mode of transmission: Secondary | ICD-10-CM | POA: Diagnosis not present

## 2022-03-16 LAB — COMPREHENSIVE METABOLIC PANEL
ALT: 76 U/L — ABNORMAL HIGH (ref 0–44)
AST: 124 U/L — ABNORMAL HIGH (ref 15–41)
Albumin: 3.8 g/dL (ref 3.5–5.0)
Alkaline Phosphatase: 64 U/L (ref 38–126)
Anion gap: 10 (ref 5–15)
BUN: 10 mg/dL (ref 6–20)
CO2: 27 mmol/L (ref 22–32)
Calcium: 9.7 mg/dL (ref 8.9–10.3)
Chloride: 103 mmol/L (ref 98–111)
Creatinine, Ser: 0.85 mg/dL (ref 0.61–1.24)
GFR, Estimated: >60 mL/min (ref >60)
Glucose, Bld: 84 mg/dL (ref 70–99)
Potassium: 3.9 mmol/L (ref 3.5–5.1)
Sodium: 140 mmol/L (ref 135–145)
Total Bilirubin: 0.6 mg/dL (ref 0.3–1.2)
Total Protein: 7.9 g/dL (ref 6.5–8.1)

## 2022-03-16 LAB — HIV ANTIBODY (ROUTINE TESTING W REFLEX): HIV Screen 4th Generation wRfx: NONREACTIVE

## 2022-03-16 LAB — CBC WITH DIFFERENTIAL/PLATELET
Abs Immature Granulocytes: 0.01 10*3/uL (ref 0.00–0.07)
Basophils Absolute: 0 10*3/uL (ref 0.0–0.1)
Basophils Relative: 0 %
Eosinophils Absolute: 0 10*3/uL (ref 0.0–0.5)
Eosinophils Relative: 1 %
HCT: 43.5 % (ref 39.0–52.0)
Hemoglobin: 14.9 g/dL (ref 13.0–17.0)
Immature Granulocytes: 0 %
Lymphocytes Relative: 33 %
Lymphs Abs: 1.1 10*3/uL (ref 0.7–4.0)
MCH: 31.2 pg (ref 26.0–34.0)
MCHC: 34.3 g/dL (ref 30.0–36.0)
MCV: 91 fL (ref 80.0–100.0)
Monocytes Absolute: 0.4 10*3/uL (ref 0.1–1.0)
Monocytes Relative: 11 %
Neutro Abs: 1.9 10*3/uL (ref 1.7–7.7)
Neutrophils Relative %: 55 %
Platelets: 107 10*3/uL — ABNORMAL LOW (ref 150–400)
RBC: 4.78 MIL/uL (ref 4.22–5.81)
RDW: 14.3 % (ref 11.5–15.5)
WBC: 3.4 10*3/uL — ABNORMAL LOW (ref 4.0–10.5)
nRBC: 0 % (ref 0.0–0.2)

## 2022-03-16 LAB — LIPASE, BLOOD: Lipase: 43 U/L (ref 11–51)

## 2022-03-16 MED ORDER — METRONIDAZOLE 500 MG PO TABS
2000.0000 mg | ORAL_TABLET | Freq: Once | ORAL | Status: AC
  Administered 2022-03-16: 2000 mg via ORAL

## 2022-03-16 MED ORDER — METRONIDAZOLE 500 MG PO TABS
ORAL_TABLET | ORAL | Status: AC
  Filled 2022-03-16: qty 4

## 2022-03-16 NOTE — Discharge Instructions (Signed)
During your visit today, blood tests were ordered to evaluate you for possible pancreatitis and gallbladder inflammation.  The results of this test will be available to your MyChart in the next 48 to 72 hours.  If there are any abnormal findings, you will be contacted by phone with further recommendations, if any.  Based on the symptoms and concerns you shared with me today, you were treated for presumed trichomonas with a single dose of metronidazole 2 g, please take all four 500 mg tablets at once.  Please abstain from sexual intercourse of any kind, vaginal, oral or anal, for 7 days.   Based on the symptoms and concerns you shared with me today, you were provided with a prescription of doxycycline for treatment of for presumed chlamydia due to your known exposure.  Please take 1 tablet twice daily for the next 7 days.  Please take all tablets as prescribed, do not skip doses.  Failure to take all doses as prescribed can result in a worsening infection that will be more difficult to treat and resolve.  Please abstain from sexual intercourse of any kind, vaginal, oral or anal, for 7 days.   If you have not had complete resolution of your symptoms after completing any recommended treatment or if your symptoms worsen, please return for repeat evaluation.   Thank you for visiting urgent care today.  I appreciate the opportunity to participate in your care.

## 2022-03-16 NOTE — ED Provider Notes (Addendum)
[place]    CSN: [id number] Arrival date & time: 03/16/22  1532    HISTORY   Chief Complaint  Patient presents with   SEXUALLY TRANSMITTED DISEASE   HPI Thomas Vaughn is a pleasant, 32 y.o. male who presents to urgent care today. Pt c/o abdominal pain and diarrhea since quitting alcohol cold [place] 2 days ago.  Patient also has significantly elevated blood pressure on arrival today, states he does not have a history of hypertension.  Patient also reports known exposure to chlamydia and trichomonas, is requesting treatment today.  Patient is also requesting liver function testing due to having diarrhea, abdominal pain and night sweats for the past 48 hours.  The history is provided by the patient.   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 No pertinent surgical history.  Home Medications    Prior to Admission medications   Not on File    Family History Family History  Problem Relation Age of Onset   Heart failure Mother    Social History Social History   Tobacco Use   Smoking status: Some Days    Types: Cigarettes   Smokeless tobacco: Never  Vaping Use   Vaping Use: Never used  Substance Use Topics   Alcohol use: Yes    Alcohol/week: 7.0 standard drinks of alcohol    Types: 6 Cans of beer, 1 Shots of liquor per week    Comment: twice a week   Drug use: No   Allergies   Lorazepam and [place]  Review of Systems Review of Systems Pertinent findings revealed after performing a 14 point review of systems has been noted in the  history of present illness.  Physical Exam Vital Signs BP (!) 155/104   Pulse 63   Temp (!) 97.4 F (36.3 C)   Resp 18   SpO2 99%   No data found.  Physical Exam Vitals and nursing note reviewed.  Constitutional:      General: He is not in acute distress.    Appearance: Normal appearance. He is normal weight. He is not ill-appearing.  HENT:     Head: Normocephalic and atraumatic.  Eyes:     General: Lids are normal.        Right eye: No discharge.        Left eye: No discharge.     Extraocular Movements: Extraocular movements intact.     Conjunctiva/sclera: Conjunctivae normal.     Right eye: Right conjunctiva is not injected.     Left eye: Left conjunctiva is not injected.     Pupils: Pupils are equal, round, and reactive to light.  Neck:     Trachea: Trachea and phonation normal.  Cardiovascular:     Rate and Rhythm: Normal rate and regular rhythm.     Pulses: Normal pulses.     Heart sounds: Normal heart sounds. No murmur heard.    No friction rub. No gallop.  Pulmonary:     Effort: Pulmonary effort is normal. No accessory muscle usage, prolonged expiration or respiratory distress.  Breath sounds: Normal breath sounds. No stridor, decreased air movement or transmitted upper airway sounds. No decreased breath sounds, wheezing, rhonchi or rales.  Chest:     Chest wall: No tenderness.  Abdominal:     General: Abdomen is flat. Bowel sounds are normal.     Palpations: Abdomen is soft.     Tenderness: There is no abdominal tenderness.  Genitourinary:    Comments: Pt politely declines GU exam, pt did provide a penile swab for testing.   Musculoskeletal:        General: Normal range of motion.     Cervical back: Normal range of motion and neck supple. Normal range of motion.  Lymphadenopathy:     Cervical: No cervical adenopathy.  Skin:    General: Skin is warm and dry.     Findings: No erythema or rash.  Neurological:     General: No focal deficit present.      Mental Status: He is alert and oriented to person, place, and time. Mental status is at baseline.  Psychiatric:        Mood and Affect: Mood normal.        Behavior: Behavior normal.        Thought Content: Thought content normal.        Judgment: Judgment normal.     Visual Acuity Right Eye Distance:   Left Eye Distance:   Bilateral Distance:    Right Eye Near:   Left Eye Near:    Bilateral Near:     UC Couse / Diagnostics / Procedures:     Radiology No results found.  Procedures Procedures (including critical care time) EKG  Pending results:  Labs Reviewed  CBC WITH DIFFERENTIAL/PLATELET  COMPREHENSIVE METABOLIC PANEL  LIPASE, BLOOD  RPR  HIV ANTIBODY (ROUTINE TESTING W REFLEX)  CYTOLOGY, (ORAL, ANAL, URETHRAL) ANCILLARY ONLY    Medications Ordered in UC: Medications  metroNIDAZOLE (FLAGYL) tablet 2,000 mg (has no administration in time range)    UC Diagnoses / Final Clinical Impressions(s)   I have reviewed the triage vital signs and the nursing notes.  Pertinent labs & imaging results that were available during my care of the patient were reviewed by me and considered in my medical decision making (see chart for details).    Final diagnoses:  Screening examination for STD (sexually transmitted disease)  Exposure to chlamydia  Exposure to trichomonas  Abdominal pain, chronic, right upper quadrant  Diarrhea, unspecified type  History of alcohol abuse   Patient was provided with Metronidazole 2 g as a single dose for empiric treatment of presumed trichomonas based on the history provided to me today.   Patient will be provided with Doxycycline 100 mg twice daily for 7 days for treatment of presumed chlamydia based on the result of his STD screening.   STD screening was performed, patient advised that the results be posted to their MyChart and if any of the results are positive, they will be notified by phone, further treatment will be provided as indicated  based on results of STD screening. CMP, lipase and CBC obtained to rule out pancreatitis given patient's history of alcohol abuse.  Patient commended for his decision to quit drinking alcohol and advised to follow-up with PCP for further monitoring and support.  Will notify patient of results once received.  Return precautions advised.  Drug allergies reviewed, all questions addressed.   Please see discharge instructions below for details of plan of care as provided to patient. ED Prescriptions  None    PDMP not reviewed this encounter.  Disposition Upon Discharge:  Condition: stable for discharge home  Patient presented with concern for an acute illness with associated systemic symptoms and significant discomfort requiring urgent management. In my opinion, this is a condition that a prudent lay person (someone who possesses an average knowledge of health and medicine) may potentially expect to result in complications if not addressed urgently such as respiratory distress, impairment of bodily function or dysfunction of bodily organs.   As such, the patient has been evaluated and assessed, work-up was performed and treatment was provided in alignment with urgent care protocols and evidence based medicine.  Patient/parent/caregiver has been advised that the patient may require follow up for further testing and/or treatment if the symptoms continue in spite of treatment, as clinically indicated and appropriate.  Routine symptom specific, illness specific and/or disease specific instructions were discussed with the patient and/or caregiver at length.  Prevention strategies for avoiding STD exposure were also discussed.  The patient will follow up with their current PCP if and as advised. If the patient does not currently have a PCP we will assist them in obtaining one.   The patient may need specialty follow up if the symptoms continue, in spite of conservative treatment and management, for  further workup, evaluation, consultation and treatment as clinically indicated and appropriate.  Patient/parent/caregiver verbalized understanding and agreement of plan as discussed.  All questions were addressed during visit.  Please see discharge instructions below for further details of plan.  Discharge Instructions:   Discharge Instructions      During your visit today, blood tests were ordered to evaluate you for possible pancreatitis and gallbladder inflammation.  The results of this test will be available to your MyChart in the next 48 to 72 hours.  If there are any abnormal findings, you will be contacted by phone with further recommendations, if any.  Based on the symptoms and concerns you shared with me today, you were treated for presumed trichomonas with a single dose of metronidazole 2 g, please take all four 500 mg tablets at once.  Please abstain from sexual intercourse of any kind, vaginal, oral or anal, for 7 days.   Based on the symptoms and concerns you shared with me today, you were provided with a prescription of doxycycline for treatment of for presumed chlamydia due to your known exposure.  Please take 1 tablet twice daily for the next 7 days.  Please take all tablets as prescribed, do not skip doses.  Failure to take all doses as prescribed can result in a worsening infection that will be more difficult to treat and resolve.  Please abstain from sexual intercourse of any kind, vaginal, oral or anal, for 7 days.   If you have not had complete resolution of your symptoms after completing any recommended treatment or if your symptoms worsen, please return for repeat evaluation.   Thank you for visiting urgent care today.  I appreciate the opportunity to participate in your care.       This office note has been dictated using Museum/gallery curator.  Unfortunately, this method of dictation can sometimes lead to typographical or grammatical errors.  I apologize  for your inconvenience in advance if this occurs.  Please do not hesitate to reach out to me if clarification is needed.       Lynden Oxford Scales, PA-C 03/17/22 0910    Lynden Oxford Scales, PA-C 03/22/22 [phone]

## 2022-03-16 NOTE — ED Triage Notes (Signed)
Pt wants to be tested for std. Pt repot ABD pain and sweats.

## 2022-03-16 NOTE — ED Triage Notes (Signed)
Pt wants his liver enzymes checked because of hx of heavy alcohol intake. Pt reports he went cold [place] 2 days ago.Pt BP 155/104.

## 2022-03-17 LAB — RPR: RPR Ser Ql: NONREACTIVE

## 2022-03-18 LAB — CYTOLOGY, (ORAL, ANAL, URETHRAL) ANCILLARY ONLY
Chlamydia: NEGATIVE
Comment: NEGATIVE
Comment: NEGATIVE
Comment: NORMAL
Neisseria Gonorrhea: NEGATIVE
Trichomonas: NEGATIVE

## 2022-03-19 ENCOUNTER — Ambulatory Visit (HOSPITAL_COMMUNITY): Payer: Medicaid Other

## 2023-04-27 ENCOUNTER — Other Ambulatory Visit (HOSPITAL_COMMUNITY)
Admission: EM | Admit: 2023-04-27 | Discharge: 2023-04-30 | Disposition: A | Discharge status: Home / Self Care | Attending: Psychiatry | Admitting: Psychiatry

## 2023-04-27 ENCOUNTER — Ambulatory Visit (HOSPITAL_COMMUNITY)
Admission: EM | Admit: 2023-04-27 | Discharge: 2023-04-27 | Disposition: A | Discharge status: Home / Self Care | Attending: Behavioral Health | Admitting: Behavioral Health

## 2023-04-27 DIAGNOSIS — Y908 Blood alcohol level of 240 mg/100 ml or more: Secondary | ICD-10-CM | POA: Insufficient documentation

## 2023-04-27 DIAGNOSIS — F10150 Alcohol abuse with alcohol-induced psychotic disorder with delusions: Secondary | ICD-10-CM | POA: Insufficient documentation

## 2023-04-27 DIAGNOSIS — L309 Dermatitis, unspecified: Secondary | ICD-10-CM | POA: Insufficient documentation

## 2023-04-27 DIAGNOSIS — Z560 Unemployment, unspecified: Secondary | ICD-10-CM | POA: Insufficient documentation

## 2023-04-27 DIAGNOSIS — F1090 Alcohol use, unspecified, uncomplicated: Secondary | ICD-10-CM

## 2023-04-27 DIAGNOSIS — F331 Major depressive disorder, recurrent, moderate: Secondary | ICD-10-CM | POA: Insufficient documentation

## 2023-04-27 DIAGNOSIS — F17290 Nicotine dependence, other tobacco product, uncomplicated: Secondary | ICD-10-CM | POA: Insufficient documentation

## 2023-04-27 DIAGNOSIS — Z79899 Other long term (current) drug therapy: Secondary | ICD-10-CM | POA: Insufficient documentation

## 2023-04-27 DIAGNOSIS — F411 Generalized anxiety disorder: Secondary | ICD-10-CM | POA: Diagnosis not present

## 2023-04-27 DIAGNOSIS — F22 Delusional disorders: Secondary | ICD-10-CM | POA: Insufficient documentation

## 2023-04-27 DIAGNOSIS — F10129 Alcohol abuse with intoxication, unspecified: Secondary | ICD-10-CM | POA: Insufficient documentation

## 2023-04-27 DIAGNOSIS — F1010 Alcohol abuse, uncomplicated: Secondary | ICD-10-CM | POA: Diagnosis present

## 2023-04-27 LAB — LIPID PANEL
Cholesterol: 200 mg/dL (ref 0–200)
HDL: 113 mg/dL (ref >40)
LDL Cholesterol: 68 mg/dL (ref 0–99)
Total CHOL/HDL Ratio: 1.8 ratio
Triglycerides: 93 mg/dL (ref <150)
VLDL: 19 mg/dL (ref 0–40)

## 2023-04-27 LAB — COMPREHENSIVE METABOLIC PANEL WITH GFR
ALT: 144 U/L — ABNORMAL HIGH (ref 0–44)
AST: 195 U/L — ABNORMAL HIGH (ref 15–41)
Albumin: 4.1 g/dL (ref 3.5–5.0)
Alkaline Phosphatase: 60 U/L (ref 38–126)
Anion gap: 17 — ABNORMAL HIGH (ref 5–15)
BUN: 6 mg/dL (ref 6–20)
CO2: 20 mmol/L — ABNORMAL LOW (ref 22–32)
Calcium: 8.9 mg/dL (ref 8.9–10.3)
Chloride: 104 mmol/L (ref 98–111)
Creatinine, Ser: 0.78 mg/dL (ref 0.61–1.24)
GFR, Estimated: >60 mL/min (ref >60)
Glucose, Bld: 102 mg/dL — ABNORMAL HIGH (ref 70–99)
Potassium: 3.7 mmol/L (ref 3.5–5.1)
Sodium: 141 mmol/L (ref 135–145)
Total Bilirubin: 0.4 mg/dL (ref 0.0–1.2)
Total Protein: 7.8 g/dL (ref 6.5–8.1)

## 2023-04-27 LAB — CBC WITH DIFFERENTIAL/PLATELET
Abs Immature Granulocytes: 0.01 10*3/uL (ref 0.00–0.07)
Basophils Absolute: 0 10*3/uL (ref 0.0–0.1)
Basophils Relative: 0 %
Eosinophils Absolute: 0.1 10*3/uL (ref 0.0–0.5)
Eosinophils Relative: 2 %
HCT: 45.5 % (ref 39.0–52.0)
Hemoglobin: 15.7 g/dL (ref 13.0–17.0)
Immature Granulocytes: 0 %
Lymphocytes Relative: 55 %
Lymphs Abs: 2.5 10*3/uL (ref 0.7–4.0)
MCH: 31.5 pg (ref 26.0–34.0)
MCHC: 34.5 g/dL (ref 30.0–36.0)
MCV: 91.4 fL (ref 80.0–100.0)
Monocytes Absolute: 0.3 10*3/uL (ref 0.1–1.0)
Monocytes Relative: 7 %
Neutro Abs: 1.6 10*3/uL — ABNORMAL LOW (ref 1.7–7.7)
Neutrophils Relative %: 36 %
Platelets: 137 10*3/uL — ABNORMAL LOW (ref 150–400)
RBC: 4.98 MIL/uL (ref 4.22–5.81)
RDW: 12.6 % (ref 11.5–15.5)
WBC: 4.5 10*3/uL (ref 4.0–10.5)
nRBC: 0 % (ref 0.0–0.2)

## 2023-04-27 LAB — POCT URINE DRUG SCREEN - MANUAL ENTRY (I-SCREEN)
POC Amphetamine UR: NOT DETECTED
POC Buprenorphine (BUP): NOT DETECTED
POC Cocaine UR: NOT DETECTED
POC Marijuana UR: NOT DETECTED
POC Methadone UR: NOT DETECTED
POC Methamphetamine UR: NOT DETECTED
POC Morphine: NOT DETECTED
POC Oxazepam (BZO): NOT DETECTED
POC Oxycodone UR: NOT DETECTED
POC Secobarbital (BAR): NOT DETECTED

## 2023-04-27 LAB — ETHANOL: Alcohol, Ethyl (B): 285 mg/dL — ABNORMAL HIGH (ref <10)

## 2023-04-27 LAB — HEMOGLOBIN A1C
Hgb A1c MFr Bld: 5.2 % (ref 4.8–5.6)
Mean Plasma Glucose: 102.54 mg/dL

## 2023-04-27 MED ORDER — OLANZAPINE 5 MG PO TBDP: 5.0000 mg | ORAL_TABLET | Freq: Three times a day (TID) | ORAL | Status: DC | PRN

## 2023-04-27 MED ORDER — GABAPENTIN 300 MG PO CAPS: 300.0000 mg | ORAL_CAPSULE | Freq: Three times a day (TID) | ORAL | Status: DC

## 2023-04-27 MED ORDER — CHLORDIAZEPOXIDE HCL 25 MG PO CAPS: 25.0000 mg | ORAL_CAPSULE | Freq: Three times a day (TID) | ORAL | Status: DC

## 2023-04-27 MED ORDER — ALUM & MAG HYDROXIDE-SIMETH 200-200-20 MG/5ML PO SUSP: 30.0000 mL | ORAL | Status: DC | PRN

## 2023-04-27 MED ORDER — ACETAMINOPHEN 325 MG PO TABS
650.0000 mg | ORAL_TABLET | Freq: Four times a day (QID) | ORAL | Status: DC | PRN
  Administered 2023-04-28: 650 mg via ORAL
  Filled 2023-04-27: qty 2

## 2023-04-27 MED ORDER — ADULT MULTIVITAMIN W/MINERALS CH
1.0000 | ORAL_TABLET | Freq: Every day | ORAL | Status: DC
  Administered 2023-04-28 – 2023-04-30 (×3): 1 via ORAL
  Filled 2023-04-27 (×3): qty 1

## 2023-04-27 MED ORDER — ONDANSETRON 4 MG PO TBDP: 4.0000 mg | ORAL_TABLET | Freq: Four times a day (QID) | ORAL | Status: DC | PRN

## 2023-04-27 MED ORDER — HYDROXYZINE HCL 25 MG PO TABS: 25.0000 mg | ORAL_TABLET | Freq: Four times a day (QID) | ORAL | Status: DC | PRN

## 2023-04-27 MED ORDER — MAGNESIUM HYDROXIDE 400 MG/5ML PO SUSP: 30.0000 mL | Freq: Every day | ORAL | Status: DC | PRN

## 2023-04-27 MED ORDER — TRAZODONE HCL 50 MG PO TABS: 50.0000 mg | ORAL_TABLET | Freq: Every evening | ORAL | Status: DC | PRN

## 2023-04-27 MED ORDER — CHLORDIAZEPOXIDE HCL 25 MG PO CAPS: 25.0000 mg | ORAL_CAPSULE | ORAL | Status: DC

## 2023-04-27 MED ORDER — LOPERAMIDE HCL 2 MG PO CAPS: 2.0000 mg | ORAL_CAPSULE | ORAL | Status: DC | PRN

## 2023-04-27 MED ORDER — THIAMINE HCL 100 MG/ML IJ SOLN
100.0000 mg | Freq: Once | INTRAMUSCULAR | Status: DC
  Filled 2023-04-27: qty 2

## 2023-04-27 MED ORDER — OLANZAPINE 5 MG PO TBDP
5.0000 mg | ORAL_TABLET | Freq: Three times a day (TID) | ORAL | Status: DC | PRN
  Administered 2023-04-27: 5 mg via ORAL
  Filled 2023-04-27: qty 1

## 2023-04-27 MED ORDER — ALUM & MAG HYDROXIDE-SIMETH 200-200-20 MG/5ML PO SUSP
30.0000 mL | ORAL | Status: DC | PRN
Start: 2023-04-27 — End: 2023-04-30

## 2023-04-27 MED ORDER — OLANZAPINE 10 MG IM SOLR: 5.0000 mg | Freq: Three times a day (TID) | INTRAMUSCULAR | Status: DC | PRN

## 2023-04-27 MED ORDER — HYDROXYZINE HCL 25 MG PO TABS
25.0000 mg | ORAL_TABLET | Freq: Four times a day (QID) | ORAL | Status: DC | PRN
  Administered 2023-04-27 – 2023-04-29 (×2): 25 mg via ORAL
  Filled 2023-04-27 (×3): qty 1

## 2023-04-27 MED ORDER — ACETAMINOPHEN 325 MG PO TABS: 650.0000 mg | ORAL_TABLET | Freq: Four times a day (QID) | ORAL | Status: DC | PRN

## 2023-04-27 MED ORDER — CHLORDIAZEPOXIDE HCL 25 MG PO CAPS: 25.0000 mg | ORAL_CAPSULE | Freq: Every day | ORAL | Status: DC

## 2023-04-27 MED ORDER — ADULT MULTIVITAMIN W/MINERALS CH: 1.0000 | ORAL_TABLET | Freq: Every day | ORAL | Status: DC

## 2023-04-27 MED ORDER — CHLORDIAZEPOXIDE HCL 25 MG PO CAPS: 25.0000 mg | ORAL_CAPSULE | Freq: Four times a day (QID) | ORAL | Status: DC | PRN

## 2023-04-27 MED ORDER — CHLORDIAZEPOXIDE HCL 25 MG PO CAPS
25.0000 mg | ORAL_CAPSULE | Freq: Four times a day (QID) | ORAL | Status: DC
  Administered 2023-04-27: 25 mg via ORAL
  Filled 2023-04-27: qty 1

## 2023-04-27 MED ORDER — OLANZAPINE 10 MG IM SOLR
10.0000 mg | Freq: Three times a day (TID) | INTRAMUSCULAR | Status: DC | PRN
Start: 2023-04-27 — End: 2023-04-30

## 2023-04-27 MED ORDER — THIAMINE MONONITRATE 100 MG PO TABS: 100.0000 mg | ORAL_TABLET | Freq: Every day | ORAL | Status: DC

## 2023-04-27 NOTE — ED Notes (Addendum)
 Patient admitted to [facility] seeking treatment of alcohol and SI. On arrival patient A/Ox4 and slightly restless. Elated and pacing. Answered all questions appropriately. Patient has eczema at the upper back and stated "I have this since I was a child and also each time I drink I have these rashes during skin assessment. Denies SI or AVH at the time of admitting. Patient verbally contracted for safety. Oriented to room and unit rules. Environment secured per policy. Due medications given and patient retire to bed. Will monitor for safety.

## 2023-04-27 NOTE — ED Provider Notes (Signed)
 [facility] Continuous Assessment Admission H&P  Date: 04/28/23 Patient Name: Thomas Vaughn MRN: [id number]  [facility] Triage Screening: pt presents to [facility] voluntarily via [facility]. Pt states his fmaily is worried abt him having drinking problem. Pt states he is endorsing SI at this time with plan to cut his wrists. Pt states he has been drinking tequila 10 days straight. Pt states that he has been drinking a lot since his brother died in 05/06/23. Pt denies HI, AVH, Abuse and Drug use. Pt staes he is schizophrenic. Pt is concerned about his Liver due to his drinking.    Diagnoses:  Final diagnoses:  Alcohol abuse with intoxication (HCC)    HPI: Thomas Vaughn, a 33 year old male who presents stating he is here because "my mother and girlfriend said I got substance abuse, and have been drinking too much."  He reports consuming alcohol this morning at 8:00 AM (a 12 ounce beer).  On today's evaluation, the patient reports longstanding alcohol use beginning at age 60, with current daily use.  Although he initially minimizes intake, saying he had only 1 beer today, chart and triage information suggest a recent episode of heavier use, including 10 consecutive days of drinking tequila.  He denies withdrawal symptoms and reports no history of delirium tremens or seizures.   He reports depressive symptoms including tearfulness, hopelessness, helplessness, poor sleep, poor appetite, persistent crying.  He also endorses anxiety, paranoia, and hypervigilance, stating that at night he often shakes the doors and windows, fearing someone is coming to kill him.  He reports significant trauma and grief related to the death of his brother 6 years ago, stating he has not been the same since.  He becomes notably tearful when discussing this.    He reports experiencing auditory and visual hallucinations when under the influence of alcohol, including hearing voices that are command type in nature, urging him to harm  himself or others, and seeing things that are not there.  He endorses paranoid thoughts, including beliefs that people are watching or trying to harm him.  He denies current suicidal ideation, but admits that it "comes and goes."  He denies past suicide attempts and self harming behaviors but reports passive thoughts, such as "I would be okay going to sleep and not waking up."  He denies homicidal ideation.  Patient reports he is currently not receiving psychiatric care and has not taken psychiatric medications in over a year.  He attributes this to being a hypochondriac and reports concerns for previous medications elevated his blood pressure.  He reports a psychiatric history of depression, anxiety, bipolar disorder, PTSD, and schizophrenia.  He states he was previously told he uses alcohol to self medicate.  He reports past psychiatric treatment in [place], last seen by provider 2 years ago.  Reports no psychiatric medications for over a year.  Denies other substance use.  Reports meeting alcohol at work, with his girlfriend bringing it to him.  Reports he is separated, reports significant emotional toll after wife left him.  Reports having 6 children, oldest is 62 years old, girlfriend is currently pregnant.  Reports he lives with girlfriend, her 7 children, and her 105 year old daughter who has a baby.  Reports he dropped out of high school in ninth grade, later obtained a culinary degree from [employer].  Reports no current legal issues.  Denies access to firearms.  Reports a family history of mental illness (mother with depression).  He appeared tearful throughout the interview, especially  when discussing his deceased brother.  Mood: depressed and anxious.  Affect: labile and tearful.  Thought content: passive suicidal thoughts, paranoid ideation, grief related content.  Insight and judgment: limited, possibly minimizing alcohol use.  .    Discussed recommendation for admission to Facility Based Crisis  unit for alcohol detox.  Discussed milieu and expectations.  Patient provided with opportunities for questions.  He verbalized understanding and is in agreement with plan. Patient will be admitted to the continuous observation unit for safety monitoring pending transfer to Facility Based Crisis unit.   Total Time spent with patient: 45 minutes  Musculoskeletal  Strength & Muscle Tone: within normal limits Gait & Station: normal Patient leans: N/A  Psychiatric Specialty Exam  Presentation General Appearance: Appropriate for Environment; Fairly Groomed  Eye Contact:Fair  Speech:Clear and Coherent; Normal Rate  Speech Volume:Normal  Handedness:Right   Mood and Affect  Mood:Euthymic  Affect:Congruent; Appropriate   Thought Process  Thought Processes:Coherent  Descriptions of Associations:Intact  Orientation:Full (Time, Place and Person)  Thought Content:Logical; Paranoid Ideation    Hallucinations:Hallucinations: None  Ideas of Reference:None  Suicidal Thoughts:Suicidal Thoughts: No  Homicidal Thoughts:Homicidal Thoughts: No   Sensorium  Memory:Remote Good  Judgment:Fair  Insight:Fair   Executive Functions  Concentration:Good  Attention Span:Good  Recall:Good  Fund of Knowledge:Good  Language:Good   Psychomotor Activity  Psychomotor Activity:Psychomotor Activity: Normal   Assets  Assets:Communication Skills; Resilience; Social Support   Sleep  Sleep:Sleep: Fair   No data recorded  Physical Exam Vitals reviewed.  Constitutional:      General: He is not in acute distress.    Appearance: He is not ill-appearing.  Cardiovascular:     Rate and Rhythm: Normal rate.     Pulses: Normal pulses.  Pulmonary:     Effort: No respiratory distress.  Neurological:     Mental Status: He is alert and oriented to person, place, and time.    Review of Systems  Constitutional: Negative.   Respiratory:  Negative for shortness of breath.    Cardiovascular:  Negative for chest pain and palpitations.  Gastrointestinal:  Negative for constipation, diarrhea, nausea and vomiting.  Skin: Negative.   Neurological:  Negative for dizziness, tingling, tremors and headaches.  Psychiatric/Behavioral:  Positive for depression, hallucinations and substance abuse. Negative for suicidal ideas. The patient is nervous/anxious and has insomnia.     Blood pressure 132/87, pulse 88, temperature 98.5 F (36.9 C), resp. rate 18, SpO2 98%. There is no height or weight on file to calculate BMI.  Past Psychiatric History: As listed above  Is the patient at risk to self? Yes  Has the patient been a risk to self in the past 6 months? No .    Has the patient been a risk to self within the distant past? Yes   Is the patient a risk to others? No   Has the patient been a risk to others in the past 6 months? No   Has the patient been a risk to others within the distant past? No    Last Labs:  Admission on 04/27/2023  Component Date Value Ref Range Status   Free T4 04/27/2023 0.70  0.61 - 1.12 ng/dL Final   Comment: (NOTE) Biotin ingestion may interfere with free T4 tests. If the results are inconsistent with the TSH level, previous test results, or the clinical presentation, then consider biotin interference. If needed, order repeat testing after stopping biotin. Performed at [facility], [address], [place], [place]  [address]   Admission on 04/27/2023, Discharged on 04/27/2023  Component Date Value Ref Range Status   WBC 04/27/2023 4.5  4.0 - 10.5 K/uL Final   RBC 04/27/2023 4.98  4.22 - 5.81 MIL/uL Final   Hemoglobin 04/27/2023 15.7  13.0 - 17.0 g/dL Final   HCT 11/91/4782 45.5  39.0 - 52.0 % Final   MCV 04/27/2023 91.4  80.0 - 100.0 fL Final   MCH 04/27/2023 31.5  26.0 - 34.0 pg Final   MCHC 04/27/2023 34.5  30.0 - 36.0 g/dL Final   RDW 95/62/1308 12.6  11.5 - 15.5 % Final   Platelets 04/27/2023 137 (L)  150 - 400 K/uL Final    nRBC 04/27/2023 0.0  0.0 - 0.2 % Final   Neutrophils Relative % 04/27/2023 36  % Final   Neutro Abs 04/27/2023 1.6 (L)  1.7 - 7.7 K/uL Final   Lymphocytes Relative 04/27/2023 55  % Final   Lymphs Abs 04/27/2023 2.5  0.7 - 4.0 K/uL Final   Monocytes Relative 04/27/2023 7  % Final   Monocytes Absolute 04/27/2023 0.3  0.1 - 1.0 K/uL Final   Eosinophils Relative 04/27/2023 2  % Final   Eosinophils Absolute 04/27/2023 0.1  0.0 - 0.5 K/uL Final   Basophils Relative 04/27/2023 0  % Final   Basophils Absolute 04/27/2023 0.0  0.0 - 0.1 K/uL Final   Immature Granulocytes 04/27/2023 0  % Final   Abs Immature Granulocytes 04/27/2023 0.01  0.00 - 0.07 K/uL Final   Performed at [facility], [address] [place], [place] [address]   Sodium 04/27/2023 141  135 - 145 mmol/L Final   Potassium 04/27/2023 3.7  3.5 - 5.1 mmol/L Final   Chloride 04/27/2023 104  98 - 111 mmol/L Final   CO2 04/27/2023 20 (L)  22 - 32 mmol/L Final   Glucose, Bld 04/27/2023 102 (H)  70 - 99 mg/dL Final   Glucose reference range applies only to samples taken after fasting for at least 8 hours.   BUN 04/27/2023 6  6 - 20 mg/dL Final   Creatinine, Ser 04/27/2023 0.78  0.61 - 1.24 mg/dL Final   Calcium 69/62/9528 8.9  8.9 - 10.3 mg/dL Final   Total Protein 41/32/4401 7.8  6.5 - 8.1 g/dL Final   Albumin 02/72/5366 4.1  3.5 - 5.0 g/dL Final   AST 44/03/4740 195 (H)  15 - 41 U/L Final   ALT 04/27/2023 144 (H)  0 - 44 U/L Final   Alkaline Phosphatase 04/27/2023 60  38 - 126 U/L Final   Total Bilirubin 04/27/2023 0.4  0.0 - 1.2 mg/dL Final   GFR, Estimated 04/27/2023 >60  >60 mL/min Final   Comment: (NOTE) Calculated using the CKD-EPI Creatinine Equation (2021)    Anion gap 04/27/2023 17 (H)  5 - 15 Final   Performed at [facility], [address] [place], [place] [address]   Hgb A1c MFr Bld 04/27/2023 5.2  4.8 - 5.6 % Final   Comment: (NOTE) Pre diabetes:          5.7%-6.4%  Diabetes:               >6.4%  Glycemic control for   <7.0% adults with diabetes    Mean Plasma Glucose 04/27/2023 102.54  mg/dL Final   Performed at [facility], [address], [place], [place] [address]   Alcohol, Ethyl (B) 04/27/2023 285 (H)  <10 mg/dL Final   Comment: (NOTE)  For medical purposes only. Performed at [facility], [address] [place], [place] [address]    Cholesterol 04/27/2023 200  0 - 200 mg/dL Final   Triglycerides 30/86/5784 93  <150 mg/dL Final   HDL 69/62/9528 113  >40 mg/dL Final   Total CHOL/HDL Ratio 04/27/2023 1.8  RATIO Final   VLDL 04/27/2023 19  0 - 40 mg/dL Final   LDL Cholesterol 04/27/2023 68  0 - 99 mg/dL Final   Comment:        Total Cholesterol/HDL:CHD Risk Coronary Heart Disease Risk Table                     Men   Women  1/2 Average Risk   3.4   3.3  Average Risk       5.0   4.4  2 X Average Risk   9.6   7.1  3 X Average Risk  23.4   11.0        Use the calculated Patient Ratio above and the CHD Risk Table to determine the patient's CHD Risk.        ATP III CLASSIFICATION (LDL):  <100     mg/dL   Optimal  413-244  mg/dL   Near or Above                    Optimal  130-159  mg/dL   Borderline  010-272  mg/dL   High  >536     mg/dL   Very High Performed at [facility], [address], [place], [place] [address]    TSH 04/27/2023 0.202 (L)  0.350 - 4.500 uIU/mL Final   Comment: Performed by a 3rd Generation assay with a functional sensitivity of <=0.01 uIU/mL. Performed at [facility], [address], [place], [place] [address]    POC Amphetamine UR 04/27/2023 None Detected  NONE DETECTED (Cut Off Level 1000 ng/mL) Final   POC Secobarbital (BAR) 04/27/2023 None Detected  NONE DETECTED (Cut Off Level 300 ng/mL) Final   POC Buprenorphine (BUP) 04/27/2023 None Detected  NONE DETECTED (Cut Off Level 10 ng/mL) Final   POC Oxazepam (BZO) 04/27/2023 None Detected  NONE DETECTED (Cut Off Level 300 ng/mL) Final   POC Cocaine UR  04/27/2023 None Detected  NONE DETECTED (Cut Off Level 300 ng/mL) Final   POC Methamphetamine UR 04/27/2023 None Detected  NONE DETECTED (Cut Off Level 1000 ng/mL) Final   POC Morphine 04/27/2023 None Detected  NONE DETECTED (Cut Off Level 300 ng/mL) Final   POC Methadone UR 04/27/2023 None Detected  NONE DETECTED (Cut Off Level 300 ng/mL) Final   POC Oxycodone UR 04/27/2023 None Detected  NONE DETECTED (Cut Off Level 100 ng/mL) Final   POC Marijuana UR 04/27/2023 None Detected  NONE DETECTED (Cut Off Level 50 ng/mL) Final    Allergies: [place]ra  Medications:  Facility Ordered Medications  Medication   acetaminophen (TYLENOL) tablet 650 mg   alum & mag hydroxide-simeth (MAALOX/MYLANTA) 200-200-20 MG/5ML suspension 30 mL   thiamine (VITAMIN B1) injection 100 mg   multivitamin with minerals tablet 1 tablet   hydrOXYzine (ATARAX) tablet 25 mg   loperamide (IMODIUM) capsule 2-4 mg   ondansetron (ZOFRAN-ODT) disintegrating tablet 4 mg   OLANZapine zydis (ZYPREXA) disintegrating tablet 5 mg   OLANZapine (ZYPREXA) injection 5 mg   OLANZapine (ZYPREXA) injection 10 mg   LORazepam (ATIVAN) tablet 1 mg   LORazepam (ATIVAN) tablet 1 mg  Followed by   Cecily Cohen ON 04/29/2023] LORazepam (ATIVAN) tablet 1 mg   Followed by   Cecily Cohen ON 04/30/2023] LORazepam (ATIVAN) tablet 1 mg   Followed by   Cecily Cohen ON 05/01/2023] LORazepam (ATIVAN) tablet 1 mg   thiamine (VITAMIN B1) tablet 100 mg   nicotine (NICODERM CQ - dosed in mg/24 hours) patch 14 mg   albuterol (VENTOLIN HFA) 108 (90 Base) MCG/ACT inhaler 1-2 puff   QUEtiapine (SEROQUEL) tablet 25 mg   disulfiram (ANTABUSE) tablet 250 mg      Medical Decision Making  Given his high risk alcohol use, passive suicidality, poor insight, and limited social supports, admission to the Facility Based Crisis unit for supervised alcohol detoxification and stabilization is recommended. He is amenable to the plan.   Hold off on CIWA Ativan protocol due to listed  allergy.  Start gabapentin and hydroxyzine for anxiety, mood stabilization, and alcohol withdrawal support.  Treatment Plan: Patient admitted [facility] continued assessment for safety. She is recommended for inpatient psychiatric hospitalization for stabilization in a structured environment.      Lab Orders         CBC with Differential/Platelet         Comprehensive metabolic panel         Hemoglobin A1c         Ethanol         Lipid panel         TSH         Lithium level       POCT Urine Drug Screen - (I-Screen)          EKG   1. Psychiatric Treatment:    # Alcohol use disorder -- CIWA assessment protocol -- Gabapentin 300 mg, 3 times daily -- Thiamine 100 mg daily -- Multivitamin with minerals daily -- Zofran 4 mg every 6 hours as needed, nausea -- Hydroxyzine 25 mg, every 6 hours as needed, anxiety/agitation or CIWA < or =, starting today                #As Needed Medications --Trazodone 50 mg, oral, daily at bedtime as needed, sleep   #BH Agitation Protocol -- See MAR                2. Medical Issues Being Addressed:    #As Needed Medications --Tylenol 650 mg every 6 hours, as needed, mild pain, fever --Maalox/Mylanta 30 mL oral every 4 hours as needed, indigestion --Milk of Magnesia 30 mL oral daily as needed, mild constipation    Recommendations  Based on my evaluation the patient does not appear to have an emergency medical condition.  Lennard Quirk, NP 04/28/23  9:07 PM
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
## 2023-04-27 NOTE — Progress Notes (Signed)
   04/27/23 1527  BHUC Triage Screening (Walk-ins at [facility] only)  What Is the Reason for Your Visit/Call Today? pt presents to [facility] voluntarily via [facility]. Pt states his fmaily is worried abt him having drinking problem. Pt states he is endorsing SI at this time with plan to cut his wrists. Pt states he has been drinking tequila 10 days straight. Pt states that he has been drinking a lot since his brother died in 05/25/23. Pt denies HI, AVH, Abuse and Drug use. Pt staes he is schizophrenic. Pt is concerned about his Liver due to his drinking.  How Long Has This Been Causing You Problems? > than 6 months  Have You Recently Had Any Thoughts About Hurting Yourself? Yes  How long ago did you have thoughts about hurting yourself? daily - plan to cut wrists  Are You Planning to Commit Suicide/Harm Yourself At This time? Yes  Have you Recently Had Thoughts About Hurting Someone Marigene Shoulder? No  Are You Planning To Harm Someone At This Time? No  Physical Abuse Denies  Verbal Abuse Denies  Sexual Abuse Denies  Exploitation of patient/patient's resources Denies  Self-Neglect Denies  Are you currently experiencing any auditory, visual or other hallucinations? No  Have You Used Any Alcohol or Drugs in the Past 24 Hours? Yes  What Did You Use and How Much? "Ive been drinking tequila for 10 days now"  Do you have any current medical co-morbidities that require immediate attention? No  Clinician description of patient physical appearance/behavior: Pt is tearful and confused. Pt stood up and asked to leave during triage process, was taken to the back sallyport, then decided he wanted to stay and was laughing about the situation.  What Do You Feel Would Help You the Most Today? Alcohol or Drug Use Treatment  If access to [facility] was not available, would you have sought care in the Emergency Department? No  Determination of Need Emergent (2 hours)  Options For Referral Outpatient Therapy;Facility-Based Crisis;Inpatient  Hospitalization;Intensive Outpatient Therapy;Medication Management;Chemical Dependency Intensive Outpatient Therapy (CDIOP)  Determination of Need filed? Yes

## 2023-04-28 DIAGNOSIS — F10150 Alcohol abuse with alcohol-induced psychotic disorder with delusions: Secondary | ICD-10-CM | POA: Diagnosis not present

## 2023-04-28 DIAGNOSIS — F411 Generalized anxiety disorder: Secondary | ICD-10-CM | POA: Diagnosis not present

## 2023-04-28 DIAGNOSIS — F22 Delusional disorders: Secondary | ICD-10-CM | POA: Diagnosis not present

## 2023-04-28 DIAGNOSIS — F331 Major depressive disorder, recurrent, moderate: Secondary | ICD-10-CM | POA: Diagnosis not present

## 2023-04-28 LAB — T4, FREE: Free T4: 0.7 ng/dL (ref 0.61–1.12)

## 2023-04-28 LAB — TSH: TSH: 0.202 u[IU]/mL — ABNORMAL LOW (ref 0.350–4.500)

## 2023-04-28 MED ORDER — LORAZEPAM 1 MG PO TABS
1.0000 mg | ORAL_TABLET | Freq: Three times a day (TID) | ORAL | Status: AC
  Administered 2023-04-29 (×3): 1 mg via ORAL
  Filled 2023-04-28 (×3): qty 1

## 2023-04-28 MED ORDER — LORAZEPAM 1 MG PO TABS: 1.0000 mg | ORAL_TABLET | Freq: Every day | ORAL | Status: DC

## 2023-04-28 MED ORDER — QUETIAPINE FUMARATE 25 MG PO TABS
25.0000 mg | ORAL_TABLET | Freq: Every day | ORAL | Status: DC
  Administered 2023-04-28 – 2023-04-29 (×2): 25 mg via ORAL
  Filled 2023-04-28 (×2): qty 1

## 2023-04-28 MED ORDER — THIAMINE MONONITRATE 100 MG PO TABS
100.0000 mg | ORAL_TABLET | Freq: Every day | ORAL | Status: DC
  Administered 2023-04-28 – 2023-04-30 (×3): 100 mg via ORAL
  Filled 2023-04-28 (×3): qty 1

## 2023-04-28 MED ORDER — LORAZEPAM 1 MG PO TABS
1.0000 mg | ORAL_TABLET | Freq: Two times a day (BID) | ORAL | Status: DC
  Administered 2023-04-30: 1 mg via ORAL
  Filled 2023-04-28: qty 1

## 2023-04-28 MED ORDER — LORAZEPAM 1 MG PO TABS
1.0000 mg | ORAL_TABLET | Freq: Four times a day (QID) | ORAL | Status: AC
  Administered 2023-04-28 (×4): 1 mg via ORAL
  Filled 2023-04-28 (×4): qty 1

## 2023-04-28 MED ORDER — THIAMINE MONONITRATE 100 MG PO TABS: 100.0000 mg | ORAL_TABLET | Freq: Every day | ORAL | Status: DC

## 2023-04-28 MED ORDER — LORAZEPAM 1 MG PO TABS: 1.0000 mg | ORAL_TABLET | Freq: Four times a day (QID) | ORAL | Status: DC | PRN

## 2023-04-28 MED ORDER — THIAMINE HCL 100 MG/ML IJ SOLN: 100.0000 mg | Freq: Once | INTRAMUSCULAR | Status: DC

## 2023-04-28 MED ORDER — DISULFIRAM 250 MG PO TABS
250.0000 mg | ORAL_TABLET | Freq: Every day | ORAL | Status: DC
  Administered 2023-04-28 – 2023-04-30 (×3): 250 mg via ORAL
  Filled 2023-04-28 (×3): qty 1

## 2023-04-28 MED ORDER — NICOTINE 14 MG/24HR TD PT24
14.0000 mg | MEDICATED_PATCH | Freq: Every day | TRANSDERMAL | Status: DC
  Administered 2023-04-28 – 2023-04-30 (×3): 14 mg via TRANSDERMAL
  Filled 2023-04-28 (×3): qty 1

## 2023-04-28 MED ORDER — ALBUTEROL SULFATE HFA 108 (90 BASE) MCG/ACT IN AERS: 1.0000 | INHALATION_SPRAY | Freq: Four times a day (QID) | RESPIRATORY_TRACT | Status: DC | PRN

## 2023-04-28 MED ORDER — ADULT MULTIVITAMIN W/MINERALS CH: 1.0000 | ORAL_TABLET | Freq: Every day | ORAL | Status: DC

## 2023-04-28 NOTE — Care Management (Addendum)
 [facility] Care Management   Writer referred patient to [facility], [facility], [facility] and [facility].  Patient no longer wants inpatient substance abuse treatment.  Writer arranged for the patient to receive a CD-IOP intake appointment on 05-04-23 at 9am in person at  [address]  [address]  [place]  [phone]

## 2023-04-28 NOTE — ED Notes (Signed)
 Patient in the bedroom composed and and sleeping. NAD. Will continue to monitor for safety

## 2023-04-28 NOTE — Group Note (Signed)
 Group Topic: Change and Accountability  Group Date: 04/28/2023 Start Time: 1200 End Time: 1230 Facilitators: Izola Mart, NT  Department: [facility]  Number of Participants: 3  Group Focus: activities of daily living skills Treatment Modality:  Dialectical Behavioral Therapy Interventions utilized were exploration Purpose: express irrational fears  Name: Thomas Vaughn Mention Date of Birth: 1990-11-04  MR: [id number]    Level of Participation: moderate Quality of Participation: attentive Interactions with others: intrusive Mood/Affect: anxious Triggers (if applicable):   Cognition: fearful Progress: Significant Response:   Plan: follow-up needed  Patients Problems:  Patient Active Problem List   Diagnosis Date Noted   Alcohol abuse 04/16/2017   Bipolar disorder, unspecified (HCC) 04/15/2017   ADHD (attention deficit hyperactivity disorder)    HYPOKALEMIA 07/29/2009   ANEMIA OF OTHER CHRONIC DISEASE 07/29/2009   GYNECOMASTIA 07/29/2009   CHEST PAIN UNSPECIFIED 07/29/2009

## 2023-04-28 NOTE — ED Notes (Signed)
 Pt sleeping in no acute distress. RR even and unlabored. Environment secured. Will continue to monitor for safety.

## 2023-04-28 NOTE — Group Note (Signed)
 Group Topic: Identity and Relationships  Group Date: 04/28/2023 Start Time: 1930 End Time: 2000 Facilitators: Wendall Halls B  Department: [facility]  Number of Participants: 5  Group Focus: abuse issues, acceptance, check in, coping skills, daily focus, discharge education, goals/reality orientation, individual meeting, problem solving, relapse prevention, safety plan, self-awareness, self-esteem, social skills, and substance abuse education Treatment Modality:  Leisure Development Interventions utilized were leisure development, problem solving, story telling, and support Purpose: enhance coping skills, express feelings, express irrational fears, improve communication skills, increase insight, regain self-worth, reinforce self-care, and relapse prevention strategies  Name: Thomas Vaughn Mention Date of Birth: 1990-02-26  MR: [id number]    Level of Participation: PT DID NOT ATTEND GROUP Quality of Participation: cooperative Interactions with others: gave feedback Mood/Affect: appropriate Triggers (if applicable): NA Cognition: coherent/clear Progress: None Response: Pt was upset that we changed the channel on the tv so he went to his room and didn't attend groups.  Plan: patient will be encouraged to go to groups.   Patients Problems:  Patient Active Problem List   Diagnosis Date Noted   Alcohol abuse 04/16/2017   Bipolar disorder, unspecified (HCC) 04/15/2017   ADHD (attention deficit hyperactivity disorder)    HYPOKALEMIA 07/29/2009   ANEMIA OF OTHER CHRONIC DISEASE 07/29/2009   GYNECOMASTIA 07/29/2009   CHEST PAIN UNSPECIFIED 07/29/2009

## 2023-04-28 NOTE — Group Note (Signed)
 Group Topic: Communication  Group Date: 04/28/2023 Start Time: 0930 End Time: 1100 Facilitators: Denzil Flatten, RN  Department: [facility]  Number of Participants: 3  Group Focus: communication Treatment Modality:  Individual Therapy Interventions utilized were patient education Purpose: medication education  Name: Thomas Vaughn Date of Birth: 1990/07/18  MR: [id number]    Level of Participation: active Quality of Participation: cooperative Interactions with others: gave feedback Mood/Affect: agitated and appropriate Triggers (if applicable): none identified Cognition: coherent/clear Progress: Gaining insight Response: Pt verbalized understanding of all medication administered Plan: patient will be encouraged to remain compliant with tx plan and voice concerns about medications taken  Patients Problems:  Patient Active Problem List   Diagnosis Date Noted   Alcohol abuse 04/16/2017   Bipolar disorder, unspecified (HCC) 04/15/2017   ADHD (attention deficit hyperactivity disorder)    HYPOKALEMIA 07/29/2009   ANEMIA OF OTHER CHRONIC DISEASE 07/29/2009   GYNECOMASTIA 07/29/2009   CHEST PAIN UNSPECIFIED 07/29/2009

## 2023-04-28 NOTE — ED Notes (Signed)
 Patient A&Ox4. Denies intent to harm self/others when asked. Denies A/VH. Patient denies any physical complaints when asked. No acute distress noted. Pt states, "I feel alright today. Better than I did yesterday". Support and encouragement provided. Routine safety checks conducted according to facility protocol. Encouraged patient to notify staff if thoughts of harm toward self or others arise. Patient verbalize understanding and agreement. Will continue to monitor for safety.

## 2023-04-28 NOTE — Discharge Instructions (Addendum)
 Based on the information that you have provided and the presenting issues outpatient services and resources for have been recommended.  It is imperative that you follow through with treatment recommendations within 5-7 days from the of discharge to mitigate further risk to your safety and mental well-being. A list of referrals has been provided below to get you started.  You are not limited to the list provided.  In case of an urgent crisis, you may contact the Mobile Crisis Unit with [facility] at [phone].   Please go to your CD-IOP intake appointment on 05-04-23 at 9am in person at  [address]  [address]  [place]  [phone]

## 2023-04-28 NOTE — ED Provider Notes (Addendum)
 Facility Based Crisis Admission H&P  Date: 04/28/23 Patient Name: Thomas Vaughn MRN: [id number]  Diagnoses:  Final diagnoses:  Alcohol use  Moderate episode of recurrent major depressive disorder (HCC)  Paranoid ideation (HCC)     Thomas Vaughn is a 33 y.o. male with a history of MDD and GAD, who presented to [facility] due to SI with plan to cut his wrist and alcohol detox.  HPI:  Per BH assessment note, Pt is a 33 yo male who presented voluntarily and unaccompanied requesting detox from alcohol. Pt endorsed some SI that "comes and goes" in the form of thoughts of wishing he would not wake up. Pt denied any specific plans and any past suicide attempts. Pt denied HI, self harm and endorsed hearing voices at times telling him to hurt himself or hurt someone else. Pt could not quantify how often he has AH but stated it is infrequent and often in conjunction with his consuming alcohol. Pt stated that he currently drinks alcohol daily. Pt stated that his last consumption of alcohol was earlier today when he drank 1 beer. Pt did have an aroma that smelled like alcohol and seemed sluggish with glassy eyes. Pt stated that he has been able to stop drinking alcohol completely for a year or two at a time in the past but has always started again. Pt stated that he has the insight that he is self-medicating. Pt stated that he once was prescribed medication for MDD and GAD but stopped taking them about a year ago. Pt stated that he currently does not have any OP psychiatric providers.    Pt was tearful at times especially when talking about his brother who was shot and killed about 6 years ago. Pt stated that he often feels paranoid thinking someone is trying to kill or hurt him and his family. Pt stated that often he wakes during the night and feels compelled to walk around to check for safety which interferes with his sleep. Pt stated that he has only been admitted to a psychiatric facility when he stayed  overnight at [facility] OBS unit about a year ago.    Pt stated that he currently lives with his girlfriend who is pregnant. Pt stated that he was divorced about a year ago and did not want to be divorced from his wife. Pt stated that he is the father to 7 children ranging in age from 54 yo to his unborn baby. Pt stated that he currently lives with his girlfriend, her 7 children including her 64 yo daughter and her baby. Pt stated that he has training in from culinary program and has been employed as a chef/cook in [employer]. Pt stated that he has recently lost his job due to alcohol use.   Patient seen in the milieu, no acute distress. He reports feeling well. He reports sleeping and eating well. He states that he is in the [facility] due to alcohol. He states "my mom, girlfriend, and kids told me that in every picture I look drunk". He also states that he buys alcohol every day, even on Sundays. He reports drinking alcohol since he was 33 years old. He drinks 1/5th liquor daily, most recently 4/16 AM. He denies current withdrawal symptoms. He denies history of withdrawal seizures and Dts. He reports having paranoia of people following him and states that he avoids certain places due to this fear. He states he thinks cars are following him when he is in the car. He  denies SI, HI, and AVH. He denies symptoms of mania including multiple days straight of persistently elevated mood or energy. He prefers to go home after detoxing and states that he has to work and cannot go to residential treatment after detoxing. He is open to CDIOP. I discussed starting Seroquel to aid with paranoia and also provided options for alcohol use and he agreed to start disulfiram. I discussed the risks and benefits of the medication.   PHQ 2-9:  Flowsheet Row ED from 02/13/2022 in [facility]  Thoughts that you would be better off dead, or of hurting yourself in some way Not at all  PHQ-9 Total Score 5       Flowsheet Row ED from 02/13/2022 in [facility]  Thoughts that you would be better off dead, or of hurting yourself in some way Not at all  PHQ-9 Total Score 5       Flowsheet Row ED from 04/27/2023 in [facility] ED from 03/16/2022 in [facility] ED from 02/13/2022 in [facility]  C-SSRS RISK CATEGORY High Risk No Risk No Risk      Total Time spent with patient: 1 hour  Past Psychiatric History:  Diagnoses: AUD, per chart review MDD, GAD Current psychotropic meds: none Previous psychotropic meds: does not recall Psychiatrist/therapist: denies Suicide attempts: Denies  Substance Use History (onset, amount, frequency, most recent use, period of sobriety):  Tobacco: smokes 1 cigar daily Alcohol: drinks 1/5 liquor daily, most recent drink 4/16; denies withdrawal seizures and DT Marijuana: denies Stimulant: denies Opioid: denies Benzo: denies  Rehab history: denies  Past Medical History: Dx:  has a past medical history of ADHD (attention deficit hyperactivity disorder), Anxiety, Asthma, and Depression.  Allergies: Okra  Seizures: denies  Family Psychiatric History: denies  Social History: Housing: lives with girlfriend who is pregnant Occupation: training in a Clinical research associate, Investment banker, operational in [employer] but recently lsot his job due to alcohol use Support: girlfriend, mother Legal: denies  Musculoskeletal  Strength & Muscle Tone: within normal limits Gait & Station: normal Patient leans: N/A  Psychiatric Specialty Exam  Presentation General Appearance:Appropriate for Environment, Fairly Groomed Eye Contact:Fair Speech:Clear and Coherent, Normal Rate Volume:Normal Handedness:Right  Mood and Affect  Mood:Euthymic Affect:Congruent, Appropriate  Thought Process  Thought Process:Coherent Descriptions of Associations:Intact  Thought Content Suicidal  Thoughts:No Homicidal Thoughts:No Hallucinations:None Ideas of Reference:None Thought Content:Logical, Paranoid Ideation  Sensorium  Memory:Remote Good Judgment:Fair Insight:Fair  Executive Functions  Orientation:Full (Time, Place and Person) Language:Good Concentration:Good Attention:Good Recall:Good Fund of Knowledge:Good  Psychomotor Activity  Psychomotor Activity:Psychomotor Activity: Normal  Assets  Assets:Communication Skills, Resilience, Social Support  Sleep  Quality:Fair   Physical Exam Vitals reviewed.  Constitutional:      Appearance: Normal appearance.  HENT:     Head: Normocephalic and atraumatic.  Cardiovascular:     Rate and Rhythm: Normal rate.  Pulmonary:     Effort: Pulmonary effort is normal.  Neurological:     General: No focal deficit present.     Mental Status: He is alert and oriented to person, place, and time.    Blood pressure 106/66, pulse 90, temperature (!) 97.1 F (36.2 C), temperature source Oral, resp. rate 18, SpO2 97%. There is no height or weight on file to calculate BMI.   Is the patient at risk to self? No  Has the patient been a risk to self in the past 6 months?  No .    Has the patient been a risk to self within the distant past? No   Is the patient a risk to others? No   Has the patient been a risk to others in the past 6 months? No   Has the patient been a risk to others within the distant past? No   Last Labs:  Admission on 04/27/2023, Discharged on 04/27/2023  Component Date Value Ref Range Status   WBC 04/27/2023 4.5  4.0 - 10.5 K/uL Final   RBC 04/27/2023 4.98  4.22 - 5.81 MIL/uL Final   Hemoglobin 04/27/2023 15.7  13.0 - 17.0 g/dL Final   HCT 40/98/1191 45.5  39.0 - 52.0 % Final   MCV 04/27/2023 91.4  80.0 - 100.0 fL Final   MCH 04/27/2023 31.5  26.0 - 34.0 pg Final   MCHC 04/27/2023 34.5  30.0 - 36.0 g/dL Final   RDW 47/82/9562 12.6  11.5 - 15.5 % Final   Platelets 04/27/2023 137 (L)  150 - 400 K/uL Final    nRBC 04/27/2023 0.0  0.0 - 0.2 % Final   Neutrophils Relative % 04/27/2023 36  % Final   Neutro Abs 04/27/2023 1.6 (L)  1.7 - 7.7 K/uL Final   Lymphocytes Relative 04/27/2023 55  % Final   Lymphs Abs 04/27/2023 2.5  0.7 - 4.0 K/uL Final   Monocytes Relative 04/27/2023 7  % Final   Monocytes Absolute 04/27/2023 0.3  0.1 - 1.0 K/uL Final   Eosinophils Relative 04/27/2023 2  % Final   Eosinophils Absolute 04/27/2023 0.1  0.0 - 0.5 K/uL Final   Basophils Relative 04/27/2023 0  % Final   Basophils Absolute 04/27/2023 0.0  0.0 - 0.1 K/uL Final   Immature Granulocytes 04/27/2023 0  % Final   Abs Immature Granulocytes 04/27/2023 0.01  0.00 - 0.07 K/uL Final   Sodium 04/27/2023 141  135 - 145 mmol/L Final   Potassium 04/27/2023 3.7  3.5 - 5.1 mmol/L Final   Chloride 04/27/2023 104  98 - 111 mmol/L Final   CO2 04/27/2023 20 (L)  22 - 32 mmol/L Final   Glucose, Bld 04/27/2023 102 (H)  70 - 99 mg/dL Final   BUN 13/08/6576 6  6 - 20 mg/dL Final   Creatinine, Ser 04/27/2023 0.78  0.61 - 1.24 mg/dL Final   Calcium 46/96/2952 8.9  8.9 - 10.3 mg/dL Final   Total Protein 84/13/2440 7.8  6.5 - 8.1 g/dL Final   Albumin 11/07/2534 4.1  3.5 - 5.0 g/dL Final   AST 64/40/3474 195 (H)  15 - 41 U/L Final   ALT 04/27/2023 144 (H)  0 - 44 U/L Final   Alkaline Phosphatase 04/27/2023 60  38 - 126 U/L Final   Total Bilirubin 04/27/2023 0.4  0.0 - 1.2 mg/dL Final   GFR, Estimated 04/27/2023 >60  >60 mL/min Final   Anion gap 04/27/2023 17 (H)  5 - 15 Final   Hgb A1c MFr Bld 04/27/2023 5.2  4.8 - 5.6 % Final   Mean Plasma Glucose 04/27/2023 102.54  mg/dL Final   Alcohol, Ethyl (B) 04/27/2023 285 (H)  <10 mg/dL Final   Cholesterol 25/95/6387 200  0 - 200 mg/dL Final   Triglycerides 56/43/3295 93  <150 mg/dL Final   HDL 18/84/1660 113  >40 mg/dL Final   Total CHOL/HDL Ratio 04/27/2023 1.8  RATIO Final   VLDL 04/27/2023 19  0 - 40 mg/dL Final   LDL Cholesterol 04/27/2023 68  0 - 99 mg/dL  Final   TSH 04/27/2023  0.202 (L)  0.350 - 4.500 uIU/mL Final   POC Amphetamine UR 04/27/2023 None Detected  NONE DETECTED (Cut Off Level 1000 ng/mL) Final   POC Secobarbital (BAR) 04/27/2023 None Detected  NONE DETECTED (Cut Off Level 300 ng/mL) Final   POC Buprenorphine (BUP) 04/27/2023 None Detected  NONE DETECTED (Cut Off Level 10 ng/mL) Final   POC Oxazepam (BZO) 04/27/2023 None Detected  NONE DETECTED (Cut Off Level 300 ng/mL) Final   POC Cocaine UR 04/27/2023 None Detected  NONE DETECTED (Cut Off Level 300 ng/mL) Final   POC Methamphetamine UR 04/27/2023 None Detected  NONE DETECTED (Cut Off Level 1000 ng/mL) Final   POC Morphine 04/27/2023 None Detected  NONE DETECTED (Cut Off Level 300 ng/mL) Final   POC Methadone UR 04/27/2023 None Detected  NONE DETECTED (Cut Off Level 300 ng/mL) Final   POC Oxycodone UR 04/27/2023 None Detected  NONE DETECTED (Cut Off Level 100 ng/mL) Final   POC Marijuana UR 04/27/2023 None Detected  NONE DETECTED (Cut Off Level 50 ng/mL) Final  Allergies: Okra Medications: See below  Long Term Goals: Improvement in symptoms so as ready for discharge   Short Term Goals: Patient will verbalize feelings in meetings with treatment team members., Patient will attend at least of 50% of the groups daily., Pt will complete the PHQ9 on admission, day 3 and discharge., Patient will participate in completing the [place] Suicide Severity Rating Scale, Patient will score a low risk of violence for 24 hours prior to discharge, and Patient will take medications as prescribed daily.  Medical Decision Making  Principal Problem:   Alcohol abuse    Status: Voluntary  Alcohol Use Disorder EtOH level: 285; LFTs: AST 195, ALT 144; last use date: 4/16 -Ativan taper -CIWA with Ativan as needed for CIWA greater than 10  -Last CIWA score is 0 on 4/17 at 6 AM -Start disulfiram 250 mg daily -Thiamine 100 mg IM first day and PO after that -Multivitamin with minerals daily -Tylenol 650 mg every 6 hours as  needed for pain -Zofran 4 mg every 6 hours as needed for nausea or vomiting -Imodium 2 to 4 mg as needed for diarrhea or loose stools  -Maalox/Mylanta 30 mL every 4 hours as needed for indigestion -Milk of Mag 30 mL as needed for constipation   Substance induced psychosis (paranoia) -Start Seroquel 25 mg at bedtime   A1c 5.2, EtOH 285, lipid panel WNL, EKG qtc 461     Recommendations  Based on my evaluation the patient does not appear to have an emergency medical condition.  Monitoring: UDS normal, CBC unremarkable, CMP-AST 195, ALT 144, A1c 5.2, EtOH 285, lipid panel WNL, TSH 0.202, pending T3 and T4  Orders Placed This Encounter  Procedures   SARS Coronavirus 2 by RT PCR (hospital order, performed in [facility] hospital lab) *cepheid single result test* Anterior Nasal Swab   T4, free   T3, free   Diet regular Room service appropriate? Yes; Fluid consistency: Thin   Vital signs while awake x 48 hours, then BID x 48 hours, then per unit protocol   Clinical institute withdrawal assessment while awake x 48 hours, then BID x 48 hours, then Daily x 48 hours then discontinue   Safety Checks Every 15 Minutes   Voluntary Treatment   Vital signs while awake x 48 hours, then BID x 48 hours, then per unit protocol   Clinical institute withdrawal assessment while awake x 48 hours, then BID  x 48 hours, then Daily x 48 hours then discontinue   Full code   Admit to Facility Based Crisis    Facility Ordered Medications  Medication   acetaminophen (TYLENOL) tablet 650 mg   alum & mag hydroxide-simeth (MAALOX/MYLANTA) 200-200-20 MG/5ML suspension 30 mL   thiamine (VITAMIN B1) injection 100 mg   multivitamin with minerals tablet 1 tablet   hydrOXYzine (ATARAX) tablet 25 mg   loperamide (IMODIUM) capsule 2-4 mg   ondansetron (ZOFRAN-ODT) disintegrating tablet 4 mg   OLANZapine zydis (ZYPREXA) disintegrating tablet 5 mg   OLANZapine (ZYPREXA) injection 5 mg   OLANZapine (ZYPREXA) injection 10  mg   LORazepam (ATIVAN) tablet 1 mg   LORazepam (ATIVAN) tablet 1 mg   Followed by   Cecily Cohen ON 04/29/2023] LORazepam (ATIVAN) tablet 1 mg   Followed by   Cecily Cohen ON 04/30/2023] LORazepam (ATIVAN) tablet 1 mg   Followed by   Cecily Cohen ON 05/01/2023] LORazepam (ATIVAN) tablet 1 mg   thiamine (VITAMIN B1) tablet 100 mg   nicotine (NICODERM CQ - dosed in mg/24 hours) patch 14 mg   albuterol (VENTOLIN HFA) 108 (90 Base) MCG/ACT inhaler 1-2 puff   QUEtiapine (SEROQUEL) tablet 25 mg   disulfiram (ANTABUSE) tablet 250 mg    Nasia Cannan B Carrianne Hyun, MD Psych Resident, PGY-2 04/28/23  1:09 PM .

## 2023-04-28 NOTE — ED Notes (Signed)
 Patient resting quietly in bed with eyes closed, Respirations equal and unlabored, skin warm and dry, no apparent distress noted.  Routine safety checks conducted according to facility protocol. Will continue to monitor and maintain safety.

## 2023-04-28 NOTE — ED Notes (Signed)
 Pt sitting in dayroom watching television and interacting with peers. No acute distress noted. No concerns voiced. Informed pt to notify staff with any needs or assistance. Pt verbalized understanding and agreement. Will continue to monitor for safety.

## 2023-04-28 NOTE — ED Notes (Signed)
 Pt in the bedroom calm and sleeping. NAD. Will continue to monitor for safety.

## 2023-04-29 ENCOUNTER — Other Ambulatory Visit: Payer: Self-pay

## 2023-04-29 DIAGNOSIS — F411 Generalized anxiety disorder: Secondary | ICD-10-CM | POA: Diagnosis not present

## 2023-04-29 DIAGNOSIS — F22 Delusional disorders: Secondary | ICD-10-CM | POA: Diagnosis not present

## 2023-04-29 DIAGNOSIS — F331 Major depressive disorder, recurrent, moderate: Secondary | ICD-10-CM | POA: Diagnosis not present

## 2023-04-29 DIAGNOSIS — F10150 Alcohol abuse with alcohol-induced psychotic disorder with delusions: Secondary | ICD-10-CM | POA: Diagnosis not present

## 2023-04-29 LAB — HEPATIC FUNCTION PANEL
ALT: 95 U/L — ABNORMAL HIGH (ref 0–44)
AST: 94 U/L — ABNORMAL HIGH (ref 15–41)
Albumin: 3.3 g/dL — ABNORMAL LOW (ref 3.5–5.0)
Alkaline Phosphatase: 55 U/L (ref 38–126)
Bilirubin, Direct: 0.2 mg/dL (ref 0.0–0.2)
Indirect Bilirubin: 0.6 mg/dL (ref 0.3–0.9)
Total Bilirubin: 0.8 mg/dL (ref 0.0–1.2)
Total Protein: 7.3 g/dL (ref 6.5–8.1)

## 2023-04-29 LAB — HEPATITIS PANEL, ACUTE
HCV Ab: NONREACTIVE
Hep A IgM: NONREACTIVE
Hep B C IgM: NONREACTIVE
Hepatitis B Surface Ag: NONREACTIVE

## 2023-04-29 LAB — HIV ANTIBODY (ROUTINE TESTING W REFLEX): HIV Screen 4th Generation wRfx: NONREACTIVE

## 2023-04-29 NOTE — ED Notes (Signed)
 Patient observed ambulating on the unit. He is in no acute distress. We will continue to monitor for safety.

## 2023-04-29 NOTE — ED Provider Notes (Signed)
 Behavioral Health Progress Note  Date and Time: 04/29/2023 12:51 PM Name: JDEN WANT MRN:  [id number]  Subjective:  Thomas Vaughn is a 33 y.o. male with a history of MDD and GAD, who presented to [facility] due to SI with plan to cut his wrist and alcohol detox.   Patient seen in the milieu, no acute distress. Patient reports feeling alright today. Patient reports fair sleep, difficulty staying asleep and good appetite. Regarding withdrawal symptoms, he denies. Regarding cravings, he denies. Patient denies current SI, HI, and AVH. He denies adverse effects from starting disulfiram  and Seroquel  yesterday. He reports flank pain and some dysuria in the morning. I discussed obtaining a UA and STI testing to assess further. Regarding discharge plans, he still wants to go to [facility] after detoxing from alcohol.    Diagnosis:  Final diagnoses:  Alcohol use  Moderate episode of recurrent major depressive disorder (HCC)  Paranoid ideation (HCC)    Total Time spent with patient: 20 minutes  Past Psychiatric History:  Diagnoses: AUD, per chart review MDD, GAD Current psychotropic meds: none Previous psychotropic meds: does not recall Psychiatrist/therapist: denies Suicide attempts: Denies   Substance Use History (onset, amount, frequency, most recent use, period of sobriety):  Tobacco: smokes 1 cigar daily Alcohol: drinks 1/5 liquor daily, most recent drink 4/16; denies withdrawal seizures and DT Marijuana: denies Stimulant: denies Opioid: denies Benzo: denies   Rehab history: denies   Past Medical History: Dx:  has a past medical history of ADHD (attention deficit hyperactivity disorder), Anxiety, Asthma, and Depression.  Allergies: Okra  Seizures: denies   Family Psychiatric History: denies   Social History: Housing: lives with girlfriend who is pregnant Occupation: training in a clinical research associate, investment banker, operational in 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 job due to alcohol use Support:  girlfriend, mother Legal: denies  Current Medications:  Current Facility-Administered Medications  Medication Dose Route Frequency Provider Last Rate Last Admin   acetaminophen  (TYLENOL ) tablet 650 mg  650 mg Oral Q6H PRN Trudy Carwin, NP   650 mg at 04/28/23 2110   albuterol  (VENTOLIN  HFA) 108 (90 Base) MCG/ACT inhaler 1-2 puff  1-2 puff Inhalation Q6H PRN Trystan Akhtar B, MD       alum & mag hydroxide-simeth (MAALOX/MYLANTA) 200-200-20 MG/5ML suspension 30 mL  30 mL Oral Q4H PRN Trudy Carwin, NP       disulfiram  (ANTABUSE ) tablet 250 mg  250 mg Oral Daily Ursula Dermody B, MD   250 mg at 04/29/23 0915   hydrOXYzine  (ATARAX ) tablet 25 mg  25 mg Oral Q6H PRN Trudy Carwin, NP   25 mg at 04/27/23 2218   loperamide  (IMODIUM ) capsule 2-4 mg  2-4 mg Oral PRN Trudy Carwin, NP       LORazepam  (ATIVAN ) tablet 1 mg  1 mg Oral Q6H PRN Vallarie Fei B, MD       LORazepam  (ATIVAN ) tablet 1 mg  1 mg Oral TID Olanrewaju Osborn B, MD   1 mg at 04/29/23 0915   Followed by   NOREEN ON 04/30/2023] LORazepam  (ATIVAN ) tablet 1 mg  1 mg Oral BID Rickita Forstner B, MD       Followed by   NOREEN ON 05/01/2023] LORazepam  (ATIVAN ) tablet 1 mg  1 mg Oral Daily Ritisha Deitrick B, MD       multivitamin with minerals tablet 1 tablet  1 tablet Oral Daily Trudy Carwin, NP   1 tablet at 04/29/23 0915   nicotine  (NICODERM CQ  - dosed in  mg/24 hours) patch 14 mg  14 mg Transdermal Daily Janella Rogala B, MD   14 mg at 04/29/23 9083   OLANZapine  (ZYPREXA ) injection 10 mg  10 mg Intramuscular TID PRN Trudy Carwin, NP       OLANZapine  (ZYPREXA ) injection 5 mg  5 mg Intramuscular TID PRN Trudy Carwin, NP       OLANZapine  zydis (ZYPREXA ) disintegrating tablet 5 mg  5 mg Oral TID PRN Trudy Carwin, NP   5 mg at 04/27/23 2218   ondansetron  (ZOFRAN -ODT) disintegrating tablet 4 mg  4 mg Oral Q6H PRN Trudy Carwin, NP       QUEtiapine  (SEROQUEL ) tablet 25 mg  25 mg Oral QHS Libra Gatz B, MD   25 mg at 04/28/23 2111   thiamine   (VITAMIN B1) injection 100 mg  100 mg Intramuscular Once Trudy Carwin, NP       thiamine  (VITAMIN B1) tablet 100 mg  100 mg Oral Daily Ariv Penrod B, MD   100 mg at 04/29/23 9083   No current outpatient medications on file.    Labs  Lab Results:  Admission on 04/27/2023  Component Date Value Ref Range Status   Free T4 04/27/2023 0.70  0.61 - 1.12 ng/dL Final   Comment: (NOTE) Biotin ingestion may interfere with free T4 tests. If the results are inconsistent with the TSH level, previous test results, or the clinical presentation, then consider biotin interference. If needed, order repeat testing after stopping biotin. Performed at [facility], [address], [place], [place] [address]    Total Protein 04/29/2023 7.3  6.5 - 8.1 g/dL Final   Albumin 95/81/7974 3.3 (L)  3.5 - 5.0 g/dL Final   AST 95/81/7974 94 (H)  15 - 41 U/L Final   ALT 04/29/2023 95 (H)  0 - 44 U/L Final   Alkaline Phosphatase 04/29/2023 55  38 - 126 U/L Final   Total Bilirubin 04/29/2023 0.8  0.0 - 1.2 mg/dL Final   Bilirubin, Direct 04/29/2023 0.2  0.0 - 0.2 mg/dL Final   Indirect Bilirubin 04/29/2023 0.6  0.3 - 0.9 mg/dL Final   Performed at [facility], [address], [place], [place] [address]  Admission on 04/27/2023, Discharged on 04/27/2023  Component Date Value Ref Range Status   WBC 04/27/2023 4.5  4.0 - 10.5 K/uL Final   RBC 04/27/2023 4.98  4.22 - 5.81 MIL/uL Final   Hemoglobin 04/27/2023 15.7  13.0 - 17.0 g/dL Final   HCT 95/83/7974 45.5  39.0 - 52.0 % Final   MCV 04/27/2023 91.4  80.0 - 100.0 fL Final   MCH 04/27/2023 31.5  26.0 - 34.0 pg Final   MCHC 04/27/2023 34.5  30.0 - 36.0 g/dL Final   RDW 95/83/7974 12.6  11.5 - 15.5 % Final   Platelets 04/27/2023 137 (L)  150 - 400 K/uL Final   nRBC 04/27/2023 0.0  0.0 - 0.2 % Final   Neutrophils Relative % 04/27/2023 36  % Final   Neutro Abs 04/27/2023 1.6 (L)  1.7 - 7.7 K/uL Final   Lymphocytes Relative 04/27/2023 55  % Final    Lymphs Abs 04/27/2023 2.5  0.7 - 4.0 K/uL Final   Monocytes Relative 04/27/2023 7  % Final   Monocytes Absolute 04/27/2023 0.3  0.1 - 1.0 K/uL Final   Eosinophils Relative 04/27/2023 2  % Final   Eosinophils Absolute 04/27/2023 0.1  0.0 - 0.5 K/uL Final   Basophils Relative 04/27/2023 0  % Final  Basophils Absolute 04/27/2023 0.0  0.0 - 0.1 K/uL Final   Immature Granulocytes 04/27/2023 0  % Final   Abs Immature Granulocytes 04/27/2023 0.01  0.00 - 0.07 K/uL Final   Performed at [facility], [address], [place], [place] [address]   Sodium 04/27/2023 141  135 - 145 mmol/L Final   Potassium 04/27/2023 3.7  3.5 - 5.1 mmol/L Final   Chloride 04/27/2023 104  98 - 111 mmol/L Final   CO2 04/27/2023 20 (L)  22 - 32 mmol/L Final   Glucose, Bld 04/27/2023 102 (H)  70 - 99 mg/dL Final   Glucose reference range applies only to samples taken after fasting for at least 8 hours.   BUN 04/27/2023 6  6 - 20 mg/dL Final   Creatinine, Ser 04/27/2023 0.78  0.61 - 1.24 mg/dL Final   Calcium 95/83/7974 8.9  8.9 - 10.3 mg/dL Final   Total Protein 95/83/7974 7.8  6.5 - 8.1 g/dL Final   Albumin 95/83/7974 4.1  3.5 - 5.0 g/dL Final   AST 95/83/7974 195 (H)  15 - 41 U/L Final   ALT 04/27/2023 144 (H)  0 - 44 U/L Final   Alkaline Phosphatase 04/27/2023 60  38 - 126 U/L Final   Total Bilirubin 04/27/2023 0.4  0.0 - 1.2 mg/dL Final   GFR, Estimated 04/27/2023 >60  >60 mL/min Final   Comment: (NOTE) Calculated using the CKD-EPI Creatinine Equation (2021)    Anion gap 04/27/2023 17 (H)  5 - 15 Final   Performed at [facility], [address], [place], [place] [address]   Hgb A1c MFr Bld 04/27/2023 5.2  4.8 - 5.6 % Final   Comment: (NOTE) Pre diabetes:          5.7%-6.4%  Diabetes:              >6.4%  Glycemic control for   <7.0% adults with diabetes    Mean Plasma Glucose 04/27/2023 102.54  mg/dL Final   Performed at [facility], [address] [place], [place] [address]    Alcohol, Ethyl (B) 04/27/2023 285 (H)  <10 mg/dL Final   Comment: (NOTE) For medical purposes only. Performed at [facility], [address], [place], [place] [address]    Cholesterol 04/27/2023 200  0 - 200 mg/dL Final   Triglycerides 95/83/7974 93  <150 mg/dL Final   HDL 95/83/7974 113  >40 mg/dL Final   Total CHOL/HDL Ratio 04/27/2023 1.8  RATIO Final   VLDL 04/27/2023 19  0 - 40 mg/dL Final   LDL Cholesterol 04/27/2023 68  0 - 99 mg/dL Final   Comment:        Total Cholesterol/HDL:CHD Risk Coronary Heart Disease Risk Table                     Men   Women  1/2 Average Risk   3.4   3.3  Average Risk       5.0   4.4  2 X Average Risk   9.6   7.1  3 X Average Risk  23.4   11.0        Use the calculated Patient Ratio above and the CHD Risk Table to determine the patient's CHD Risk.        ATP III CLASSIFICATION (LDL):  <100     mg/dL   Optimal  899-870  mg/dL   Near or Above  Optimal  130-159  mg/dL   Borderline  839-810  mg/dL   High  >809     mg/dL   Very High Performed at [facility], [address] [place], [place] [address]    TSH 04/27/2023 0.202 (L)  0.350 - 4.500 uIU/mL Final   Comment: Performed by a 3rd Generation assay with a functional sensitivity of <=0.01 uIU/mL. Performed at [facility], [address] [place], [place] [address]    POC Amphetamine UR 04/27/2023 None Detected  NONE DETECTED (Cut Off Level 1000 ng/mL) Final   POC Secobarbital (BAR) 04/27/2023 None Detected  NONE DETECTED (Cut Off Level 300 ng/mL) Final   POC Buprenorphine (BUP) 04/27/2023 None Detected  NONE DETECTED (Cut Off Level 10 ng/mL) Final   POC Oxazepam (BZO) 04/27/2023 None Detected  NONE DETECTED (Cut Off Level 300 ng/mL) Final   POC Cocaine UR 04/27/2023 None Detected  NONE DETECTED (Cut Off Level 300 ng/mL) Final   POC Methamphetamine UR 04/27/2023 None Detected  NONE DETECTED (Cut Off Level 1000 ng/mL) Final   POC Morphine  04/27/2023 None  Detected  NONE DETECTED (Cut Off Level 300 ng/mL) Final   POC Methadone UR 04/27/2023 None Detected  NONE DETECTED (Cut Off Level 300 ng/mL) Final   POC Oxycodone UR 04/27/2023 None Detected  NONE DETECTED (Cut Off Level 100 ng/mL) Final   POC Marijuana UR 04/27/2023 None Detected  NONE DETECTED (Cut Off Level 50 ng/mL) Final    Blood Alcohol level:  Lab Results  Component Value Date   ETH 285 (H) 04/27/2023   ETH 199 (H) 02/08/2022    Metabolic Disorder Labs: Lab Results  Component Value Date   HGBA1C 5.2 04/27/2023   MPG 102.54 04/27/2023   MPG 99.67 02/08/2022   No results found for: PROLACTIN Lab Results  Component Value Date   CHOL 200 04/27/2023   TRIG 93 04/27/2023   HDL 113 04/27/2023   CHOLHDL 1.8 04/27/2023   VLDL 19 04/27/2023   LDLCALC 68 04/27/2023   LDLCALC 69 02/08/2022    Therapeutic Lab Levels: No results found for: LITHIUM No results found for: VALPROATE No results found for: CBMZ  Physical Findings   AIMS    Flowsheet Row [facility]  AIMS Total Score 0      AUDIT    Flowsheet Row [facility]  Alcohol Use Disorder Identification Test Final Score (AUDIT) 36      PHQ2-9    Flowsheet Row ED from 04/27/2023 in [facility] ED from 02/13/2022 in [facility]  PHQ-2 Total Score 0 2  PHQ-9 Total Score -- 5      Flowsheet Row ED from 04/27/2023 in [facility] ED from 03/16/2022 in [facility] ED from 02/13/2022 in [facility]  C-SSRS RISK CATEGORY High Risk No Risk No Risk        Musculoskeletal  Strength & Muscle Tone: within normal limits Gait & Station: normal Patient leans: N/A  Psychiatric Specialty Exam  Presentation  General Appearance:   Appropriate for Environment; Fairly Groomed  Eye Contact: Fair  Speech: Clear and Coherent; Normal Rate  Speech Volume: Normal  Handedness: Right   Mood and Affect  Mood: Euthymic  Affect: Congruent; Appropriate   Thought Process  Thought Processes: Coherent  Descriptions of Associations:Intact  Orientation:Full (Time, Place and Person)  Thought Content:Logical  Diagnosis of Schizophrenia or Schizoaffective disorder in past: No  Duration of Psychotic Symptoms: N/A   Hallucinations:Hallucinations: None  Ideas of Reference:None  Suicidal Thoughts:Suicidal Thoughts: No  Homicidal Thoughts:Homicidal Thoughts: No   Sensorium  Memory: Remote Good  Judgment: Fair  Insight: Fair   Art Therapist  Concentration: Good  Attention Span: Good  Recall: Good  Fund of Knowledge: Good  Language: Good   Psychomotor Activity  Psychomotor Activity: Psychomotor Activity: Normal   Assets  Assets: Communication Skills; Resilience; Social Support   Sleep  Sleep: Sleep: Good   Physical Exam  Physical Exam Vitals reviewed.  Constitutional:      Appearance: Normal appearance.  HENT:     Head: Normocephalic and atraumatic.  Cardiovascular:     Rate and Rhythm: Normal rate.  Pulmonary:     Effort: Pulmonary effort is normal.  Neurological:     General: No focal deficit present.     Mental Status: He is alert and oriented to person, place, and time.    Review of Systems  Constitutional:  Negative for chills and fever.  Respiratory:  Negative for shortness of breath.   Cardiovascular:  Negative for chest pain and palpitations.  Gastrointestinal:  Negative for nausea and vomiting.  Genitourinary:  Positive for dysuria and flank pain.  Neurological:  Negative for headaches.   Blood pressure 134/87, pulse 87, temperature 98.1 F (36.7 C), temperature source Oral, resp. rate 18, SpO2 99%. There is no height or weight on file to  calculate BMI.  Treatment Plan Summary:  Alcohol Use Disorder EtOH level: 285; LFTs: AST 195>94, ALT 144>95; last use date: 4/16 -Ativan  taper -CIWA with Ativan  as needed for CIWA greater than 10             -Last CIWA score is 0 on 4/18 at 12 PM -Continue disulfiram  250 mg daily -Thiamine  100 mg IM first day and PO after that -Multivitamin with minerals daily -Tylenol  650 mg every 6 hours as needed for pain -Zofran  4 mg every 6 hours as needed for nausea or vomiting -Imodium  2 to 4 mg as needed for diarrhea or loose stools  -Maalox/Mylanta 30 mL every 4 hours as needed for indigestion -Milk of Mag 30 mL as needed for constipation    Substance induced psychosis (paranoia) -Continue Seroquel  25 mg at bedtime              A1c 5.2, EtOH 285, lipid panel WNL, EKG qtc 461              Recommendations  Based on my evaluation the patient does not appear to have an emergency medical condition.   Monitoring: Pending UA, RPR, HIV, g/c,  UDS normal, CBC unremarkable, CMP-AST 195, ALT 144, A1c 5.2, EtOH 285, lipid panel WNL, TSH 0.202, normal T3 and T4  Dispo: home after detox with [facility] on 4/23  Ismael KATHEE Franco, MD 04/29/2023 12:51 PM

## 2023-04-29 NOTE — Group Note (Signed)
 Group Topic: Decisional Balance/Substance Abuse  Group Date: 04/29/2023 Start Time: 1830 End Time: 1900 Facilitators: Alvino Joseph, NT  Department: [facility]  Number of Participants: 6  Group Focus: coping skills Treatment Modality:  Patient-Centered Therapy Interventions utilized were group exercise Purpose: enhance coping skills  Name: Thomas Vaughn Mention Date of Birth: November 12, 1990  MR: [id number]    Level of Participation: moderate Quality of Participation: cooperative Interactions with others: gave feedback Mood/Affect: appropriate Triggers (if applicable): n/a Cognition: coherent/clear Progress: Moderate Response: coping- go for a walk,go hiking in nature,read a book,go for a bicycle ride, call a friend, do a craft,play with a pet, listen to music, cook or bake, go swimming,rearrange a room Plan: follow-up needed  Patients Problems:  Patient Active Problem List   Diagnosis Date Noted   Alcohol abuse 04/16/2017   Bipolar disorder, unspecified (HCC) 04/15/2017   ADHD (attention deficit hyperactivity disorder)    HYPOKALEMIA 07/29/2009   ANEMIA OF OTHER CHRONIC DISEASE 07/29/2009   GYNECOMASTIA 07/29/2009   CHEST PAIN UNSPECIFIED 07/29/2009

## 2023-04-29 NOTE — ED Notes (Signed)
 Patient in the Dayroom with other patients watching TV, NAD,  Calm and composed, no complaints or concerns. Respirations even and unlabored. Will monitor for safety.

## 2023-04-29 NOTE — ED Notes (Signed)
Lunch served

## 2023-04-29 NOTE — ED Notes (Signed)
 Patient is in the bedroom calm and sleeping. Denies needing anything at this time. Respirations are even and unlabored. Will monitor for safety.

## 2023-04-29 NOTE — ED Notes (Signed)
 Pt requesting to be discharged. 72hr form filled out by pt. Provider notified

## 2023-04-29 NOTE — Group Note (Signed)
 Group Topic: Wellness  Group Date: 04/29/2023 Start Time: 1000 End Time: 1030 Facilitators: Tanda Ogren D, NT  Department: [facility]  Number of Participants: 6  Group Focus: individual meeting, problem solving, relapse prevention, self-awareness, substance abuse education, and suicide prevention skills Treatment Modality:  Psychoeducation Interventions utilized were support Purpose: increase insight  Name: Thomas Vaughn Date of Birth: November 13, 1990  MR: [id number]    Level of Participation: moderate Quality of Participation: attentive Interactions with others: gave feedback Mood/Affect: appropriate Triggers (if applicable): N/A Cognition: concrete Progress: Significant Response: N/A Plan: follow-up needed  Patients Problems:  Patient Active Problem List   Diagnosis Date Noted   Alcohol abuse 04/16/2017   Bipolar disorder, unspecified (HCC) 04/15/2017   ADHD (attention deficit hyperactivity disorder)    HYPOKALEMIA 07/29/2009   ANEMIA OF OTHER CHRONIC DISEASE 07/29/2009   GYNECOMASTIA 07/29/2009   CHEST PAIN UNSPECIFIED 07/29/2009

## 2023-04-29 NOTE — ED Notes (Signed)
Breakfast served

## 2023-04-29 NOTE — ED Notes (Signed)
 Patient A&Ox4. Has been calm, cooperative, and appropriate with peers. Patient has taken his medications without issues. He denies intent to harm self/others. Denies A/VH. Patient denies any physical pain or discomfort. No acute distress observed. Routine safety checks conducted according to facility protocol. Patient agreed to notify staff should thoughts of harm toward self or others arise. We will continue to monitor for safety.

## 2023-04-30 DIAGNOSIS — F10150 Alcohol abuse with alcohol-induced psychotic disorder with delusions: Secondary | ICD-10-CM | POA: Diagnosis not present

## 2023-04-30 DIAGNOSIS — F22 Delusional disorders: Secondary | ICD-10-CM | POA: Diagnosis not present

## 2023-04-30 DIAGNOSIS — F411 Generalized anxiety disorder: Secondary | ICD-10-CM | POA: Diagnosis not present

## 2023-04-30 DIAGNOSIS — F331 Major depressive disorder, recurrent, moderate: Secondary | ICD-10-CM | POA: Diagnosis not present

## 2023-04-30 LAB — T3, FREE: T3, Free: 2.5 pg/mL (ref 2.0–4.4)

## 2023-04-30 LAB — RPR: RPR Ser Ql: NONREACTIVE

## 2023-04-30 MED ORDER — DISULFIRAM 250 MG PO TABS
250.0000 mg | ORAL_TABLET | Freq: Every day | ORAL | 0 refills | Status: AC
Start: 2023-05-01 — End: 2023-05-15

## 2023-04-30 MED ORDER — VITAMIN B-1 100 MG PO TABS: 100.0000 mg | ORAL_TABLET | Freq: Every day | ORAL | 0 refills | Status: AC

## 2023-04-30 MED ORDER — QUETIAPINE FUMARATE 25 MG PO TABS: 25.0000 mg | ORAL_TABLET | Freq: Every day | ORAL | 0 refills | Status: DC

## 2023-04-30 MED ORDER — NICOTINE 14 MG/24HR TD PT24: 14.0000 mg | MEDICATED_PATCH | Freq: Every day | TRANSDERMAL | 0 refills | Status: AC

## 2023-04-30 MED ORDER — HYDROXYZINE HCL 25 MG PO TABS: 25.0000 mg | ORAL_TABLET | Freq: Three times a day (TID) | ORAL | 0 refills | Status: DC | PRN

## 2023-04-30 NOTE — ED Provider Notes (Signed)
 FBC/OBS ASAP Discharge Summary  Date and Time: 04/30/2023 11:14 AM  Name: Thomas Vaughn  MRN:  [id number]   Discharge Diagnoses:  Final diagnoses:  Alcohol use  Moderate episode of recurrent major depressive disorder (HCC)  Paranoid ideation (HCC)    Subjective: Thomas Vaughn is a 33 y.o. male with a history of MDD and GAD, who presented to [facility] due to SI with plan to cut his wrist and alcohol detox.   Stay Summary: The patient was evaluated each day by a clinical provider to ascertain response to treatment. Improvement was noted by the patient's report of decreasing symptoms, improved sleep and appetite, affect, medication tolerance, behavior, and participation in unit programming.  Patient was asked each day to complete a self inventory noting mood, mental status, pain, new symptoms, anxiety and concerns.   Patient responded well to medication and being in a therapeutic and supportive environment. Positive and appropriate behavior was noted and the patient was motivated for recovery. The patient worked closely with the treatment team and case manager to develop a discharge plan with appropriate goals. Coping skills, problem solving as well as relaxation therapies were also part of the unit programming.   On day of discharge patient was in much improved condition than upon admission.  Symptoms were reported as significantly decreased or resolved completely. The patient denied SI/HI and voiced no AVH. The patient was motivated to continue taking medication with a goal of continued improvement in mental health. He will be starting CDIOP on Wednesday. He is motivated to continue taking medications to bridge.   Collateral: Girlfriend, Clydell Darnel [phone])- Called and safety planning completed. No concerns about patient discharging, as she states he will have a lot of familial support present. She is advised to lock away all sharps, guns/weapons, if present (she confirmed there are no guns),  and any alcohol. She confirmed these measures have already been taken. She is provided with criteria to return to [facility]/ED, call [phone] or 911, and she is appreciative. She denies further questions or concerns.   Total Time spent with patient: 45 minutes  Past Psychiatric History:  Diagnoses: AUD, per chart review MDD, GAD Current psychotropic meds: none Previous psychotropic meds: does not recall Psychiatrist/therapist: denies Suicide attempts: Denies   Substance Use History (onset, amount, frequency, most recent use, period of sobriety):  Tobacco: smokes 1 cigar daily Alcohol: drinks 1/5 liquor daily, most recent drink 4/16; denies withdrawal seizures and DT Marijuana: denies Stimulant: denies Opioid: denies Benzo: denies   Rehab history: denies   Past Medical History: Dx:  has a past medical history of ADHD (attention deficit hyperactivity disorder), Anxiety, Asthma, and Depression.  Allergies: Okra  Seizures: denies   Family Psychiatric History: denies   Social History: Housing: lives with girlfriend who is pregnant Occupation: training in a Clinical research associate, Investment banker, operational in [employer] but recently lsot his job due to alcohol use Support: girlfriend, mother Legal: denies  Tobacco Cessation:  A prescription for an FDA-approved tobacco cessation medication provided at discharge  Current Medications:  Current Facility-Administered Medications  Medication Dose Route Frequency Provider Last Rate Last Admin   acetaminophen  (TYLENOL ) tablet 650 mg  650 mg Oral Q6H PRN Dorthea Gauze, NP   650 mg at 04/28/23 2110   albuterol  (VENTOLIN  HFA) 108 (90 Base) MCG/ACT inhaler 1-2 puff  1-2 puff Inhalation Q6H PRN Hoang, Daniela B, MD       alum & mag hydroxide-simeth (MAALOX/MYLANTA) 200-200-20 MG/5ML suspension 30 mL  30 mL Oral Q4H  PRN Dorthea Gauze, NP       disulfiram  (ANTABUSE ) tablet 250 mg  250 mg Oral Daily Hoang, Daniela B, MD   250 mg at 04/30/23 1000   hydrOXYzine  (ATARAX ) tablet  25 mg  25 mg Oral Q6H PRN Dorthea Gauze, NP   25 mg at 04/29/23 1444   loperamide  (IMODIUM ) capsule 2-4 mg  2-4 mg Oral PRN Dorthea Gauze, NP       LORazepam  (ATIVAN ) tablet 1 mg  1 mg Oral Q6H PRN Hoang, Daniela B, MD       LORazepam  (ATIVAN ) tablet 1 mg  1 mg Oral BID Hoang, Daniela B, MD   1 mg at 04/30/23 1000   Followed by   Cecily Cohen ON 05/01/2023] LORazepam  (ATIVAN ) tablet 1 mg  1 mg Oral Daily Hoang, Daniela B, MD       multivitamin with minerals tablet 1 tablet  1 tablet Oral Daily Dorthea Gauze, NP   1 tablet at 04/30/23 1000   nicotine  (NICODERM CQ  - dosed in mg/24 hours) patch 14 mg  14 mg Transdermal Daily Hoang, Daniela B, MD   14 mg at 04/30/23 1002   OLANZapine  (ZYPREXA ) injection 10 mg  10 mg Intramuscular TID PRN Dorthea Gauze, NP       OLANZapine  (ZYPREXA ) injection 5 mg  5 mg Intramuscular TID PRN Dorthea Gauze, NP       OLANZapine  zydis (ZYPREXA ) disintegrating tablet 5 mg  5 mg Oral TID PRN Dorthea Gauze, NP   5 mg at 04/27/23 2218   ondansetron  (ZOFRAN -ODT) disintegrating tablet 4 mg  4 mg Oral Q6H PRN Dorthea Gauze, NP       QUEtiapine  (SEROQUEL ) tablet 25 mg  25 mg Oral QHS Hoang, Daniela B, MD   25 mg at 04/29/23 2116   thiamine  (VITAMIN B1) injection 100 mg  100 mg Intramuscular Once Dorthea Gauze, NP       thiamine  (VITAMIN B1) tablet 100 mg  100 mg Oral Daily Hoang, Daniela B, MD   100 mg at 04/30/23 1000   No current outpatient medications on file.    PTA Medications:  Facility Ordered Medications  Medication   acetaminophen  (TYLENOL ) tablet 650 mg   alum & mag hydroxide-simeth (MAALOX/MYLANTA) 200-200-20 MG/5ML suspension 30 mL   thiamine  (VITAMIN B1) injection 100 mg   multivitamin with minerals tablet 1 tablet   hydrOXYzine  (ATARAX ) tablet 25 mg   loperamide  (IMODIUM ) capsule 2-4 mg   ondansetron  (ZOFRAN -ODT) disintegrating tablet 4 mg   OLANZapine  zydis (ZYPREXA ) disintegrating tablet 5 mg   OLANZapine  (ZYPREXA ) injection 5 mg   OLANZapine  (ZYPREXA )  injection 10 mg   LORazepam  (ATIVAN ) tablet 1 mg   [COMPLETED] LORazepam  (ATIVAN ) tablet 1 mg   Followed by   [COMPLETED] LORazepam  (ATIVAN ) tablet 1 mg   Followed by   LORazepam  (ATIVAN ) tablet 1 mg   Followed by   Cecily Cohen ON 05/01/2023] LORazepam  (ATIVAN ) tablet 1 mg   thiamine  (VITAMIN B1) tablet 100 mg   nicotine  (NICODERM CQ  - dosed in mg/24 hours) patch 14 mg   albuterol  (VENTOLIN  HFA) 108 (90 Base) MCG/ACT inhaler 1-2 puff   QUEtiapine  (SEROQUEL ) tablet 25 mg   disulfiram  (ANTABUSE ) tablet 250 mg       04/29/2023    9:08 AM 02/13/2022   12:59 AM  Depression screen PHQ 2/9  Decreased Interest 0 1  Down, Depressed, Hopeless 0 1  PHQ - 2 Score 0 2  Altered sleeping  1  Tired, decreased energy  0  Change in appetite  1  Feeling bad or failure about yourself   1  Trouble concentrating  0  Moving slowly or fidgety/restless  0  Suicidal thoughts  0  PHQ-9 Score  5  Difficult doing work/chores  Somewhat difficult    Flowsheet Row ED from [facility]/27/20[facility] in [facility] ED from 03/16/2022 in [facility] ED from 02/13/2022 in [facility]  C-SSRS RISK CATEGORY High Risk No Risk No Risk       Musculoskeletal  Strength & Muscle Tone: within normal limits Gait & Station: normal Patient leans: N/A  Psychiatric Specialty Exam  Presentation  General Appearance:  Appropriate for Environment; Fairly Groomed  Eye Contact: Fair  Speech: Clear and Coherent; Normal Rate  Speech Volume: Normal  Handedness: Right   Mood and Affect  Mood: Euthymic  Affect: Congruent; Appropriate   Thought Process  Thought Processes: Coherent  Descriptions of Associations:Intact  Orientation:Full (Time, Place and Person)  Thought Content:Logical  Diagnosis of Schizophrenia or Schizoaffective disorder in past: No    Hallucinations:Hallucinations: None  Ideas of Reference:None  Suicidal  Thoughts:Suicidal Thoughts: No  Homicidal Thoughts:Homicidal Thoughts: No   Sensorium  Memory: Remote Good  Judgment: Fair  Insight: Fair   Art therapist  Concentration: Good  Attention Span: Good  Recall: Good  Fund of Knowledge: Good  Language: Good   Psychomotor Activity  Psychomotor Activity: Psychomotor Activity: Normal   Assets  Assets: Communication Skills; Resilience; Social Support   Sleep  Sleep: Sleep: Good   No data recorded  Physical Exam  Physical Exam Vitals reviewed.  Constitutional:      General: He is not in acute distress.    Appearance: He is not toxic-appearing.  HENT:     Head: Normocephalic and atraumatic.  Pulmonary:     Effort: Pulmonary effort is normal.  Skin:    General: Skin is warm and dry.  Neurological:     General: No focal deficit present.     Mental Status: He is alert.     Gait: Gait normal.    Review of Systems  Constitutional:  Negative for diaphoresis and malaise/fatigue.  Cardiovascular:  Negative for chest pain.  Gastrointestinal: Negative.   Musculoskeletal:  Negative for myalgias.  Neurological:  Negative for dizziness, tremors and headaches.   Blood pressure (!) 140/86, pulse 70, temperature 98 F (36.7 C), temperature source Oral, resp. rate 16, SpO2 100%. There is no height or weight on file to calculate BMI.  Demographic Factors:  Male and Unemployed  Loss Factors: Decrease in vocational status and Financial problems/change in socioeconomic status  Historical Factors: Impulsivity  Risk Reduction Factors:   Responsible for children under 60 years of age, Sense of responsibility to family, Living with another person, especially a relative, Positive social support, and Positive coping skills or problem solving skills  Continued Clinical Symptoms:  Alcohol/Substance Abuse/Dependencies  Cognitive Features That Contribute To Risk:  None    Suicide Risk:  Mild: There are no  identifiable plans, no associated intent, mild dysphoria and related symptoms, good self-control (both objective and subjective assessment), few other risk factors, and identifiable protective factors, including available and accessible social support.  Plan Of Care/Follow-up recommendations:  Follow-up recommendations:  Activity:  Normal, as tolerated Diet:  Per PCP recommendation  Patient is instructed prior to discharge to: Take all medications as prescribed by his mental healthcare provider. Report any adverse effects and/or reactions from the medicines to his  outpatient provider promptly. Patient has been instructed & cautioned: To not engage in alcohol and or illegal drug use while on prescription medicines.  In the event of worsening symptoms, patient is instructed to call the crisis hotline at 988, 911 and or go to the nearest ED for appropriate evaluation and treatment of symptoms. To follow-up with his primary care provider for your other medical issues, concerns and or health care needs.   Disposition: Home with girlfriend. CDIOP on 4/23 at 9 AM.   Shery Done, MD 04/30/2023, 11:14 AM

## 2023-04-30 NOTE — ED Notes (Signed)
 Patient A&O x 4, ambulatory. Patient discharged in no acute distress. Patient denied SI/HI, A/VH upon discharge. Patient verbalized understanding of all discharge instructions reviewed on AVS via staff, to include follow up appointments, RX's and safety. Suicide safety plan completed and reviewed with Clinical research associate. A copy given to pt. Patient reported mood 10/10.  Pt belongings returned to patient from locker #30 intact. Patient escorted to lobby via staff for transport to home via his girlfriend. Safety maintained.

## 2023-04-30 NOTE — ED Notes (Signed)
 Patient is in the bedroom calm and sleeping. NAD. Respirations are even and unlabored. Will monitor for safety.

## 2023-04-30 NOTE — ED Notes (Signed)
 Patient A&Ox4. Denies intent to harm self/others when asked. Denies A/VH. Patient denies any physical complaints when asked. No acute distress noted. Pt requesting to discharge today. Provider made aware via secure chat. Routine safety checks conducted according to facility protocol. Encouraged patient to notify staff if thoughts of harm toward self or others arise. Patient verbalize understanding and agreement. Will continue to monitor for safety.

## 2023-05-01 LAB — URINALYSIS, ROUTINE W REFLEX MICROSCOPIC
Bacteria, UA: NONE SEEN
Bilirubin Urine: NEGATIVE
Glucose, UA: NEGATIVE mg/dL
Hgb urine dipstick: NEGATIVE
Ketones, ur: NEGATIVE mg/dL
Nitrite: NEGATIVE
Protein, ur: 30 mg/dL — AB
Specific Gravity, Urine: 1.023 (ref 1.005–1.030)
pH: 7 (ref 5.0–8.0)

## 2023-05-02 LAB — GC/CHLAMYDIA PROBE AMP (~~LOC~~) NOT AT ARMC
Chlamydia: NEGATIVE
Comment: NEGATIVE
Comment: NORMAL
Neisseria Gonorrhea: NEGATIVE

## 2023-05-04 ENCOUNTER — Ambulatory Visit (HOSPITAL_COMMUNITY)

## 2023-05-05 ENCOUNTER — Encounter (HOSPITAL_COMMUNITY): Payer: Self-pay

## 2023-05-05 ENCOUNTER — Telehealth (HOSPITAL_COMMUNITY): Payer: Self-pay

## 2023-05-05 NOTE — Telephone Encounter (Signed)
 Therapist outreaches Thomas Vaughn by phone as he no showed for his CCA for SAIOP yesterday.  The phone rang and the message said the voice mail has not been set up. Therapist will send a letter to [employer].  Earnie Gola, MS, LMFT, LCAS 05-05-23

## 2023-05-10 ENCOUNTER — Telehealth: Admitting: Physician Assistant

## 2023-05-10 ENCOUNTER — Encounter: Payer: Self-pay | Admitting: Physician Assistant

## 2023-05-10 DIAGNOSIS — F1010 Alcohol abuse, uncomplicated: Secondary | ICD-10-CM

## 2023-05-10 DIAGNOSIS — B360 Pityriasis versicolor: Secondary | ICD-10-CM | POA: Diagnosis not present

## 2023-05-10 DIAGNOSIS — R748 Abnormal levels of other serum enzymes: Secondary | ICD-10-CM | POA: Diagnosis not present

## 2023-05-10 DIAGNOSIS — R8290 Unspecified abnormal findings in urine: Secondary | ICD-10-CM | POA: Diagnosis not present

## 2023-05-10 MED ORDER — KETOCONAZOLE 2 % EX CREA: 1.0000 | TOPICAL_CREAM | Freq: Every day | CUTANEOUS | 0 refills | Status: DC

## 2023-05-10 NOTE — Patient Instructions (Signed)
  Aldrin Gwyneth Lesch, thank you for joining Hyla Maillard, PA-C for today's virtual visit.  While this provider is not your primary care provider (PCP), if your PCP is located in our provider database this encounter information will be shared with them immediately following your visit.   A [facility] MyChart account gives you access to today's visit and all your visits, tests, and labs performed at [facility] " click here if you don't have a [facility] MyChart account or go to [email or web address]  Consent: (Patient) Matai L Balthazor provided verbal consent for this virtual visit at the beginning of the encounter.  Current Medications:  Current Outpatient Medications:    disulfiram  (ANTABUSE ) 250 MG tablet, Take 1 tablet (250 mg total) by mouth daily for 14 days., Disp: 14 tablet, Rfl: 0   hydrOXYzine  (ATARAX ) 25 MG tablet, Take 1 tablet (25 mg total) by mouth 3 (three) times daily as needed for anxiety., Disp: 30 tablet, Rfl: 0   nicotine  (NICODERM CQ  - DOSED IN MG/24 HOURS) 14 mg/24hr patch, Place 1 patch (14 mg total) onto the skin daily., Disp: 28 patch, Rfl: 0   QUEtiapine  (SEROQUEL ) 25 MG tablet, Take 1 tablet (25 mg total) by mouth at bedtime., Disp: 14 tablet, Rfl: 0   thiamine  (VITAMIN B-1) 100 MG tablet, Take 1 tablet (100 mg total) by mouth daily., Disp: 30 tablet, Rfl: 0   Medications ordered in this encounter:  No orders of the defined types were placed in this encounter.    *If you need refills on other medications prior to your next appointment, please contact your pharmacy*  Follow-Up: Call back or seek an in-person evaluation if the symptoms worsen or if the condition fails to improve as anticipated.  [facility] [phone]  Other Instructions Please continue to hydrate and follow-up with your specialist regarding ongoing alcohol cessation efforts.  For the abnormal labs, these will need follow-up. I want you to be evaluated at  one of our in-person urgent cares within next 2-3 weeks if you cannot get in with a new PCP in that time.   For the rash, apply the Ketoconazole cream as directed. Consider use of OTC Nizoral shampoo as well to the area twice weekly.  If not resolving, please let us  know.    If you have been instructed to have an in-person evaluation today at a local Urgent Care facility, please use the link below. It will take you to a list of all of our available [facility], including address, phone number and hours of operation. Please do not delay care.  [facility]  If you or a family member do not have a primary care provider, use the link below to schedule a visit and establish care. When you choose a [facility] primary care physician or advanced practice provider, you gain a long-term partner in health. Find a Primary Care Provider I recommend the [facility] group in [place] -- multiple locations.    Learn more about [facility]'s in-office and virtual care options:  - Get Care Now

## 2023-05-10 NOTE — Progress Notes (Signed)
 Virtual Visit Consent   Thomas Vaughn, you are scheduled for a virtual visit with a [facility] provider today. Just as with appointments in the office, your consent must be obtained to participate. Your consent will be active for this visit and any virtual visit you may have with one of our providers in the next 365 days. If you have a MyChart account, a copy of this consent can be sent to you electronically.  As this is a virtual visit, video technology does not allow for your provider to perform a traditional examination. This may limit your provider's ability to fully assess your condition. If your provider identifies any concerns that need to be evaluated in person or the need to arrange testing (such as labs, EKG, etc.), we will make arrangements to do so. Although advances in technology are sophisticated, we cannot ensure that it will always work on either your end or our end. If the connection with a video visit is poor, the visit may have to be switched to a telephone visit. With either a video or telephone visit, we are not always able to ensure that we have a secure connection.  By engaging in this virtual visit, you consent to the provision of healthcare and authorize for your insurance to be billed (if applicable) for the services provided during this visit. Depending on your insurance coverage, you may receive a charge related to this service.  I need to obtain your verbal consent now. Are you willing to proceed with your visit today? Cayman L Zidek has provided verbal consent on 05/10/2023 for a virtual visit (video or telephone). Hyla Maillard, [place]  Date: 05/10/2023 9:38 AM   Virtual Visit via Video Note   I, Hyla Maillard, connected with  LAQUINTON TOOLE  ([id number], 1990-03-28) on 05/10/23 at  8:30 AM EDT by a video-enabled telemedicine application and verified that I am speaking with the correct person using two identifiers.  Location: Patient: Virtual Visit  Location Patient: Home Provider: Virtual Visit Location Provider: Home Office   I discussed the limitations of evaluation and management by telemedicine and the availability of in person appointments. The patient expressed understanding and agreed to proceed.    History of Present Illness: Thomas Vaughn is a 33 y.o. who identifies as a male who was assigned male at birth, and is being seen today for questions regarding recent lab results during [facility] stay for alcohol treatment.   Also complaining of a rash of skin noted in the crooks of his elbows, upper chest and upper back that sometimes gets red or itchy with showering. 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/2009   CHEST PAIN UNSPECIFIED 07/29/2009    Allergies:  Allergies  Allergen Reactions   Okra Other (See Comments)    Radioactive feeling in my head   Medications:  Current Outpatient Medications:    disulfiram  (ANTABUSE ) 250 MG tablet, Take 1 tablet (250 mg total) by mouth daily for 14 days., Disp: 14 tablet, Rfl: 0   hydrOXYzine  (ATARAX ) 25 MG tablet, Take 1 tablet (25 mg total) by mouth 3 (three) times daily as needed for anxiety., Disp: 30 tablet, Rfl: 0  nicotine  (NICODERM CQ  - DOSED IN MG/24 HOURS) 14 mg/24hr patch, Place 1 patch (14 mg total) onto the skin daily., Disp: 28 patch, Rfl: 0   QUEtiapine  (SEROQUEL ) 25 MG tablet, Take 1 tablet (25 mg total) by mouth at bedtime., Disp: 14 tablet, Rfl: 0   thiamine  (VITAMIN B-1) 100 MG tablet, Take 1 tablet (100 mg total) by mouth daily., Disp: 30 tablet, Rfl: 0  Observations/Objective: Patient is well-developed, well-nourished in no acute distress.  Resting comfortably at home.   Head is normocephalic, atraumatic.  No labored breathing. Speech is clear and coherent with logical content.  Patient is alert and oriented at baseline.    Assessment and Plan: 1. Alcohol abuse (Primary) 2. Elevated liver enzymes  2 weeks sober. Feeling great. Has follow-up scheduled with [facility]. Needs new primary care provider in the area (moved from [place], [place]). Resources sent to help get this set up. Congratulated on his cessation and continued efforts. Discussed liver enzyme elevation in relation to alcohol use. Will need follow-up to see if resolving and will also need some further imaging of liver, etc.   3. Abnormal urinalysis  + LE, WBC and struvite crystals. No history of stone prior or history of pyelonephritis. No active symptoms. Needs follow-up testing and if remains positive, culture and possible imaging. Will need in person evaluation for this.   4. Tinea versicolor  Rash most consistent with tinea versicolor, especially giving location or arms, upper chest, upper back.  Start topical Ketoconazole daily. Follow-up in person if not resolving. Avoiding oral antifungals giving current liver enzyme elevation.   Follow Up Instructions: I discussed the assessment and treatment plan with the patient. The patient was provided an opportunity to ask questions and all were answered. The patient agreed with the plan and demonstrated an understanding of the instructions.  A copy of instructions were sent to the patient via MyChart unless otherwise noted below.   The patient was advised to call back or seek an in-person evaluation if the symptoms worsen or if the condition fails to improve as anticipated.    Hyla Maillard, PA-C

## 2023-08-02 ENCOUNTER — Emergency Department (HOSPITAL_COMMUNITY)
Admission: EM | Admit: 2023-08-02 | Discharge: 2023-08-02 | Disposition: A | Discharge status: Home / Self Care | Payer: MEDICAID | Attending: Emergency Medicine | Admitting: Emergency Medicine

## 2023-08-02 DIAGNOSIS — F1090 Alcohol use, unspecified, uncomplicated: Secondary | ICD-10-CM

## 2023-08-02 DIAGNOSIS — Z720 Tobacco use: Secondary | ICD-10-CM | POA: Insufficient documentation

## 2023-08-02 DIAGNOSIS — F1010 Alcohol abuse, uncomplicated: Secondary | ICD-10-CM | POA: Diagnosis present

## 2023-08-02 DIAGNOSIS — Y906 Blood alcohol level of 120-199 mg/100 ml: Secondary | ICD-10-CM | POA: Insufficient documentation

## 2023-08-02 DIAGNOSIS — D72819 Decreased white blood cell count, unspecified: Secondary | ICD-10-CM | POA: Diagnosis not present

## 2023-08-02 DIAGNOSIS — R45851 Suicidal ideations: Secondary | ICD-10-CM | POA: Insufficient documentation

## 2023-08-02 DIAGNOSIS — F331 Major depressive disorder, recurrent, moderate: Secondary | ICD-10-CM | POA: Diagnosis not present

## 2023-08-02 DIAGNOSIS — F22 Delusional disorders: Secondary | ICD-10-CM | POA: Insufficient documentation

## 2023-08-02 DIAGNOSIS — R7401 Elevation of levels of liver transaminase levels: Secondary | ICD-10-CM | POA: Insufficient documentation

## 2023-08-02 DIAGNOSIS — F319 Bipolar disorder, unspecified: Secondary | ICD-10-CM | POA: Diagnosis present

## 2023-08-02 DIAGNOSIS — T1491XA Suicide attempt, initial encounter: Secondary | ICD-10-CM

## 2023-08-02 LAB — COMPREHENSIVE METABOLIC PANEL WITH GFR
ALT: 50 U/L — ABNORMAL HIGH (ref 0–44)
AST: 55 U/L — ABNORMAL HIGH (ref 15–41)
Albumin: 4.1 g/dL (ref 3.5–5.0)
Alkaline Phosphatase: 65 U/L (ref 38–126)
Anion gap: 14 (ref 5–15)
BUN: 8 mg/dL (ref 6–20)
CO2: 23 mmol/L (ref 22–32)
Calcium: 9.2 mg/dL (ref 8.9–10.3)
Chloride: 107 mmol/L (ref 98–111)
Creatinine, Ser: 0.76 mg/dL (ref 0.61–1.24)
GFR, Estimated: >60 mL/min (ref >60)
Glucose, Bld: 95 mg/dL (ref 70–99)
Potassium: 3.8 mmol/L (ref 3.5–5.1)
Sodium: 144 mmol/L (ref 135–145)
Total Bilirubin: 0.7 mg/dL (ref 0.0–1.2)
Total Protein: 8.5 g/dL — ABNORMAL HIGH (ref 6.5–8.1)

## 2023-08-02 LAB — RAPID URINE DRUG SCREEN, HOSP PERFORMED
Amphetamines: NOT DETECTED
Barbiturates: NOT DETECTED
Benzodiazepines: POSITIVE — AB
Cocaine: NOT DETECTED
Opiates: NOT DETECTED
Tetrahydrocannabinol: NOT DETECTED

## 2023-08-02 LAB — URINALYSIS, ROUTINE W REFLEX MICROSCOPIC
Bacteria, UA: NONE SEEN
Bilirubin Urine: NEGATIVE
Glucose, UA: NEGATIVE mg/dL
Hgb urine dipstick: NEGATIVE
Ketones, ur: 5 mg/dL — AB
Leukocytes,Ua: NEGATIVE
Nitrite: NEGATIVE
Protein, ur: 100 mg/dL — AB
Specific Gravity, Urine: 1.021 (ref 1.005–1.030)
pH: 5 (ref 5.0–8.0)

## 2023-08-02 LAB — CBC WITH DIFFERENTIAL/PLATELET
Abs Immature Granulocytes: 0 K/uL (ref 0.00–0.07)
Basophils Absolute: 0 K/uL (ref 0.0–0.1)
Basophils Relative: 1 %
Eosinophils Absolute: 0.1 K/uL (ref 0.0–0.5)
Eosinophils Relative: 3 %
HCT: 50.2 % (ref 39.0–52.0)
Hemoglobin: 16.1 g/dL (ref 13.0–17.0)
Immature Granulocytes: 0 %
Lymphocytes Relative: 63 %
Lymphs Abs: 2.2 K/uL (ref 0.7–4.0)
MCH: 30.8 pg (ref 26.0–34.0)
MCHC: 32.1 g/dL (ref 30.0–36.0)
MCV: 96.2 fL (ref 80.0–100.0)
Monocytes Absolute: 0.4 K/uL (ref 0.1–1.0)
Monocytes Relative: 11 %
Neutro Abs: 0.8 K/uL — ABNORMAL LOW (ref 1.7–7.7)
Neutrophils Relative %: 22 %
Platelets: 204 K/uL (ref 150–400)
RBC: 5.22 MIL/uL (ref 4.22–5.81)
RDW: 15.7 % — ABNORMAL HIGH (ref 11.5–15.5)
WBC: 3.5 K/uL — ABNORMAL LOW (ref 4.0–10.5)
nRBC: 0 % (ref 0.0–0.2)

## 2023-08-02 LAB — MAGNESIUM: Magnesium: 1.9 mg/dL (ref 1.7–2.4)

## 2023-08-02 LAB — ETHANOL: Alcohol, Ethyl (B): 129 mg/dL — ABNORMAL HIGH (ref <15)

## 2023-08-02 LAB — SALICYLATE LEVEL: Salicylate Lvl: <7 mg/dL — ABNORMAL LOW (ref 7.0–30.0)

## 2023-08-02 LAB — CBG MONITORING, ED: Glucose-Capillary: 126 mg/dL — ABNORMAL HIGH (ref 70–99)

## 2023-08-02 LAB — ACETAMINOPHEN LEVEL: Acetaminophen (Tylenol), Serum: <10 ug/mL — ABNORMAL LOW (ref 10–30)

## 2023-08-02 MED ORDER — ONDANSETRON HCL 4 MG PO TABS
4.0000 mg | ORAL_TABLET | Freq: Three times a day (TID) | ORAL | Status: DC | PRN
Start: 2023-08-02 — End: 2023-08-03

## 2023-08-02 MED ORDER — QUETIAPINE FUMARATE 25 MG PO TABS
25.0000 mg | ORAL_TABLET | Freq: Every day | ORAL | Status: DC
  Administered 2023-08-02: 25 mg via ORAL
  Filled 2023-08-02: qty 1

## 2023-08-02 MED ORDER — THIAMINE MONONITRATE 100 MG PO TABS
100.0000 mg | ORAL_TABLET | Freq: Every day | ORAL | Status: DC
  Administered 2023-08-02: 100 mg via ORAL
  Filled 2023-08-02: qty 1

## 2023-08-02 MED ORDER — LORAZEPAM 1 MG PO TABS: 1.0000 mg | ORAL_TABLET | Freq: Two times a day (BID) | ORAL | Status: DC

## 2023-08-02 MED ORDER — ONDANSETRON 8 MG PO TBDP
8.0000 mg | ORAL_TABLET | Freq: Once | ORAL | Status: AC
  Administered 2023-08-02: 8 mg via ORAL
  Filled 2023-08-02: qty 1

## 2023-08-02 MED ORDER — LORAZEPAM 1 MG PO TABS
0.0000 mg | ORAL_TABLET | Freq: Four times a day (QID) | ORAL | Status: DC
  Administered 2023-08-02 (×2): 1 mg via ORAL
  Filled 2023-08-02 (×2): qty 1

## 2023-08-02 MED ORDER — NICOTINE 21 MG/24HR TD PT24
21.0000 mg | MEDICATED_PATCH | Freq: Every day | TRANSDERMAL | Status: DC
  Administered 2023-08-02: 21 mg via TRANSDERMAL
  Filled 2023-08-02: qty 1

## 2023-08-02 MED ORDER — THIAMINE HCL 100 MG/ML IJ SOLN: 100.0000 mg | Freq: Every day | INTRAMUSCULAR | Status: DC

## 2023-08-02 MED ORDER — LORAZEPAM 1 MG PO TABS: 0.0000 mg | ORAL_TABLET | Freq: Two times a day (BID) | ORAL | Status: DC

## 2023-08-02 NOTE — ED Triage Notes (Signed)
 Per EMS from home. C/o SI. Tried to overdose on a pint of alcohol yesterday.   BP 150/94 HR 86 98 on RA CBG 416

## 2023-08-02 NOTE — Progress Notes (Signed)
 Pt has been accepted to [facility] on 08/02/2023 . Bed assignment:170   Pt meets inpatient criteria per Cathaleen Adam, NP   Attending Physician will be Dr. Larina   Report can be called to: [phone]   Care Team Notified: [facility] Luke Sprang, RN, Chauncey Hammonds, Paramedic, Rudell Blacker, RN

## 2023-08-02 NOTE — ED Provider Notes (Signed)
 Transfer to [facility].  HDS   Safe for transfer   Trine Raynell Moder, MD 08/02/23 2339

## 2023-08-02 NOTE — ED Provider Notes (Signed)
  EMERGENCY DEPARTMENT AT [facility] Provider Note   CSN: [id number] Arrival date & time: 08/02/23  1037     Patient presents with: Suicide Attempt   Thomas Vaughn is a 33 y.o. male.  He has a history of depression anxiety and alcohol use.  He said he has been increasingly depressed and suicidal.  Drinking every day.  He said 2 days ago he overdosed on a bottle of Tylenol .  He said he drinks so much that he forgets what he does and he finds out about it later.  Denies any drug use.  Does not have a primary care doctor.   The history is provided by the patient.  Mental Health Problem Presenting symptoms: depression, suicidal thoughts and suicide attempt   Onset quality:  Unable to specify Treatment compliance:  Untreated Worsened by:  Alcohol Associated symptoms: no abdominal pain and no chest pain   Risk factors: hx of mental illness        Prior to Admission medications   Medication Sig Start Date End Date Taking? Authorizing Provider  hydrOXYzine  (ATARAX ) 25 MG tablet Take 1 tablet (25 mg total) by mouth 3 (three) times daily as needed for anxiety. 04/30/23   Rainelle Pfeiffer, MD  ketoconazole  (NIZORAL ) 2 % cream Apply 1 Application topically daily. 05/10/23   Gladis Elsie BROCKS, PA-C  nicotine  (NICODERM CQ  - DOSED IN MG/24 HOURS) 14 mg/24hr patch Place 1 patch (14 mg total) onto the skin daily. 05/01/23   Rainelle Pfeiffer, MD  QUEtiapine  (SEROQUEL ) 25 MG tablet Take 1 tablet (25 mg total) by mouth at bedtime. 04/30/23   Rainelle Pfeiffer, MD  thiamine  (VITAMIN B-1) 100 MG tablet Take 1 tablet (100 mg total) by mouth daily. 05/01/23   Rainelle Pfeiffer, MD    Allergies: Okra    Review of Systems  Constitutional:  Negative for fever.  Respiratory:  Negative for shortness of breath.   Cardiovascular:  Negative for chest pain.  Gastrointestinal:  Negative for abdominal pain.  Psychiatric/Behavioral:  Positive for suicidal ideas.     Updated Vital Signs BP (!)  148/108 (BP Location: Left Arm)   Pulse 85   Temp 98.6 F (37 C) (Oral)   Resp 18   SpO2 97%   Physical Exam Vitals and nursing note reviewed.  Constitutional:      General: He is not in acute distress.    Appearance: Normal appearance. He is well-developed.  HENT:     Head: Normocephalic and atraumatic.  Eyes:     Conjunctiva/sclera: Conjunctivae normal.  Cardiovascular:     Rate and Rhythm: Normal rate and regular rhythm.     Heart sounds: No murmur heard. Pulmonary:     Effort: Pulmonary effort is normal. No respiratory distress.     Breath sounds: Normal breath sounds.  Abdominal:     Palpations: Abdomen is soft.     Tenderness: There is no abdominal tenderness. There is no guarding or rebound.  Musculoskeletal:        General: No deformity.     Cervical back: Neck supple.  Skin:    General: Skin is warm and dry.     Capillary Refill: Capillary refill takes less than 2 seconds.  Neurological:     General: No focal deficit present.     Mental Status: He is alert and oriented to person, place, and time.     Motor: No weakness.     (all labs ordered are listed, but only abnormal results  are displayed) Labs Reviewed  ETHANOL - Abnormal; Notable for the following components:      Result Value   Alcohol, Ethyl (B) 129 (*)    All other components within normal limits  CBC WITH DIFFERENTIAL/PLATELET - Abnormal; Notable for the following components:   WBC 3.5 (*)    RDW 15.7 (*)    Neutro Abs 0.8 (*)    All other components within normal limits  URINALYSIS, ROUTINE W REFLEX MICROSCOPIC - Abnormal; Notable for the following components:   Ketones, ur 5 (*)    Protein, ur 100 (*)    All other components within normal limits  RAPID URINE DRUG SCREEN, HOSP PERFORMED - Abnormal; Notable for the following components:   Benzodiazepines POSITIVE (*)    All other components within normal limits  ACETAMINOPHEN  LEVEL - Abnormal; Notable for the following components:    Acetaminophen  (Tylenol ), Serum <10 (*)    All other components within normal limits  SALICYLATE LEVEL - Abnormal; Notable for the following components:   Salicylate Lvl <7.0 (*)    All other components within normal limits  COMPREHENSIVE METABOLIC PANEL WITH GFR - Abnormal; Notable for the following components:   Total Protein 8.5 (*)    AST 55 (*)    ALT 50 (*)    All other components within normal limits  CBG MONITORING, ED - Abnormal; Notable for the following components:   Glucose-Capillary 126 (*)    All other components within normal limits  MAGNESIUM     EKG: None  Radiology: No results found.   Procedures   Medications Ordered in the ED  LORazepam  (ATIVAN ) tablet 0-4 mg (1 mg Oral Given 08/02/23 1605)  LORazepam  (ATIVAN ) tablet 1 mg (has no administration in time range)    Or  LORazepam  (ATIVAN ) tablet 0-4 mg (has no administration in time range)  thiamine  (VITAMIN B1) tablet 100 mg (100 mg Oral Given 08/02/23 1606)    Or  thiamine  (VITAMIN B1) injection 100 mg ( Intravenous See Alternative 08/02/23 1606)  ondansetron  (ZOFRAN ) tablet 4 mg (has no administration in time range)  nicotine  (NICODERM CQ  - dosed in mg/24 hours) patch 21 mg (21 mg Transdermal Patch Applied 08/02/23 1606)  ondansetron  (ZOFRAN -ODT) disintegrating tablet 8 mg (8 mg Oral Given 08/02/23 1355)    Clinical Course as of 08/02/23 1758  Tue Aug 02, 2023  1632 Patient's LFTs are chronically elevated and these are the best that they have been in a while.  Although he said he took a bottle of Tylenol  a couple of days I do not think this represents acute hepatic injury due to acetaminophen .  Will medically clear [MB]    Clinical Course User Index [MB] Towana Ozell BROCKS, MD                                 Medical Decision Making Amount and/or Complexity of Data Reviewed Labs: ordered.  Risk OTC drugs. Prescription drug management.   This patient complains of alcohol use depression suicidal  thoughts; this involves an extensive number of treatment Options and is a complaint that carries with it a high risk of complications and morbidity. The differential includes suicide attempt, substance abuse, depression, liver injury, withdrawal  I ordered, reviewed and interpreted labs, which included CBC with low white count, chemistries unremarkable, LFTs mildly elevated, urinalysis without signs of infection, talk screen positive for benzos, alcohol level mildly elevated, aspirin  and Tylenol  negative I  ordered medication Ativan  and vitamins, nicotine  patch and reviewed PMP when indicated. Previous records obtained and reviewed in epic, reviewed prior ED visits for alcohol symptoms I consulted behavioral health and discussed lab and imaging findings and discussed disposition.  Cardiac monitoring reviewed, normal sinus rhythm Social determinants considered, tobacco use Critical Interventions: None  After the interventions stated above, I reevaluated the patient and found patient to be slightly agitated although redirectable Admission and further testing considered, he would benefit from behavioral health evaluation.  Put on CIWA.  Do not feel this represents acute Tylenol  ingestion at this time.      Final diagnoses:  Alcohol abuse  Suicidal behavior with attempted self-injury [facility])    ED Discharge Orders     None          Towana Ozell BROCKS, MD 08/02/23 1800

## 2023-08-02 NOTE — Consult Note (Signed)
 [facility] Psychiatric Consult Initial  Patient Name: .Thomas Vaughn  MRN: [id number]  DOB: 02/16/1990  Consult Order details:  Orders (From admission, onward)     Start     Ordered   08/02/23 1559  CONSULT TO CALL ACT TEAM       Ordering Provider: Towana Ozell BROCKS, MD  Provider:  (Not yet assigned)  Question:  Reason for Consult?  Answer:  Psych consult   08/02/23 1559             Mode of Visit: In person    Psychiatry Consult Evaluation  Service Date: August 02, 2023 LOS:  LOS: 0 days  Chief Complaint suicidal ideations with an attempt to overdose on a pint of alcohol yesterday  Primary Psychiatric Diagnoses  Alcohol use Moderate episode of recurrent major depressive disorder Paranoia ideations  Assessment  Thomas Vaughn is a 33 y.o. male admitted: Presented to the EDfor 08/02/2023 10:39 AM for suicidal ideations with an attempt to overdose on alcohol. He carries the psychiatric diagnoses of depression, and paranoia and has a past medical history of GERD.   His current presentation of spending a lot of time drinking, lack of appetite and sleep, tolerance to alcohol, BAL is 129 is most consistent with alcohol abuse and decompensating mental health. He meets criteria to be admitted to facility based crisis unit at behavioral health urgent care.  Current outpatient psychotropic medications include Seroquel , Atarax  and historically he has had a negative response to these medications. He was non compliant with medications prior to admission as evidenced by patient report. On initial examination, patient pleasant and cooperative. Please see plan below for detailed recommendations.   Diagnoses:  Active Hospital problems: Principal Problem:   Alcohol abuse Active Problems:   Bipolar disorder, unspecified (HCC)    Plan   ## Psychiatric Medication Recommendations:  Start Seroquel  25 mg at night p.o. for insomnia and mood   ## Medical Decision Making Capacity: Not  specifically addressed in this encounter  ## Further Work-up:  -- No further workup needed at this time EKG or UDS -- most recent EKG on 08/02/2023 had QtC of 461 -- Pertinent labwork reviewed earlier this admission includes: CBC, CMP, EKG, UDS   ## Disposition:-- We recommend transfer to [facility] He meets criteria to be admitted to facility based crisis unit at behavioral health urgent care for alcohol detox.    ## Behavioral / Environmental: -Delirium Precautions: Delirium Interventions for Nursing and Staff: - RN to open blinds every AM. - To Bedside: Glasses, hearing aide, and pt's own shoes. Make available to patients. when possible and encourage use. - Encourage po fluids when appropriate, keep fluids within reach. - OOB to chair with meals. - Passive ROM exercises to all extremities with AM & PM care. - RN to assess orientation to person, time and place QAM and PRN. - Recommend extended visitation hours with familiar family/friends as feasible. - Staff to minimize disturbances at night. Turn off television when pt asleep or when not in use., To minimize splitting of staff, assign one staff person to communicate all information from the team when feasible., or Utilize compassion and acknowledge the patient's experiences while setting clear and realistic expectations for care.    ## Safety and Observation Level:  - Based on my clinical evaluation, I estimate the patient to be at low risk of self harm in the current setting. - At this time, we recommend  routine. This decision is  based on my review of the chart including patient's history and current presentation, interview of the patient, mental status examination, and consideration of suicide risk including evaluating suicidal ideation, plan, intent, suicidal or self-harm behaviors, risk factors, and protective factors. This judgment is based on our ability to directly address suicide risk, implement suicide  prevention strategies, and develop a safety plan while the patient is in the clinical setting. Please contact our team if there is a concern that risk level has changed.  CSSR Risk Category:C-SSRS RISK CATEGORY: High Risk  Suicide Risk Assessment: Patient has following modifiable risk factors for suicide: active suicidal ideation, untreated depression, recklessness, medication noncompliance, and current symptoms: anxiety/panic, insomnia, impulsivity, anhedonia, hopelessness, which we are addressing by recommending patient be transferred to facility based crisis Center for alcohol detox. Patient has following non-modifiable or demographic risk factors for suicide: male gender and psychiatric hospitalization Patient has the following protective factors against suicide: Supportive family and Minor children in the home  Thank you for this consult request. Recommendations have been communicated to the primary team.  We will continue to follow patient at this time.   CATHALEEN ADAM, PMHNP       History of Present Illness  Relevant Aspects of Hospital ED Course:  Admitted on 08/02/2023 for alcohol detox  Patient Report:  Thomas Vaughn, 33 y.o., male patient seen face to face by this provider, consulted with Dr. Larina; and chart reviewed on 08/02/23.  On evaluation Thomas Vaughn reports That he is having intermittent suicidal thoughts, states that he suffers from paranoia and PTSD and he has been struggling with his mental health for a while.  He states that he has been drinking excessively, from the time he wakes up until the time he goes to bed.  He states that he has been drinking mainly beers, 4 Locos, and when he has enough money he states he purchases liquor.  He states his last drink was today.  Patient states that he also has been paranoid, to the point that he has not been sleeping, states he always feels that someone is after him, also denies poor appetite due to excessive drinking  alcohol.  On admission patient BAL was 129, UDS is pending. Pt stated that he has been able to stop drinking alcohol completely for a year or two at a time in the past but has always started again. Pt stated that he has the insight that he is self-medicating.  Patient states that he was admitted to the facility based crisis Center in April, states that the medications that gave him to help decrease his alcohol appetite really worked for a little while, states that he wants to go back to get help.  States that he was prescribed Atarax  and Seroquel  at discharge back in April 2025, but has not been compliant with his medication.  Patient states that he also has some stressors going on in his life, states he recently lost his job at [employer], felt that his manager was attacking him due to his hair which consist of dreads, and his tattoos.  States he is having a hard time finding employment, states that he does have a felony on his record.  States he also has 5 children that he does not see, was divorced from his wife about a year ago and did not want to be divorced, and they have 1 child together.  States he also has 1 child by his recent ex-girlfriend.  He states has been  living with a woman, who has 5 children and 2 grandchildren in the home, since January, states when he received his taxes in February everything was good, but states now his girlfriend is starting to stressed him out about paying bills and finances, which she says has been stressing him out.  During evaluation Thomas Vaughn is seen standing in his room, pacing around his bed, appears to be in moderate distress. He is alert, oriented x 3, calm, cooperative and attentive.  His mood is anxious with congruent affect.  He has normal speech, and behavior.  Objectively there is no evidence of psychosis/mania or delusional thinking.  Patient is able to converse coherently, goal directed thoughts, no distractibility, or pre-occupation.  He  currently denies suicidal/self-harm/homicidal ideation, psychosis, and paranoia. He denies current withdrawal symptoms. He denies history of withdrawal seizures and Dts. He reports having paranoia of people following him and states that he avoids certain places due to this fear. He states he thinks cars are following him when he is in the car.    Psych ROS:  Depression: Endorses Anxiety: Endorses Mania (lifetime and current): Denies Psychosis: (lifetime and current): Intermittent paranoia  Collateral information:  Contacted None  Review of Systems  Psychiatric/Behavioral:  Positive for depression, substance abuse and suicidal ideas.      Psychiatric and Social History  Psychiatric History:  Information collected from patient and chart review  Prev Dx/Sx: Anxiety and depression Current Psych Provider: Denies Home Meds (current): Denies Previous Med Trials: Yes Therapy: Denies  Prior Psych Hospitalization: Yes Prior Self Harm: Denies Prior Violence: Denies  Family Psych History: Denies Family Hx suicide: Denies  Social History:  Developmental Hx: Deferred Educational Hx: Graduated high school Occupational Hx: Unemployed, recently fired from Museum/gallery curator Hx: Yes Living Situation: Lives with his girlfriend and her children Spiritual Hx: Yes Access to weapons/lethal means: Denies   Substance History Alcohol: Yes Type of alcohol beer and liquor Last Drink today Number of drinks per day varies History of alcohol withdrawal seizures denies History of DT's denies Tobacco: Yes Illicit drugs: Denies Prescription drug abuse: Denies Rehab hx: Denies  Exam Findings  Physical Exam:  Vital Signs:  Temp:  [98.6 F (37 C)] 98.6 F (37 C) (07/22 1102) Pulse Rate:  [84-88] 88 (07/22 1612) Resp:  [18] 18 (07/22 1552) BP: (132-148)/(98-108) 132/98 (07/22 1612) SpO2:  [97 %-99 %] 99 % (07/22 1552) Blood pressure (!) 132/98, pulse 88, temperature 98.6 F (37 C),  temperature source Oral, resp. rate 18, SpO2 99%. There is no height or weight on file to calculate BMI.  Physical Exam Vitals and nursing note reviewed. Exam conducted with a chaperone present.  Neurological:     Mental Status: He is alert.  Psychiatric:        Attention and Perception: Attention normal.        Mood and Affect: Affect normal. Mood is anxious.        Speech: Speech normal.        Behavior: Behavior is cooperative.        Thought Content: Thought content includes suicidal ideation.        Cognition and Memory: Memory normal.        Judgment: Judgment is inappropriate.     Mental Status Exam: General Appearance: Disheveled  Orientation:  Full (Time, Place, and Person)  Memory:  Immediate;   Fair Remote;   Fair  Concentration:  Concentration: Fair and Attention Span: Poor  Recall:  Fair  Attention  Poor  Eye Contact:  Fair  Speech:  Clear and Coherent  Language:  Fair  Volume:  Normal  Mood: cooperative  Affect:  Congruent  Thought Process:  Linear  Thought Content:  Paranoid Ideation  Suicidal Thoughts:  Yes drink himself to death  Homicidal Thoughts:  No  Judgement:  Poor  Insight:  Shallow  Psychomotor Activity:  Normal  Akathisia:  NA  Fund of Knowledge:  Fair    Assets:  Manufacturing systems engineer Desire for Improvement Financial Resources/Insurance Housing Social Support  Cognition:  Impaired,  Mild  ADL's:  Impaired  AIMS (if indicated):        Other History   These have been pulled in through the EMR, reviewed, and updated if appropriate.  Family History:  The patient's family history includes Heart failure in his mother.  Medical History: Past Medical History:  Diagnosis Date   ADHD (attention deficit hyperactivity disorder)    Anxiety    Asthma    Depression     Surgical History: No past surgical history on file.   Medications:   Current Facility-Administered Medications:    LORazepam  (ATIVAN ) tablet 0-4 mg, 0-4 mg, Oral, Q6H,  Towana Ozell BROCKS, MD, 1 mg at 08/02/23 1605   [START ON 08/05/2023] LORazepam  (ATIVAN ) tablet 1 mg, 1 mg, Oral, Q12H **OR** [START ON 08/05/2023] LORazepam  (ATIVAN ) tablet 0-4 mg, 0-4 mg, Oral, Q12H, Butler, Michael C, MD   nicotine  (NICODERM CQ  - dosed in mg/24 hours) patch 21 mg, 21 mg, Transdermal, Daily, Butler, Michael C, MD, 21 mg at 08/02/23 1606   ondansetron  (ZOFRAN ) tablet 4 mg, 4 mg, Oral, Q8H PRN, Towana Ozell BROCKS, MD   thiamine  (VITAMIN B1) tablet 100 mg, 100 mg, Oral, Daily, 100 mg at 08/02/23 1606 **OR** thiamine  (VITAMIN B1) injection 100 mg, 100 mg, Intravenous, Daily, Towana Ozell BROCKS, MD  Current Outpatient Medications:    nicotine  (NICODERM CQ  - DOSED IN MG/24 HOURS) 14 mg/24hr patch, Place 1 patch (14 mg total) onto the skin daily., Disp: 28 patch, Rfl: 0   hydrOXYzine  (ATARAX ) 25 MG tablet, Take 1 tablet (25 mg total) by mouth 3 (three) times daily as needed for anxiety. (Patient not taking: Reported on 08/02/2023), Disp: 30 tablet, Rfl: 0   ketoconazole  (NIZORAL ) 2 % cream, Apply 1 Application topically daily. (Patient not taking: Reported on 08/02/2023), Disp: 15 g, Rfl: 0   QUEtiapine  (SEROQUEL ) 25 MG tablet, Take 1 tablet (25 mg total) by mouth at bedtime. (Patient not taking: Reported on 08/02/2023), Disp: 14 tablet, Rfl: 0   thiamine  (VITAMIN B-1) 100 MG tablet, Take 1 tablet (100 mg total) by mouth daily. (Patient not taking: Reported on 08/02/2023), Disp: 30 tablet, Rfl: 0  Allergies: Allergies  Allergen Reactions   Other Other (See Comments) and Hypertension    Unnamed/unknown (name not recalled) med issued by [facility] in 2015 or 2016 caused a massive increase in the B/P and the patient said he had to go to the ED as a result of this.   Okra Other (See Comments)    Radioactive feeling in my head    Burke Terry MOTLEY-MANGRUM, PMHNP

## 2023-08-03 ENCOUNTER — Ambulatory Visit (HOSPITAL_COMMUNITY)
Admission: EM | Admit: 2023-08-03 | Discharge: 2023-08-03 | Disposition: A | Discharge status: Other Acute Inpatient Hospital | Payer: MEDICAID | Source: Intra-hospital

## 2023-08-03 ENCOUNTER — Other Ambulatory Visit (HOSPITAL_COMMUNITY)
Admission: EM | Admit: 2023-08-03 | Discharge: 2023-08-08 | Disposition: A | Discharge status: Home / Self Care | Payer: MEDICAID | Source: Intra-hospital | Attending: Psychiatry | Admitting: Psychiatry

## 2023-08-03 DIAGNOSIS — F22 Delusional disorders: Secondary | ICD-10-CM

## 2023-08-03 DIAGNOSIS — F10129 Alcohol abuse with intoxication, unspecified: Secondary | ICD-10-CM | POA: Insufficient documentation

## 2023-08-03 DIAGNOSIS — F1010 Alcohol abuse, uncomplicated: Secondary | ICD-10-CM | POA: Diagnosis present

## 2023-08-03 DIAGNOSIS — F1910 Other psychoactive substance abuse, uncomplicated: Secondary | ICD-10-CM

## 2023-08-03 DIAGNOSIS — Z91148 Patient's other noncompliance with medication regimen for other reason: Secondary | ICD-10-CM

## 2023-08-03 DIAGNOSIS — F1994 Other psychoactive substance use, unspecified with psychoactive substance-induced mood disorder: Secondary | ICD-10-CM | POA: Diagnosis not present

## 2023-08-03 DIAGNOSIS — F1090 Alcohol use, unspecified, uncomplicated: Secondary | ICD-10-CM

## 2023-08-03 MED ORDER — OLANZAPINE 10 MG IM SOLR: 5.0000 mg | Freq: Three times a day (TID) | INTRAMUSCULAR | Status: DC | PRN

## 2023-08-03 MED ORDER — CHLORDIAZEPOXIDE HCL 25 MG PO CAPS
25.0000 mg | ORAL_CAPSULE | Freq: Four times a day (QID) | ORAL | Status: AC
  Administered 2023-08-03 – 2023-08-04 (×4): 25 mg via ORAL
  Filled 2023-08-03 (×4): qty 1

## 2023-08-03 MED ORDER — LOPERAMIDE HCL 2 MG PO CAPS: 2.0000 mg | ORAL_CAPSULE | ORAL | Status: AC | PRN

## 2023-08-03 MED ORDER — CHLORDIAZEPOXIDE HCL 25 MG PO CAPS
25.0000 mg | ORAL_CAPSULE | ORAL | Status: AC
  Administered 2023-08-05 – 2023-08-06 (×2): 25 mg via ORAL
  Filled 2023-08-03 (×2): qty 1

## 2023-08-03 MED ORDER — CHLORDIAZEPOXIDE HCL 25 MG PO CAPS
25.0000 mg | ORAL_CAPSULE | Freq: Three times a day (TID) | ORAL | Status: AC
  Administered 2023-08-04 – 2023-08-05 (×3): 25 mg via ORAL
  Filled 2023-08-03 (×3): qty 1

## 2023-08-03 MED ORDER — ALUM & MAG HYDROXIDE-SIMETH 200-200-20 MG/5ML PO SUSP: 30.0000 mL | ORAL | Status: DC | PRN

## 2023-08-03 MED ORDER — OLANZAPINE 5 MG PO TBDP
5.0000 mg | ORAL_TABLET | Freq: Three times a day (TID) | ORAL | Status: DC | PRN
  Administered 2023-08-07: 5 mg via ORAL
  Filled 2023-08-03: qty 1

## 2023-08-03 MED ORDER — HYDROXYZINE HCL 25 MG PO TABS
25.0000 mg | ORAL_TABLET | Freq: Four times a day (QID) | ORAL | Status: AC | PRN
  Administered 2023-08-03 – 2023-08-05 (×3): 25 mg via ORAL
  Filled 2023-08-03 (×3): qty 1

## 2023-08-03 MED ORDER — CHLORDIAZEPOXIDE HCL 25 MG PO CAPS
25.0000 mg | ORAL_CAPSULE | Freq: Four times a day (QID) | ORAL | Status: AC | PRN
  Administered 2023-08-04: 25 mg via ORAL
  Filled 2023-08-03: qty 1

## 2023-08-03 MED ORDER — OLANZAPINE 10 MG IM SOLR: 10.0000 mg | Freq: Three times a day (TID) | INTRAMUSCULAR | Status: DC | PRN

## 2023-08-03 MED ORDER — THIAMINE HCL 100 MG/ML IJ SOLN: 100.0000 mg | Freq: Once | INTRAMUSCULAR | Status: DC

## 2023-08-03 MED ORDER — ONDANSETRON 4 MG PO TBDP
4.0000 mg | ORAL_TABLET | Freq: Four times a day (QID) | ORAL | Status: AC | PRN
  Administered 2023-08-03: 4 mg via ORAL
  Filled 2023-08-03: qty 1

## 2023-08-03 MED ORDER — ACETAMINOPHEN 325 MG PO TABS
650.0000 mg | ORAL_TABLET | Freq: Four times a day (QID) | ORAL | Status: DC | PRN
  Administered 2023-08-04: 650 mg via ORAL
  Filled 2023-08-03: qty 2

## 2023-08-03 MED ORDER — MAGNESIUM HYDROXIDE 400 MG/5ML PO SUSP: 30.0000 mL | Freq: Every day | ORAL | Status: DC | PRN

## 2023-08-03 MED ORDER — ADULT MULTIVITAMIN W/MINERALS CH
1.0000 | ORAL_TABLET | Freq: Every day | ORAL | Status: DC
  Administered 2023-08-03 – 2023-08-08 (×6): 1 via ORAL
  Filled 2023-08-03 (×6): qty 1

## 2023-08-03 MED ORDER — CHLORDIAZEPOXIDE HCL 25 MG PO CAPS
25.0000 mg | ORAL_CAPSULE | Freq: Every day | ORAL | Status: AC
  Administered 2023-08-06: 25 mg via ORAL
  Filled 2023-08-03: qty 1

## 2023-08-03 MED ORDER — CHLORDIAZEPOXIDE HCL 25 MG PO CAPS: 25.0000 mg | ORAL_CAPSULE | Freq: Four times a day (QID) | ORAL | Status: DC | PRN

## 2023-08-03 NOTE — ED Notes (Signed)
 Patient A&Ox4. Denies intent to harm self/others when asked. Denies A/VH. Patient denies any physical complaints when asked. Pt states, I've lost so much behind my drinking. My wife left me, I've lost a lot of jobs, my children was taken from me and now they all live in other states. I've got to get my life in order before it's too late. Support and encouragement provided. Routine safety checks conducted according to facility protocol. Encouraged patient to notify staff if thoughts of harm toward self or others arise. Patient verbalize understanding and agreement. Will continue to monitor for safety.

## 2023-08-03 NOTE — Group Note (Signed)
 Group Topic: Communication  Group Date: 08/03/2023 Start Time: 0900 End Time: 1000 Facilitators: Herold Lajuana NOVAK, RN  Department: [facility]  Number of Participants: 8  Group Focus: communication Treatment Modality:  Individual Therapy Interventions utilized were patient education Purpose: increase insight  Name: Thomas Vaughn Date of Birth: 25-Dec-1990  MR: [id number]      Patients Problems:  Level of Participation: active Quality of Participation: cooperative Interactions with others: gave feedback Mood/Affect: appropriate Triggers (if applicable): none identified Cognition: coherent/clear and goal directed Progress: Significant Response: Pt verbalized understanding of all medications administered Plan: patient will be encouraged to remain med compliant throughout tx and to notify staff with any questions or concerns

## 2023-08-03 NOTE — Group Note (Signed)
 Group Topic: Feelings about Diagnosis  Group Date: 08/03/2023 Start Time: 2000 End Time: 2130 Facilitators: Joan Plowman B  Department: [facility]  Number of Participants: 6  Group Focus: abuse issues, acceptance, activities of daily living skills, chemical dependency issues, coping skills, daily focus, depression, discharge education, healthy friendships, personal responsibility, relapse prevention, relaxation, self-esteem, social skills, and substance abuse education Treatment Modality:  Leisure Development Interventions utilized were leisure development and patient education Purpose: express feelings, increase insight, and relapse prevention strategies  Name: Thomas Vaughn Date of Birth: 1990-12-05  MR: [id number]    Level of Participation: active Quality of Participation: attentive and cooperative Interactions with others: gave feedback Mood/Affect: appropriate Triggers (if applicable): NA Cognition: coherent/clear Progress: Gaining insight Response: He is ready to get his life together.  Plan: patient will be encouraged to keep going to groups.   Patients Problems:  Patient Active Problem List   Diagnosis Date Noted   Alcohol abuse 04/16/2017   Bipolar disorder, unspecified (HCC) 04/15/2017   ADHD (attention deficit hyperactivity disorder)    HYPOKALEMIA 07/29/2009   ANEMIA OF OTHER CHRONIC DISEASE 07/29/2009   GYNECOMASTIA 07/29/2009   CHEST PAIN UNSPECIFIED 07/29/2009

## 2023-08-03 NOTE — ED Provider Notes (Signed)
 Behavioral Health Progress Note  Date and Time: 08/03/2023 6:15 PM Name: Thomas Vaughn MRN:  [id number]  HPI: Thomas Vaughn is a 33 y.o. male with a history of alcohol use disorder who presented to the [facility] in the very hours of today morning with complaints of worsening substance abuse and depressive symptoms.  Patient assessment: Patient is visibly anxious during encounter, presents with passive suicidal ideations, denies having any active plan or intent to harm himself.  He reports drinking at least a pint of vodka daily, in addition to a couple of [employer] drinks, which she describes as cheap beer consisting of 12% alcohol per bottle.  Patient reports that he has been drinking alcohol for 33 years old.  Reports never having a period of sobriety, with the exception of 3 months here, 5 months there, or a day here and there.  Patient reports that he gets irritable when he does not have alcohol in his system.  Denies any history of DTs or alcohol related withdrawal seizures but states that in 2 of his relationships, the woman had told him that he shakes and his sleep.  States that he is unsure if he is having a seizure during this time.  Many blackouts related to alcohol use reported.  Reports never been to rehab before.  Patient also reports abusing Xanax  or Valium, states that he typically gets up from the street, or an older person who has a prescribed to them.  He denies any other substance abuse.  Patient reports stressors which caused him to drink as being multiple, with the most significant one being the murder of his brother 4 years ago.  Patient reports that since that time, he has had a significant amount of paranoia, and has felt like people are out to hurt him.  Reports sleepless nights where he is just staring out the window repeatedly and afraid that there are people out to harm him.  Patient reports his appetite as poor with anhedonia, reports that he used to enjoy music, but it  is no longer enjoyable because of the way he feels. Pt reports feelings of guilt related to his substance use, reports not seeing his brother being buried 4 yrs ago due to being incarcerated at that time. He reports mental clouding, reports feelings of hopelessness, helplessness, and worthlessness, but reports motivation to get better.  He reports a prior diagnosis of bipolar d/o, states that this was from [place], but this diagnosis is questionable as pt denies any high extremely high energy level in the context of no substance abuse. He is unable to recall his past medications, but states that he had a medication where he would take it, get up every morning, vomit, but still go get himself some alcohol and drink it. He states that the vomiting did not stop him from taking this medication. Per chart review, patient was on Antabuse  250 mg when he presented to this location on 04/27/2023. Records show that he was actually admitted to the [facility] at that time. We will not be restarting this medication, as it was not effective for him.  Patient denies HI, denies AVH currently, endorses paranoia as noted above, presents with passive SI, denies a plan or intent to harm self or anyone else.  Diagnosis:  Final diagnoses:  Alcohol abuse with intoxication (HCC)  History of medication noncompliance    Total Time spent with patient: 1 hour  Past Psychiatric History: MDD questionable bipolar d/o. GAD. Past Medical History: denies  Family History: reports history of cardiac disease in his family Family Psychiatric  History: bipolar d/o Social History: Reports that he is currently unemployed, resides with his girlfriend, girlfriend has seven children that he helps take care of, with the oldest one being 34 years old.  He reports that he has 8 children of his own.  He reports that his parents reside in [place], and are supportive of him.  He reports that he used to go to [place] for his mental health  services.  Sleep: Poor  Appetite:  Poor  Current Medications:  Current Facility-Administered Medications  Medication Dose Route Frequency Provider Last Rate Last Admin   acetaminophen  (TYLENOL ) tablet 650 mg  650 mg Oral Q6H PRN Trudy Carwin, NP       alum & mag hydroxide-simeth (MAALOX/MYLANTA) 200-200-20 MG/5ML suspension 30 mL  30 mL Oral Q4H PRN Trudy Carwin, NP       chlordiazePOXIDE  (LIBRIUM ) capsule 25 mg  25 mg Oral Q6H PRN Tex Drilling, NP       chlordiazePOXIDE  (LIBRIUM ) capsule 25 mg  25 mg Oral QID Laquesha Holcomb, NP   25 mg at 08/03/23 1740   Followed by   NOREEN ON 08/04/2023] chlordiazePOXIDE  (LIBRIUM ) capsule 25 mg  25 mg Oral TID Tex Drilling, NP       Followed by   NOREEN ON 08/05/2023] chlordiazePOXIDE  (LIBRIUM ) capsule 25 mg  25 mg Oral BH-qamhs Marvetta Vohs, NP       Followed by   NOREEN ON 08/06/2023] chlordiazePOXIDE  (LIBRIUM ) capsule 25 mg  25 mg Oral Daily Anamae Rochelle, Drilling, NP       hydrOXYzine  (ATARAX ) tablet 25 mg  25 mg Oral Q6H PRN Trudy Carwin, NP   25 mg at 08/03/23 9065   loperamide  (IMODIUM ) capsule 2-4 mg  2-4 mg Oral PRN Trudy Carwin, NP       magnesium  hydroxide (MILK OF MAGNESIA) suspension 30 mL  30 mL Oral Daily PRN Trudy Carwin, NP       multivitamin with minerals tablet 1 tablet  1 tablet Oral Daily Trudy Carwin, NP   1 tablet at 08/03/23 9068   OLANZapine  (ZYPREXA ) injection 10 mg  10 mg Intramuscular TID PRN Trudy Carwin, NP       OLANZapine  (ZYPREXA ) injection 5 mg  5 mg Intramuscular TID PRN Trudy Carwin, NP       OLANZapine  zydis (ZYPREXA ) disintegrating tablet 5 mg  5 mg Oral TID PRN Trudy Carwin, NP       ondansetron  (ZOFRAN -ODT) disintegrating tablet 4 mg  4 mg Oral Q6H PRN Trudy Carwin, NP   4 mg at 08/03/23 9065   thiamine  (VITAMIN B1) injection 100 mg  100 mg Intramuscular Once Trudy Carwin, NP       Current Outpatient Medications  Medication Sig Dispense Refill   hydrOXYzine  (ATARAX ) 25 MG tablet Take 1 tablet (25 mg  total) by mouth 3 (three) times daily as needed for anxiety. (Patient not taking: Reported on 08/02/2023) 30 tablet 0   ketoconazole  (NIZORAL ) 2 % cream Apply 1 Application topically daily. (Patient not taking: Reported on 08/02/2023) 15 g 0   nicotine  (NICODERM CQ  - DOSED IN MG/24 HOURS) 14 mg/24hr patch Place 1 patch (14 mg total) onto the skin daily. 28 patch 0   QUEtiapine  (SEROQUEL ) 25 MG tablet Take 1 tablet (25 mg total) by mouth at bedtime. (Patient not taking: Reported on 08/02/2023) 14 tablet 0   thiamine  (VITAMIN B-1) 100 MG tablet Take 1 tablet (100 mg total)  by mouth daily. (Patient not taking: Reported on 08/02/2023) 30 tablet 0    Labs  Lab Results:  Admission on 08/02/2023, Discharged on 08/02/2023  Component Date Value Ref Range Status   Alcohol, Ethyl (B) 08/02/2023 129 (H)  <15 mg/dL Final   Comment: (NOTE) For medical purposes only. Performed at [facility], [address] [place], [place] [address]    WBC 08/02/2023 3.5 (L)  4.0 - 10.5 K/uL Final   RBC 08/02/2023 5.22  4.22 - 5.81 MIL/uL Final   Hemoglobin 08/02/2023 16.1  13.0 - 17.0 g/dL Final   HCT 92/77/7974 50.2  39.0 - 52.0 % Final   MCV 08/02/2023 96.2  80.0 - 100.0 fL Final   MCH 08/02/2023 30.8  26.0 - 34.0 pg Final   MCHC 08/02/2023 32.1  30.0 - 36.0 g/dL Final   RDW 92/77/7974 15.7 (H)  11.5 - 15.5 % Final   Platelets 08/02/2023 204  150 - 400 K/uL Final   nRBC 08/02/2023 0.0  0.0 - 0.2 % Final   Neutrophils Relative % 08/02/2023 22  % Final   Neutro Abs 08/02/2023 0.8 (L)  1.7 - 7.7 K/uL Final   Lymphocytes Relative 08/02/2023 63  % Final   Lymphs Abs 08/02/2023 2.2  0.7 - 4.0 K/uL Final   Monocytes Relative 08/02/2023 11  % Final   Monocytes Absolute 08/02/2023 0.4  0.1 - 1.0 K/uL Final   Eosinophils Relative 08/02/2023 3  % Final   Eosinophils Absolute 08/02/2023 0.1  0.0 - 0.5 K/uL Final   Basophils Relative 08/02/2023 1  % Final   Basophils Absolute 08/02/2023 0.0  0.0 - 0.1  K/uL Final   RBC Morphology 08/02/2023 MORPHOLOGY UNREMARKABLE   Final   Smear Review 08/02/2023 MORPHOLOGY UNREMARKABLE   Final   Immature Granulocytes 08/02/2023 0  % Final   Abs Immature Granulocytes 08/02/2023 0.00  0.00 - 0.07 K/uL Final   Reactive, Benign Lymphocytes 08/02/2023 PRESENT   Final   Performed at [facility], [address], [place], [place] [address]   Color, Urine 08/02/2023 YELLOW  YELLOW Final   APPearance 08/02/2023 CLEAR  CLEAR Final   Specific Gravity, Urine 08/02/2023 1.021  1.005 - 1.030 Final   pH 08/02/2023 5.0  5.0 - 8.0 Final   Glucose, UA 08/02/2023 NEGATIVE  NEGATIVE mg/dL Final   Hgb urine dipstick 08/02/2023 NEGATIVE  NEGATIVE Final   Bilirubin Urine 08/02/2023 NEGATIVE  NEGATIVE Final   Ketones, ur 08/02/2023 5 (A)  NEGATIVE mg/dL Final   Protein, ur 92/77/7974 100 (A)  NEGATIVE mg/dL Final   Nitrite 92/77/7974 NEGATIVE  NEGATIVE Final   Leukocytes,Ua 08/02/2023 NEGATIVE  NEGATIVE Final   RBC / HPF 08/02/2023 0-5  0 - 5 RBC/hpf Final   WBC, UA 08/02/2023 0-5  0 - 5 WBC/hpf Final   Bacteria, UA 08/02/2023 NONE SEEN  NONE SEEN Final   Squamous Epithelial / HPF 08/02/2023 0-5  0 - 5 /HPF Final   Mucus 08/02/2023 PRESENT   Final   Hyaline Casts, UA 08/02/2023 PRESENT   Final   Performed at [facility], [address] [place], [place] [address]   Opiates 08/02/2023 NONE DETECTED  NONE DETECTED Final   Cocaine 08/02/2023 NONE DETECTED  NONE DETECTED Final   Benzodiazepines 08/02/2023 POSITIVE (A)  NONE DETECTED Final   Amphetamines 08/02/2023 NONE DETECTED  NONE DETECTED Final   Tetrahydrocannabinol 08/02/2023 NONE DETECTED  NONE DETECTED Final   Barbiturates 08/02/2023 NONE DETECTED  NONE DETECTED Final   Comment: (NOTE) DRUG SCREEN FOR MEDICAL PURPOSES ONLY.  IF CONFIRMATION IS NEEDED FOR ANY PURPOSE, NOTIFY LAB WITHIN 5 DAYS.  LOWEST DETECTABLE LIMITS FOR URINE DRUG SCREEN Drug Class                      Cutoff (ng/mL) Amphetamine and metabolites    1000 Barbiturate and metabolites    200 Benzodiazepine                 200 Opiates and metabolites        300 Cocaine and metabolites        300 THC                            50 Performed at [facility], [address], [place], [place] [address]    Acetaminophen  (Tylenol ), Serum 08/02/2023 <10 (L)  10 - 30 ug/mL Final   Comment: (NOTE) Therapeutic concentrations vary significantly. A range of 10-30 ug/mL  may be an effective concentration for many patients. However, some  are best treated at concentrations outside of this range. Acetaminophen  concentrations >150 ug/mL at 4 hours after ingestion  and >50 ug/mL at 12 hours after ingestion are often associated with  toxic reactions.  Performed at [facility], [address], [place], [place] [address]    Salicylate Lvl 08/02/2023 <7.0 (L)  7.0 - 30.0 mg/dL Final   Performed at [facility], [address] [place], [place] [address]   Sodium 08/02/2023 144  135 - 145 mmol/L Final   Potassium 08/02/2023 3.8  3.5 - 5.1 mmol/L Final   Chloride 08/02/2023 107  98 - 111 mmol/L Final   CO2 08/02/2023 23  22 - 32 mmol/L Final   Glucose, Bld 08/02/2023 95  70 - 99 mg/dL Final   Glucose reference range applies only to samples taken after fasting for at least 8 hours.   BUN 08/02/2023 8  6 - 20 mg/dL Final   Creatinine, Ser 08/02/2023 0.76  0.61 - 1.24 mg/dL Final   Calcium 92/77/7974 9.2  8.9 - 10.3 mg/dL Final   Total Protein 92/77/7974 8.5 (H)  6.5 - 8.1 g/dL Final   Albumin 92/77/7974 4.1  3.5 - 5.0 g/dL Final   AST 92/77/7974 55 (H)  15 - 41 U/L Final   ALT 08/02/2023 50 (H)  0 - 44 U/L Final   Alkaline Phosphatase 08/02/2023 65  38 - 126 U/L Final   Total Bilirubin 08/02/2023 0.7  0.0 - 1.2 mg/dL Final   GFR, Estimated 08/02/2023 >60  >60 mL/min Final   Comment: (NOTE) Calculated using the CKD-EPI Creatinine Equation (2021)     Anion gap 08/02/2023 14  5 - 15 Final   Performed at [facility], [address], [place], [place] [address]   Magnesium  08/02/2023 1.9  1.7 - 2.4 mg/dL Final   Performed at [facility], [address], [place], [place] [address]   Glucose-Capillary 08/02/2023 126 (H)  70 - 99 mg/dL Final   Glucose reference range applies only to samples taken after fasting for at least 8 hours.  Admission on 04/27/2023, Discharged on 04/30/2023  Component Date Value Ref Range Status   Free T4 04/27/2023 0.70  0.61 - 1.12 ng/dL Final   Comment: (NOTE) Biotin ingestion may interfere with free T4 tests. If the results are inconsistent with the  TSH level, previous test results, or the clinical presentation, then consider biotin interference. If needed, order repeat testing after stopping biotin. Performed at [facility], [address], [place], [place] [address]    T3, Free 04/29/2023 2.5  2.0 - 4.4 pg/mL Final   Comment: (NOTE) Performed At: [facility] [address] [place], [place] [id number] Jennette Shorter MD Ey:[id number]    Total Protein 04/29/2023 7.3  6.5 - 8.1 g/dL Final   Albumin 95/81/7974 3.3 (L)  3.5 - 5.0 g/dL Final   AST 95/81/7974 94 (H)  15 - 41 U/L Final   ALT 04/29/2023 95 (H)  0 - 44 U/L Final   Alkaline Phosphatase 04/29/2023 55  38 - 126 U/L Final   Total Bilirubin 04/29/2023 0.8  0.0 - 1.2 mg/dL Final   Bilirubin, Direct 04/29/2023 0.2  0.0 - 0.2 mg/dL Final   Indirect Bilirubin 04/29/2023 0.6  0.3 - 0.9 mg/dL Final   Performed at [facility], [address], [place], [place] [address]   Neisseria Gonorrhea 04/29/2023 Negative   Final   Chlamydia 04/29/2023 Negative   Final   Comment 04/29/2023 Normal Reference Ranger Chlamydia - Negative   Final   Comment 04/29/2023 Normal Reference Range Neisseria Gonorrhea - Negative   Final   Color, Urine 04/30/2023 YELLOW  YELLOW Final   APPearance 04/30/2023 CLEAR  CLEAR  Final   Specific Gravity, Urine 04/30/2023 1.023  1.005 - 1.030 Final   pH 04/30/2023 7.0  5.0 - 8.0 Final   Glucose, UA 04/30/2023 NEGATIVE  NEGATIVE mg/dL Final   Hgb urine dipstick 04/30/2023 NEGATIVE  NEGATIVE Final   Bilirubin Urine 04/30/2023 NEGATIVE  NEGATIVE Final   Ketones, ur 04/30/2023 NEGATIVE  NEGATIVE mg/dL Final   Protein, ur 95/80/7974 30 (A)  NEGATIVE mg/dL Final   Nitrite 95/80/7974 NEGATIVE  NEGATIVE Final   Leukocytes,Ua 04/30/2023 TRACE (A)  NEGATIVE Final   RBC / HPF 04/30/2023 0-5  0 - 5 RBC/hpf Final   WBC, UA 04/30/2023 11-20  0 - 5 WBC/hpf Final   Bacteria, UA 04/30/2023 NONE SEEN  NONE SEEN Final   Squamous Epithelial / HPF 04/30/2023 0-5  0 - 5 /HPF Final   Mucus 04/30/2023 PRESENT   Final   Triple Phosphate Crystal 04/30/2023 PRESENT   Final   Performed at [facility], [address], [place], [place] [address]   HIV Screen 4th Generation wRfx 04/29/2023 Non Reactive  Non Reactive Final   Performed at [facility], [address], [place], [place] [address]   Hepatitis B Surface Ag 04/29/2023 NON REACTIVE  NON REACTIVE Final   HCV Ab 04/29/2023 NON REACTIVE  NON REACTIVE Final   Comment: (NOTE) Nonreactive HCV antibody screen is consistent with no HCV infections,  unless recent infection is suspected or other evidence exists to indicate HCV infection.     Hep A IgM 04/29/2023 NON REACTIVE  NON REACTIVE Final   Hep B C IgM 04/29/2023 NON REACTIVE  NON REACTIVE Final   Performed at [facility], [address], [place], [place] [address]   RPR Ser Ql 04/29/2023 NON REACTIVE  NON REACTIVE Final   Performed at [facility], [address], [place], [place] [address]  Admission on 04/27/2023, Discharged on 04/27/2023  Component Date Value Ref Range Status   WBC 04/27/2023 4.5  4.0 - 10.5 K/uL Final   RBC 04/27/2023 4.98  4.22 - 5.81 MIL/uL Final  Hemoglobin 04/27/2023 15.7  13.0 - 17.0 g/dL Final   HCT 95/83/7974 45.5  39.0 -  52.0 % Final   MCV 04/27/2023 91.4  80.0 - 100.0 fL Final   MCH 04/27/2023 31.5  26.0 - 34.0 pg Final   MCHC 04/27/2023 34.5  30.0 - 36.0 g/dL Final   RDW 95/83/7974 12.6  11.5 - 15.5 % Final   Platelets 04/27/2023 137 (L)  150 - 400 K/uL Final   nRBC 04/27/2023 0.0  0.0 - 0.2 % Final   Neutrophils Relative % 04/27/2023 36  % Final   Neutro Abs 04/27/2023 1.6 (L)  1.7 - 7.7 K/uL Final   Lymphocytes Relative 04/27/2023 55  % Final   Lymphs Abs 04/27/2023 2.5  0.7 - 4.0 K/uL Final   Monocytes Relative 04/27/2023 7  % Final   Monocytes Absolute 04/27/2023 0.3  0.1 - 1.0 K/uL Final   Eosinophils Relative 04/27/2023 2  % Final   Eosinophils Absolute 04/27/2023 0.1  0.0 - 0.5 K/uL Final   Basophils Relative 04/27/2023 0  % Final   Basophils Absolute 04/27/2023 0.0  0.0 - 0.1 K/uL Final   Immature Granulocytes 04/27/2023 0  % Final   Abs Immature Granulocytes 04/27/2023 0.01  0.00 - 0.07 K/uL Final   Performed at [facility], [address], [place], [place] [address]   Sodium 04/27/2023 141  135 - 145 mmol/L Final   Potassium 04/27/2023 3.7  3.5 - 5.1 mmol/L Final   Chloride 04/27/2023 104  98 - 111 mmol/L Final   CO2 04/27/2023 20 (L)  22 - 32 mmol/L Final   Glucose, Bld 04/27/2023 102 (H)  70 - 99 mg/dL Final   Glucose reference range applies only to samples taken after fasting for at least 8 hours.   BUN 04/27/2023 6  6 - 20 mg/dL Final   Creatinine, Ser 04/27/2023 0.78  0.61 - 1.24 mg/dL Final   Calcium 95/83/7974 8.9  8.9 - 10.3 mg/dL Final   Total Protein 95/83/7974 7.8  6.5 - 8.1 g/dL Final   Albumin 95/83/7974 4.1  3.5 - 5.0 g/dL Final   AST 95/83/7974 195 (H)  15 - 41 U/L Final   ALT 04/27/2023 144 (H)  0 - 44 U/L Final   Alkaline Phosphatase 04/27/2023 60  38 - 126 U/L Final   Total Bilirubin 04/27/2023 0.4  0.0 - 1.2 mg/dL Final   GFR, Estimated 04/27/2023 >60  >60 mL/min Final   Comment: (NOTE) Calculated using the CKD-EPI Creatinine Equation (2021)    Anion gap  04/27/2023 17 (H)  5 - 15 Final   Performed at [facility], [address] [place], [place] [address]   Hgb A1c MFr Bld 04/27/2023 5.2  4.8 - 5.6 % Final   Comment: (NOTE) Pre diabetes:          5.7%-6.4%  Diabetes:              >6.4%  Glycemic control for   <7.0% adults with diabetes    Mean Plasma Glucose 04/27/2023 102.54  mg/dL Final   Performed at [facility], [address] [place], [place] [address]   Alcohol, Ethyl (B) 04/27/2023 285 (H)  <10 mg/dL Final   Comment: (NOTE) For medical purposes only. Performed at [facility], [address], [place], [place] [address]    Cholesterol 04/27/2023 200  0 - 200 mg/dL Final   Triglycerides 95/83/7974 93  <150 mg/dL Final  HDL 04/27/2023 113  >40 mg/dL Final   Total CHOL/HDL Ratio 04/27/2023 1.8  RATIO Final   VLDL 04/27/2023 19  0 - 40 mg/dL Final   LDL Cholesterol 04/27/2023 68  0 - 99 mg/dL Final   Comment:        Total Cholesterol/HDL:CHD Risk Coronary Heart Disease Risk Table                     Men   Women  1/2 Average Risk   3.4   3.3  Average Risk       5.0   4.4  2 X Average Risk   9.6   7.1  3 X Average Risk  23.4   11.0        Use the calculated Patient Ratio above and the CHD Risk Table to determine the patient's CHD Risk.        ATP III CLASSIFICATION (LDL):  <100     mg/dL   Optimal  899-870  mg/dL   Near or Above                    Optimal  130-159  mg/dL   Borderline  839-810  mg/dL   High  >809     mg/dL   Very High Performed at [facility], [address], [place], [place] [address]    TSH 04/27/2023 0.202 (L)  0.350 - 4.500 uIU/mL Final   Comment: Performed by a 3rd Generation assay with a functional sensitivity of <=0.01 uIU/mL. Performed at [facility], [address] [place], [place] [address]    POC Amphetamine UR 04/27/2023 None Detected  NONE DETECTED (Cut Off Level 1000 ng/mL) Final   POC Secobarbital (BAR) 04/27/2023 None Detected  NONE DETECTED (Cut  Off Level 300 ng/mL) Final   POC Buprenorphine (BUP) 04/27/2023 None Detected  NONE DETECTED (Cut Off Level 10 ng/mL) Final   POC Oxazepam (BZO) 04/27/2023 None Detected  NONE DETECTED (Cut Off Level 300 ng/mL) Final   POC Cocaine UR 04/27/2023 None Detected  NONE DETECTED (Cut Off Level 300 ng/mL) Final   POC Methamphetamine UR 04/27/2023 None Detected  NONE DETECTED (Cut Off Level 1000 ng/mL) Final   POC Morphine  04/27/2023 None Detected  NONE DETECTED (Cut Off Level 300 ng/mL) Final   POC Methadone UR 04/27/2023 None Detected  NONE DETECTED (Cut Off Level 300 ng/mL) Final   POC Oxycodone UR 04/27/2023 None Detected  NONE DETECTED (Cut Off Level 100 ng/mL) Final   POC Marijuana UR 04/27/2023 None Detected  NONE DETECTED (Cut Off Level 50 ng/mL) Final    Blood Alcohol level:  Lab Results  Component Value Date   ETH 129 (H) 08/02/2023   ETH 285 (H) 04/27/2023    Metabolic Disorder Labs: Lab Results  Component Value Date   HGBA1C 5.2 04/27/2023   MPG 102.54 04/27/2023   MPG 99.67 02/08/2022   No results found for: PROLACTIN Lab Results  Component Value Date   CHOL 200 04/27/2023   TRIG 93 04/27/2023   HDL 113 04/27/2023   CHOLHDL 1.8 04/27/2023   VLDL 19 04/27/2023   LDLCALC 68 04/27/2023   LDLCALC 69 02/08/2022    Therapeutic Lab Levels: No results found for: LITHIUM No results found for: VALPROATE No results found for: CBMZ  Physical Findings   AIMS    Flowsheet Row [facility]  AIMS Total Score 0  AUDIT    Flowsheet Row [facility]  Alcohol Use Disorder Identification Test Final Score (AUDIT) 36   PHQ2-9    Flowsheet Row ED from 08/03/2023 in [facility] ED from 04/27/2023 in [facility] ED from 02/13/2022 in [facility]   PHQ-2 Total Score 6 0 2  PHQ-9 Total Score 23 -- 5   Flowsheet Row ED from 08/03/2023 in [facility] ED from 08/02/2023 in [facility] Emergency Department at [facility] ED from 04/27/2023 in [facility]  C-SSRS RISK CATEGORY High Risk High Risk High Risk     Musculoskeletal  Strength & Muscle Tone: within normal limits Gait & Station: normal Patient leans: N/A  Psychiatric Specialty Exam  Presentation  General Appearance:  Casual  Eye Contact: Good  Speech: Clear and Coherent  Speech Volume: Normal  Handedness: Right   Mood and Affect  Mood: Anxious  Affect: Congruent   Thought Process  Thought Processes: Coherent  Descriptions of Associations:Intact  Orientation:Full (Time, Place and Person)  Thought Content:Logical  Diagnosis of Schizophrenia or Schizoaffective disorder in past: No    Hallucinations:Hallucinations: None  Ideas of Reference:None  Suicidal Thoughts:Suicidal Thoughts: No  Homicidal Thoughts:Homicidal Thoughts: No   Sensorium  Memory: Immediate Good  Judgment: Fair  Insight: Fair   Art therapist  Concentration: Fair  Attention Span: Fair  Recall: Fair  Fund of Knowledge: Fair  Language: Fair   Psychomotor Activity  Psychomotor Activity: Psychomotor Activity: Normal   Assets  Assets: Desire for Improvement; Resilience   Sleep  Sleep: Sleep: Fair  Estimated Sleeping Duration (Last 24 Hours): 0.00 hours  Nutritional Assessment (For OBS and FBC admissions only) Has the patient had a weight loss or gain of 10 pounds or more in the last 3 months?: No Has the patient had a decrease in food intake/or appetite?: Yes Does the patient have dental problems?: No Does the patient have eating habits or behaviors that may be indicators of an eating disorder including binging or inducing vomiting?: No Has the patient recently lost weight  without trying?: 0 Has the patient been eating poorly because of a decreased appetite?: 0 Malnutrition Screening Tool Score: 0    Physical Exam  Physical Exam HENT:     Head: Normocephalic.  Eyes:     Pupils: Pupils are equal, round, and reactive to light.  Pulmonary:     Effort: Pulmonary effort is normal.    Review of Systems  Psychiatric/Behavioral:  Positive for depression, hallucinations (paranoia) and substance abuse. Negative for memory loss and suicidal ideas. The patient is nervous/anxious and has insomnia.   All other systems reviewed and are negative.  Blood pressure 132/77, pulse 62, temperature 98.7 F (37.1 C), temperature source Oral, resp. rate 18, SpO2 100%. There is no height or weight on file to calculate BMI.  Treatment Plan Summary: Daily contact with patient to assess and evaluate symptoms and progress in treatment and Medication management  Safety and Monitoring: Voluntary admission to inpatient psychiatric unit for safety, stabilization and treatment Daily contact with patient to assess and evaluate symptoms and progress in treatment Patient's case to be discussed in multi-disciplinary team meeting Observation Level : q15 minute checks Vital signs: q12 hours Precautions: Safety  Long Term Goal(s): Improvement in symptoms so as ready for discharge  Short Term Goals: Ability to identify changes in lifestyle to reduce recurrence  of condition will improve, Ability to verbalize feelings will improve, Ability to disclose and discuss suicidal ideas, Ability to demonstrate self-control will improve, Ability to identify and develop effective coping behaviors will improve, Ability to maintain clinical measurements within normal limits will improve, Compliance with prescribed medications will improve, and Ability to identify triggers associated with substance abuse/mental health issues will improve  Diagnoses Principal Problem:   Alcohol  abuse  Medications -Start Librium  taper-please see the [facility] for details -Discontinue Antabuse -Not effective for pt.-Reports vomiting and then purchasing alcohol thereafter to drink. States he would vomit each time he drank, but would purchase more alcohol and drink. -Restart Seroquel , and increase dose from 25 to 50 mg nightly for psychosis and mood stabilization.  PRNS -Continue Agitation protocol medications:Zyprexa  PO/IM as per the MAR -Continue Hydroxyzine  25 mg TID PRN for anxiety -Continue Tylenol  650 mg every 6 hours PRN for mild pain -Continue Maalox 30 mg every 4 hrs PRN for indigestion -Continue Milk of Magnesia as needed every 6 hrs for constipation  Labs Reviewed: LFTs seem to be chronically elevated most likely related to alcohol consumption. Will do hepatitis panel, TSH elevated in April of this year, will order FT3 & FT4. Last lipid panel was 3 months ago, so will repeat, and complete Vitamins D & B12.  Discharge Planning: Social work and case management to assist with discharge planning and identification of hospital follow-up needs prior to discharge Estimated LOS: 5-7 days Discharge Concerns: Need to establish a safety plan; Medication compliance and effectiveness Discharge Goals: Return home with outpatient referrals for mental health follow-up including medication management/psychotherapy  I certify that inpatient services furnished can reasonably be expected to improve the patient's condition.    Donia Snell, NP 7/23/20256:15 PM

## 2023-08-03 NOTE — ED Notes (Signed)
Pt is sleeping, no acute distress noted.

## 2023-08-03 NOTE — ED Notes (Signed)
 Pt observed using the unit telephone and interacting appropriately on the milieu. He denies SI/HI/AVH. He denies physical symptoms. He is seen drinking fluids well. He is safe on the unit at this time with Q15 minute safety checks in place.

## 2023-08-03 NOTE — ED Provider Notes (Signed)
 Facility Based Crisis Admission H&P  Date: 08/03/23 Patient Name: Thomas Vaughn MRN: [id number] Chief Complaint: needing detox  Diagnoses:  Final diagnoses:  Alcohol abuse with intoxication (HCC)  History of medication noncompliance    HPI: Thomas Vaughn, 33 y/o male with a history of alcohol abuse, paranoia, SI, presented to [facility] as a transfer from the ED for detox.  Review of patient records show multiple visit for detox.  Per consult note patient was initially admitted to the ED for suicidal ideation with attempt to overdose on alcohol.   Copied from consult notes: Thomas Vaughn is a 33 y.o. male admitted: Presented to the EDfor 08/02/2023 10:39 AM for suicidal ideations with an attempt to overdose on alcohol. He carries the psychiatric diagnoses of depression, and paranoia and has a past medical history of GERD.    His current presentation of spending a lot of time drinking, lack of appetite and sleep, tolerance to alcohol, BAL is 129 is most consistent with alcohol abuse and decompensating mental health. He meets criteria to be admitted to facility based crisis unit at behavioral health urgent care.  Current outpatient psychotropic medications include Seroquel , Atarax  and historically he has had a negative response to these medications. He was non compliant with medications prior to admission as evidenced by patient report. On initial examination, patient pleasant and cooperative  Face-to-face observation of patient, patient is alert and oriented x 4, speech is clear, maintain eye contact.  Patient is very nonchalant when talking.  Patient currently denies SI, HI, AVH or paranoia.  Patient reports consuming large quantity of alcohol on a daily basis.  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  Patient reports that he was at [facility] a couple months ago also.  Patient reported that he was taking medicine but the medicine did  not make him feel good so he stopped taking it.  At this present moment patient does not seem to be influenced by internal stimuli.   PHQ-9 completed patient scored 23  Recommend FBC   PHQ 2-9:  Flowsheet Row ED from 08/03/2023 in [facility] ED from 02/13/2022 in [facility]  Thoughts that you would be better off dead, or of hurting yourself in some way Nearly every day Not at all  PHQ-9 Total Score 23 5    Flowsheet Row ED from 08/02/2023 in [facility] ED from 04/27/2023 in [facility] UC from 03/16/2022 in [facility] at [facility] RISK CATEGORY High Risk High Risk No Risk      Total Time spent with patient: 30 minutes  Musculoskeletal  Strength & Muscle Tone: within normal limits Gait & Station: normal Patient leans: N/A  Psychiatric Specialty Exam  Presentation General Appearance:  Casual  Eye Contact: Good  Speech: Clear and Coherent  Speech Volume: Normal  Handedness: Right   Mood and Affect  Mood: Anxious  Affect: Congruent   Thought Process  Thought Processes: Coherent  Descriptions of Associations:Intact  Orientation:Full (Time, Place and Person)  Thought Content:Logical  Diagnosis of Schizophrenia or Schizoaffective disorder in past: No   Hallucinations:Hallucinations: None  Ideas of Reference:None  Suicidal Thoughts:Suicidal Thoughts: No  Homicidal Thoughts:Homicidal Thoughts: No   Sensorium  Memory: Immediate Good  Judgment: Fair  Insight: Fair   Art therapist  Concentration: Fair  Attention Span: Fair  Recall: [employer] of  Knowledge: Fair  Language: Fair   Psychomotor Activity  Psychomotor Activity: Psychomotor Activity: Normal   Assets  Assets: Desire for Improvement; Resilience   Sleep  Sleep: Sleep: Fair Number of Hours of Sleep:  4   Nutritional Assessment (For OBS and FBC admissions only) Has the patient had a weight loss or gain of 10 pounds or more in the last 3 months?: No Has the patient had a decrease in food intake/or appetite?: Yes Does the patient have dental problems?: No Does the patient have eating habits or behaviors that may be indicators of an eating disorder including binging or inducing vomiting?: No Has the patient recently lost weight without trying?: 0 Has the patient been eating poorly because of a decreased appetite?: 0 Malnutrition Screening Tool Score: 0    Physical Exam HENT:     Head: Normocephalic.     Nose: Nose normal.  Eyes:     Pupils: Pupils are equal, round, and reactive to light.  Cardiovascular:     Rate and Rhythm: Normal rate.  Pulmonary:     Effort: Pulmonary effort is normal.  Musculoskeletal:        General: Normal range of motion.     Cervical back: Normal range of motion.  Neurological:     General: No focal deficit present.     Mental Status: He is alert.  Psychiatric:        Mood and Affect: Mood normal.        Behavior: Behavior normal.        Thought Content: Thought content normal.        Judgment: Judgment normal.    Review of Systems  Constitutional: Negative.   HENT: Negative.    Eyes: Negative.   Respiratory: Negative.    Cardiovascular: Negative.   Gastrointestinal: Negative.   Genitourinary: Negative.   Musculoskeletal: Negative.   Skin: Negative.   Neurological: Negative.   Psychiatric/Behavioral:  Positive for substance abuse. The patient is nervous/anxious.     There were no vitals taken for this visit. There is no height or weight on file to calculate BMI.  Past Psychiatric History: Alcohol abuse, MDD, SI  Is the patient at risk to self? No  Has the patient been a risk to self in the past 6 months? Yes .    Has the patient been a risk to self within the distant past? Yes   Is the patient a risk to others? No   Has the patient  been a risk to others in the past 6 months? No   Has the patient been a risk to others within the distant past? No   Past Medical History: See chart Family History: Unknown Social History: Alcohol abuse  Last Labs:  Admission on 08/02/2023, Discharged on 08/02/2023  Component Date Value Ref Range Status   Alcohol, Ethyl (B) 08/02/2023 129 (H)  <15 mg/dL Final   Comment: (NOTE) For medical purposes only. Performed at [facility], [address] [place], [place] [address]    WBC 08/02/2023 3.5 (L)  4.0 - 10.5 K/uL Final   RBC 08/02/2023 5.22  4.22 - 5.81 MIL/uL Final   Hemoglobin 08/02/2023 16.1  13.0 - 17.0 g/dL Final   HCT 92/77/7974 50.2  39.0 - 52.0 % Final   MCV 08/02/2023 96.2  80.0 - 100.0 fL Final   MCH 08/02/2023 30.8  26.0 - 34.0 pg Final   MCHC 08/02/2023 32.1  30.0 - 36.0 g/dL Final   RDW 92/77/7974  15.7 (H)  11.5 - 15.5 % Final   Platelets 08/02/2023 204  150 - 400 K/uL Final   nRBC 08/02/2023 0.0  0.0 - 0.2 % Final   Neutrophils Relative % 08/02/2023 22  % Final   Neutro Abs 08/02/2023 0.8 (L)  1.7 - 7.7 K/uL Final   Lymphocytes Relative 08/02/2023 63  % Final   Lymphs Abs 08/02/2023 2.2  0.7 - 4.0 K/uL Final   Monocytes Relative 08/02/2023 11  % Final   Monocytes Absolute 08/02/2023 0.4  0.1 - 1.0 K/uL Final   Eosinophils Relative 08/02/2023 3  % Final   Eosinophils Absolute 08/02/2023 0.1  0.0 - 0.5 K/uL Final   Basophils Relative 08/02/2023 1  % Final   Basophils Absolute 08/02/2023 0.0  0.0 - 0.1 K/uL Final   RBC Morphology 08/02/2023 MORPHOLOGY UNREMARKABLE   Final   Smear Review 08/02/2023 MORPHOLOGY UNREMARKABLE   Final   Immature Granulocytes 08/02/2023 0  % Final   Abs Immature Granulocytes 08/02/2023 0.00  0.00 - 0.07 K/uL Final   Reactive, Benign Lymphocytes 08/02/2023 PRESENT   Final   Performed at [facility], [address] [place], [place] [address]   Color, Urine 08/02/2023 YELLOW  YELLOW Final    APPearance 08/02/2023 CLEAR  CLEAR Final   Specific Gravity, Urine 08/02/2023 1.021  1.005 - 1.030 Final   pH 08/02/2023 5.0  5.0 - 8.0 Final   Glucose, UA 08/02/2023 NEGATIVE  NEGATIVE mg/dL Final   Hgb urine dipstick 08/02/2023 NEGATIVE  NEGATIVE Final   Bilirubin Urine 08/02/2023 NEGATIVE  NEGATIVE Final   Ketones, ur 08/02/2023 5 (A)  NEGATIVE mg/dL Final   Protein, ur 92/77/7974 100 (A)  NEGATIVE mg/dL Final   Nitrite 92/77/7974 NEGATIVE  NEGATIVE Final   Leukocytes,Ua 08/02/2023 NEGATIVE  NEGATIVE Final   RBC / HPF 08/02/2023 0-5  0 - 5 RBC/hpf Final   WBC, UA 08/02/2023 0-5  0 - 5 WBC/hpf Final   Bacteria, UA 08/02/2023 NONE SEEN  NONE SEEN Final   Squamous Epithelial / HPF 08/02/2023 0-5  0 - 5 /HPF Final   Mucus 08/02/2023 PRESENT   Final   Hyaline Casts, UA 08/02/2023 PRESENT   Final   Performed at [facility], [address] [place], [place] [address]   Opiates 08/02/2023 NONE DETECTED  NONE DETECTED Final   Cocaine 08/02/2023 NONE DETECTED  NONE DETECTED Final   Benzodiazepines 08/02/2023 POSITIVE (A)  NONE DETECTED Final   Amphetamines 08/02/2023 NONE DETECTED  NONE DETECTED Final   Tetrahydrocannabinol 08/02/2023 NONE DETECTED  NONE DETECTED Final   Barbiturates 08/02/2023 NONE DETECTED  NONE DETECTED Final   Comment: (NOTE) DRUG SCREEN FOR MEDICAL PURPOSES ONLY.  IF CONFIRMATION IS NEEDED FOR ANY PURPOSE, NOTIFY LAB WITHIN 5 DAYS.  LOWEST DETECTABLE LIMITS FOR URINE DRUG SCREEN Drug Class                     Cutoff (ng/mL) Amphetamine and metabolites    1000 Barbiturate and metabolites    200 Benzodiazepine                 200 Opiates and metabolites        300 Cocaine and metabolites        300 THC                            50 Performed at [facility], [address] [place], [place] [address]    Acetaminophen  (Tylenol ), Serum 08/02/2023 <10 (L)  10 - 30 ug/mL Final   Comment: (NOTE) Therapeutic concentrations vary  significantly. A range of 10-30 ug/mL  may be an effective concentration for many patients. However, some  are best treated at concentrations outside of this range. Acetaminophen  concentrations >150 ug/mL at 4 hours after ingestion  and >50 ug/mL at 12 hours after ingestion are often associated with  toxic reactions.  Performed at [facility], [address], [place], [place] [address]    Salicylate Lvl 08/02/2023 <7.0 (L)  7.0 - 30.0 mg/dL Final   Performed at [facility] [address], [place], [place] [address]   Sodium 08/02/2023 144  135 - 145 mmol/L Final   Potassium 08/02/2023 3.8  3.5 - 5.1 mmol/L Final   Chloride 08/02/2023 107  98 - 111 mmol/L Final   CO2 08/02/2023 23  22 - 32 mmol/L Final   Glucose, Bld 08/02/2023 95  70 - 99 mg/dL Final   Glucose reference range applies only to samples taken after fasting for at least 8 hours.   BUN 08/02/2023 8  6 - 20 mg/dL Final   Creatinine, Ser 08/02/2023 0.76  0.61 - 1.24 mg/dL Final   Calcium 92/77/7974 9.2  8.9 - 10.3 mg/dL Final   Total Protein 92/77/7974 8.5 (H)  6.5 - 8.1 g/dL Final   Albumin 92/77/7974 4.1  3.5 - 5.0 g/dL Final   AST 92/77/7974 55 (H)  15 - 41 U/L Final   ALT 08/02/2023 50 (H)  0 - 44 U/L Final   Alkaline Phosphatase 08/02/2023 65  38 - 126 U/L Final   Total Bilirubin 08/02/2023 0.7  0.0 - 1.2 mg/dL Final   GFR, Estimated 08/02/2023 >60  >60 mL/min Final   Comment: (NOTE) Calculated using the CKD-EPI Creatinine Equation (2021)    Anion gap 08/02/2023 14  5 - 15 Final   Performed at [facility], [address], [place], [place] [address]   Magnesium  08/02/2023 1.9  1.7 - 2.4 mg/dL Final   Performed at [facility], [address] [place], [place] [address]   Glucose-Capillary 08/02/2023 126 (H)  70 - 99 mg/dL Final   Glucose reference range applies only to samples taken after fasting for at least 8 hours.  Admission on  04/27/2023, Discharged on 04/30/2023  Component Date Value Ref Range Status   Free T4 04/27/2023 0.70  0.61 - 1.12 ng/dL Final   Comment: (NOTE) Biotin ingestion may interfere with free T4 tests. If the results are inconsistent with the TSH level, previous test results, or the clinical presentation, then consider biotin interference. If needed, order repeat testing after stopping biotin. Performed at [facility], [address], [place], [place] [address]    T3, Free 04/29/2023 2.5  2.0 - 4.4 pg/mL Final   Comment: (NOTE) Performed At: [facility] [address] [place], [place] [id number] Jennette Shorter MD Ey:[id number]    Total Protein 04/29/2023 7.3  6.5 - 8.1 g/dL Final   Albumin 95/81/7974 3.3 (L)  3.5 - 5.0 g/dL Final   AST 95/81/7974 94 (H)  15 - 41 U/L Final   ALT 04/29/2023 95 (H)  0 - 44 U/L Final   Alkaline Phosphatase 04/29/2023 55  38 - 126 U/L Final   Total Bilirubin 04/29/2023 0.8  0.0 - 1.2 mg/dL Final   Bilirubin, Direct 04/29/2023 0.2  0.0 - 0.2  mg/dL Final   Indirect Bilirubin 04/29/2023 0.6  0.3 - 0.9 mg/dL Final   Performed at [facility], [address] [place], [place] [address]   Neisseria Gonorrhea 04/29/2023 Negative   Final   Chlamydia 04/29/2023 Negative   Final   Comment 04/29/2023 Normal Reference Ranger Chlamydia - Negative   Final   Comment 04/29/2023 Normal Reference Range Neisseria Gonorrhea - Negative   Final   Color, Urine 04/30/2023 YELLOW  YELLOW Final   APPearance 04/30/2023 CLEAR  CLEAR Final   Specific Gravity, Urine 04/30/2023 1.023  1.005 - 1.030 Final   pH 04/30/2023 7.0  5.0 - 8.0 Final   Glucose, UA 04/30/2023 NEGATIVE  NEGATIVE mg/dL Final   Hgb urine dipstick 04/30/2023 NEGATIVE  NEGATIVE Final   Bilirubin Urine 04/30/2023 NEGATIVE  NEGATIVE Final   Ketones, ur 04/30/2023 NEGATIVE  NEGATIVE mg/dL Final   Protein, ur 95/80/7974 30 (A)  NEGATIVE mg/dL Final   Nitrite 95/80/7974 NEGATIVE  NEGATIVE Final    Leukocytes,Ua 04/30/2023 TRACE (A)  NEGATIVE Final   RBC / HPF 04/30/2023 0-5  0 - 5 RBC/hpf Final   WBC, UA 04/30/2023 11-20  0 - 5 WBC/hpf Final   Bacteria, UA 04/30/2023 NONE SEEN  NONE SEEN Final   Squamous Epithelial / HPF 04/30/2023 0-5  0 - 5 /HPF Final   Mucus 04/30/2023 PRESENT   Final   Triple Phosphate Crystal 04/30/2023 PRESENT   Final   Performed at [facility], [address], [place], [place] [address]   HIV Screen 4th Generation wRfx 04/29/2023 Non Reactive  Non Reactive Final   Performed at [facility], [address], [place], [place] [address]   Hepatitis B Surface Ag 04/29/2023 NON REACTIVE  NON REACTIVE Final   HCV Ab 04/29/2023 NON REACTIVE  NON REACTIVE Final   Comment: (NOTE) Nonreactive HCV antibody screen is consistent with no HCV infections,  unless recent infection is suspected or other evidence exists to indicate HCV infection.     Hep A IgM 04/29/2023 NON REACTIVE  NON REACTIVE Final   Hep B C IgM 04/29/2023 NON REACTIVE  NON REACTIVE Final   Performed at [facility], [address], [place], [place] [address]   RPR Ser Ql 04/29/2023 NON REACTIVE  NON REACTIVE Final   Performed at [facility], [address] [place], [place] [address]  Admission on 04/27/2023, Discharged on 04/27/2023  Component Date Value Ref Range Status   WBC 04/27/2023 4.5  4.0 - 10.5 K/uL Final   RBC 04/27/2023 4.98  4.22 - 5.81 MIL/uL Final   Hemoglobin 04/27/2023 15.7  13.0 - 17.0 g/dL Final   HCT 95/83/7974 45.5  39.0 - 52.0 % Final   MCV 04/27/2023 91.4  80.0 - 100.0 fL Final   MCH 04/27/2023 31.5  26.0 - 34.0 pg Final   MCHC 04/27/2023 34.5  30.0 - 36.0 g/dL Final   RDW 95/83/7974 12.6  11.5 - 15.5 % Final   Platelets 04/27/2023 137 (L)  150 - 400 K/uL Final   nRBC 04/27/2023 0.0  0.0 - 0.2 % Final   Neutrophils Relative % 04/27/2023 36  % Final   Neutro Abs 04/27/2023 1.6 (L)  1.7 - 7.7 K/uL Final   Lymphocytes Relative 04/27/2023 55  % Final    Lymphs Abs 04/27/2023 2.5  0.7 - 4.0 K/uL Final   Monocytes Relative 04/27/2023 7  % Final   Monocytes Absolute 04/27/2023 0.3  0.1 -  1.0 K/uL Final   Eosinophils Relative 04/27/2023 2  % Final   Eosinophils Absolute 04/27/2023 0.1  0.0 - 0.5 K/uL Final   Basophils Relative 04/27/2023 0  % Final   Basophils Absolute 04/27/2023 0.0  0.0 - 0.1 K/uL Final   Immature Granulocytes 04/27/2023 0  % Final   Abs Immature Granulocytes 04/27/2023 0.01  0.00 - 0.07 K/uL Final   Performed at [facility], [address] [place], [place] [address]   Sodium 04/27/2023 141  135 - 145 mmol/L Final   Potassium 04/27/2023 3.7  3.5 - 5.1 mmol/L Final   Chloride 04/27/2023 104  98 - 111 mmol/L Final   CO2 04/27/2023 20 (L)  22 - 32 mmol/L Final   Glucose, Bld 04/27/2023 102 (H)  70 - 99 mg/dL Final   Glucose reference range applies only to samples taken after fasting for at least 8 hours.   BUN 04/27/2023 6  6 - 20 mg/dL Final   Creatinine, Ser 04/27/2023 0.78  0.61 - 1.24 mg/dL Final   Calcium 95/83/7974 8.9  8.9 - 10.3 mg/dL Final   Total Protein 95/83/7974 7.8  6.5 - 8.1 g/dL Final   Albumin 95/83/7974 4.1  3.5 - 5.0 g/dL Final   AST 95/83/7974 195 (H)  15 - 41 U/L Final   ALT 04/27/2023 144 (H)  0 - 44 U/L Final   Alkaline Phosphatase 04/27/2023 60  38 - 126 U/L Final   Total Bilirubin 04/27/2023 0.4  0.0 - 1.2 mg/dL Final   GFR, Estimated 04/27/2023 >60  >60 mL/min Final   Comment: (NOTE) Calculated using the CKD-EPI Creatinine Equation (2021)    Anion gap 04/27/2023 17 (H)  5 - 15 Final   Performed at [facility], [address], [place], [place] [address]   Hgb A1c MFr Bld 04/27/2023 5.2  4.8 - 5.6 % Final   Comment: (NOTE) Pre diabetes:          5.7%-6.4%  Diabetes:              >6.4%  Glycemic control for   <7.0% adults with diabetes    Mean Plasma Glucose 04/27/2023 102.54  mg/dL Final   Performed at [facility], [address] [place], [place] [address]    Alcohol, Ethyl (B) 04/27/2023 285 (H)  <10 mg/dL Final   Comment: (NOTE) For medical purposes only. Performed at [facility], [address] [place], [place] [address]    Cholesterol 04/27/2023 200  0 - 200 mg/dL Final   Triglycerides 95/83/7974 93  <150 mg/dL Final   HDL 95/83/7974 113  >40 mg/dL Final   Total CHOL/HDL Ratio 04/27/2023 1.8  RATIO Final   VLDL 04/27/2023 19  0 - 40 mg/dL Final   LDL Cholesterol 04/27/2023 68  0 - 99 mg/dL Final   Comment:        Total Cholesterol/HDL:CHD Risk Coronary Heart Disease Risk Table                     Men   Women  1/2 Average Risk   3.4   3.3  Average Risk       5.0   4.4  2 X Average Risk   9.6   7.1  3 X Average Risk  23.4   11.0        Use the calculated Patient Ratio above and the CHD Risk Table to determine the patient's CHD Risk.  ATP III CLASSIFICATION (LDL):  <100     mg/dL   Optimal  899-870  mg/dL   Near or Above                    Optimal  130-159  mg/dL   Borderline  839-810  mg/dL   High  >809     mg/dL   Very High Performed at [facility], [address] [place], [place] [address]    TSH 04/27/2023 0.202 (L)  0.350 - 4.500 uIU/mL Final   Comment: Performed by a 3rd Generation assay with a functional sensitivity of <=0.01 uIU/mL. Performed at [facility], [address] [place], [place] [address]    POC Amphetamine UR 04/27/2023 None Detected  NONE DETECTED (Cut Off Level 1000 ng/mL) Final   POC Secobarbital (BAR) 04/27/2023 None Detected  NONE DETECTED (Cut Off Level 300 ng/mL) Final   POC Buprenorphine (BUP) 04/27/2023 None Detected  NONE DETECTED (Cut Off Level 10 ng/mL) Final   POC Oxazepam (BZO) 04/27/2023 None Detected  NONE DETECTED (Cut Off Level 300 ng/mL) Final   POC Cocaine UR 04/27/2023 None Detected  NONE DETECTED (Cut Off Level 300 ng/mL) Final   POC Methamphetamine UR 04/27/2023 None Detected  NONE DETECTED (Cut Off Level 1000 ng/mL) Final   POC Morphine  04/27/2023 None  Detected  NONE DETECTED (Cut Off Level 300 ng/mL) Final   POC Methadone UR 04/27/2023 None Detected  NONE DETECTED (Cut Off Level 300 ng/mL) Final   POC Oxycodone UR 04/27/2023 None Detected  NONE DETECTED (Cut Off Level 100 ng/mL) Final   POC Marijuana UR 04/27/2023 None Detected  NONE DETECTED (Cut Off Level 50 ng/mL) Final    Allergies: Other and [place]  Medications:  PTA Medications  Medication Sig   nicotine  (NICODERM CQ  - DOSED IN MG/24 HOURS) 14 mg/24hr patch Place 1 patch (14 mg total) onto the skin daily.   QUEtiapine  (SEROQUEL ) 25 MG tablet Take 1 tablet (25 mg total) by mouth at bedtime. (Patient not taking: Reported on 08/02/2023)   thiamine  (VITAMIN B-1) 100 MG tablet Take 1 tablet (100 mg total) by mouth daily. (Patient not taking: Reported on 08/02/2023)   hydrOXYzine  (ATARAX ) 25 MG tablet Take 1 tablet (25 mg total) by mouth 3 (three) times daily as needed for anxiety. (Patient not taking: Reported on 08/02/2023)   ketoconazole  (NIZORAL ) 2 % cream Apply 1 Application topically daily. (Patient not taking: Reported on 08/02/2023)    Long Term Goals: Improvement in symptoms so as ready for discharge  Short Term Goals: Patient will verbalize feelings in meetings with treatment team members., Patient will attend at least of 50% of the groups daily., Pt will complete the PHQ9 on admission, day 3 and discharge., Patient will participate in completing the [place] Suicide Severity Rating Scale, Patient will score a low risk of violence for 24 hours prior to discharge, and Patient will take medications as prescribed daily.  Medical Decision Making  Recommend [facility]    Recommendations  Based on my evaluation the patient does not appear to have an emergency medical condition.  Gaither Pouch, NP 08/03/23  1:06 AM

## 2023-08-03 NOTE — BH Assessment (Signed)
 LCSW met with patient to assess current mood, affect, physical state, and inquire about needs/goals while here in [facility] and after discharge. Patient reports he presented due to paranoia, alcohol abuse with SI.  Patient reports drinking one pint of alcohol daily and since age 33. Patient stated his longest sobriety has been for 5 months. Patient stated he followed up with [facility] outpatient services for one day and had medications prescribed he stopped taking due to adverse effects. Patient thinks it may have been antabuse .   Patient lives with his girlfriend and has 8 children whom he is not associated with but pays child support for a couple. Patient stated he is unable to maintain employment due to his drinking and donates plasma for money for child support and alcohol.  Patient reports he was seeking resiential treatment and had not tried in thepast but has a friend who is heavily involved with AA and plans to attend those with him. SW explained that due to his pre-dominant mental health concerns with ongoing paranoia for a reported past 4 years combined with SI he would not be eligible for a residential stay at this time but could follow up post discharge and this SW will provide him with the specific programs to call.  Patient voicing full agreement to be referred to psychiatry and individual therapy provider in his area for outpatient care when he is stable for DC.   Patient currently denies any SI/HI/AVH and reports mood as ca;lm and cooperative. Patient expressed understanding and appreciation of LCSW assistance. No other needs were reported at this time by patient.

## 2023-08-03 NOTE — Discharge Planning (Signed)
 SW made call to [facility] in patients area of  for information regarding their SAIOP process. Instructions are listed in patients AVS for walk in assessment to be completed. Patient is here for medication stabilization and detox and plans to return home and follow up with outpatient services for [facility] including medication management. Patient plans to pursue residential treatment in the future and SW will provide list of treatment centers on his AVS for discharge when stable.

## 2023-08-03 NOTE — Discharge Instructions (Addendum)
 Based on the information you have provided and the presenting issue, outpatient services and resources have been recommended.  It is imperative that you follow through with treatment recommendations within 5-7 days from the of discharge to mitigate further risk to your safety and mental well-being. A list of referrals has been provided below to get you started.  You are not limited to the list provided.  In case of an urgent crisis, you may contact the Mobile Crisis Unit with [facility] at [phone].   [facility] Address: [address] [place], [place] [address] Hours: Open 24 hours Phone: [phone]  You will need to come as a walk in from 8:00-2:00 and try to arrive as closest to 8 in order to be seen for initial evaluation to be scheduled for ongoing psychiatry for medication management and individual therapy.   Local AA meetings documents provided to patient for your local area.   Residential treatment programs to follow up with after mental concerns are more stable.  The residential programs who accept [facility] are: [facility] [phone]) [facility]-contact person for admissions is Rosaline [phone]) and [facility] in [place] [phone]) and will need to complete a short application found on this ling below. Contact person for Odie is [employer]. [email or web address]      Residential Treatment  Facilities Medicaid Detox No Insurance Engineer, site ([facility]) [address] [place], [place] [phone] or [phone]   No  Yes  Yes  Yes   [facility] [phone] [place], [place] [address] [phone] Admissions: 8am-3pm  M-F   [place] only  No  Yes  No    [facility] [phone]   No  Yes  No- out of pocket 16,000  Yes   [facility] ([facility]idential  Treatment Services) [address] [place], [place] [phone]   Yes- No medicare  Yes   Yes, [place]   [address] only    [address] [place], [place], [address] [phone]   No  No  Yes but private pay, offers some sponsorships  Does not take insurance    [facility] [place], [place] [phone]       No  No  Yes- [place] and [place] out of pocket if not in those counties. 3,220.00 for 28 days.   No   Residential Treatment  Facilities   Medicaid  Detox  No IT trainer (multiple locations throughout the country)  Intake: [phone]    No   Yes   No   Yes   [facility]  [facility] [place], [place] [phone] (takes everyone as long as they meet detox criteria)   Yes  Yes   Yes  Yes   [address] [place], [place]  [phone] 27 locations    No  No- sober living house  90.00-130.00 per week per person  [facility] Part of [place] Outreach [phone] [email or web address] [facility] Part of [place] Outreach [phone] [email or web address]    No   [facility] [address]  [place] [phone] [email or web address]     No  No  1,200.00 a 200.00 deposit is required at start of treatment Payment plans accepted **Christian based program  No   Residential Treatment  Facilities   Medicaid  Detox  No [employer]  [facility] [place] [address]  [place], [place], [address] [phone]  No  Yes       Yes  Regular Rehab: 7,500.00 28 days Dual Diagnosis: 8,900.00 28 days 7 day detox: 2.700.00 or 3,400.00 for Dual Diagnosis   Yes   Outpatient Treatment  Facilities Medicaid Detox No [facility]   [facility] [address]  [place], [place], [address] [phone]   No  No   No  Yes    [facility]  (If substance abuse is secondary diagnosis) [address],  [place], [place] [address] [phone]   Yes-[place] Only for Partial   No   No   Yes- IOP  [facility]  [address] [place] [place], [place] [phone] (offers adult AND adolescent Intensive Outpatient services) (also in [place], [place], [place] and [place])      No No IOP- does some sponsorships on individual basis Yes

## 2023-08-03 NOTE — ED Notes (Signed)
 Pt sleeping in no acute distress. RR even and unlabored. Environment secured. Will continue to monitor for safety.

## 2023-08-04 DIAGNOSIS — F1994 Other psychoactive substance use, unspecified with psychoactive substance-induced mood disorder: Secondary | ICD-10-CM | POA: Diagnosis not present

## 2023-08-04 DIAGNOSIS — F10129 Alcohol abuse with intoxication, unspecified: Secondary | ICD-10-CM | POA: Diagnosis not present

## 2023-08-04 DIAGNOSIS — F22 Delusional disorders: Secondary | ICD-10-CM | POA: Diagnosis not present

## 2023-08-04 LAB — LIPID PANEL
Cholesterol: 155 mg/dL (ref 0–200)
HDL: 86 mg/dL (ref >40)
LDL Cholesterol: 50 mg/dL (ref 0–99)
Total CHOL/HDL Ratio: 1.8 ratio
Triglycerides: 93 mg/dL (ref <150)
VLDL: 19 mg/dL (ref 0–40)

## 2023-08-04 LAB — T4, FREE: Free T4: 0.77 ng/dL (ref 0.61–1.12)

## 2023-08-04 LAB — VITAMIN B12: Vitamin B-12: 254 pg/mL (ref 180–914)

## 2023-08-04 LAB — VITAMIN D 25 HYDROXY (VIT D DEFICIENCY, FRACTURES): Vit D, 25-Hydroxy: 14.25 ng/mL — ABNORMAL LOW (ref 30–100)

## 2023-08-04 LAB — TSH: TSH: 2.934 u[IU]/mL (ref 0.350–4.500)

## 2023-08-04 MED ORDER — QUETIAPINE FUMARATE 50 MG PO TABS
50.0000 mg | ORAL_TABLET | Freq: Every day | ORAL | Status: DC
  Administered 2023-08-04: 50 mg via ORAL
  Filled 2023-08-04: qty 1

## 2023-08-04 NOTE — Group Note (Signed)
 Group Topic: Wellness  Group Date: 08/04/2023 Start Time: 1800 End Time: 1830 Facilitators: Daved Tinnie HERO, RN  Department: [facility]  Number of Participants: 10  Group Focus: nursing group Treatment Modality:  Psychoeducation Interventions utilized were patient education Purpose: reinforce self-care  Name: Thomas Vaughn Date of Birth: 04/30/1990  MR: [id number]    Level of Participation: moderate Quality of Participation: cooperative Interactions with others: gave feedback Mood/Affect: appropriate Triggers (if applicable): n/a Cognition: coherent/clear Progress: Gaining insight Response: RN reviewed medications with pt, questions denied Plan: patient will be encouraged to attend future RN education groups  Patients Problems:  Patient Active Problem List   Diagnosis Date Noted   Alcohol abuse 04/16/2017   Bipolar disorder, unspecified (HCC) 04/15/2017   ADHD (attention deficit hyperactivity disorder)    HYPOKALEMIA 07/29/2009   ANEMIA OF OTHER CHRONIC DISEASE 07/29/2009   GYNECOMASTIA 07/29/2009   CHEST PAIN UNSPECIFIED 07/29/2009

## 2023-08-04 NOTE — ED Notes (Signed)
 Labs collected and sent to lab at 0508. Pt tolerated well. He is safe on the unit at this time. Q 15 min safety checks in place.

## 2023-08-04 NOTE — Group Note (Signed)
 Group Topic: Coping Skills  Group Date: 08/04/2023 Start Time: 1500 End Time: 1600 Facilitators: [place] Stephane HERO, [place]  Department: [facility]  Number of Participants: 9  Group Focus: anger management, anxiety, clarity of thought, coping skills, and feeling awareness/expression Treatment Modality:  Psychoeducation Interventions utilized were clarification, exploration, patient education, and problem solving Purpose: enhance coping skills, explore maladaptive thinking, express feelings, increase insight, reinforce self-care, relapse prevention strategies, and trigger / craving management  Name: Thomas Vaughn Date of Birth: February 14, 1990  MR: [id number]    Level of Participation: active Quality of Participation: attentive, cooperative, engaged, and motivated Interactions with others: gave feedback Mood/Affect: appropriate, brightens with interaction, and positive Triggers (if applicable): n/a Cognition: coherent/clear and insightful Progress: Gaining insight Response: Gave supportive and useful feedback to facilitator and group members Plan: referral / recommendations  Patients Problems:  Patient Active Problem List   Diagnosis Date Noted   Alcohol abuse 04/16/2017   Bipolar disorder, unspecified (HCC) 04/15/2017   ADHD (attention deficit hyperactivity disorder)    HYPOKALEMIA 07/29/2009   ANEMIA OF OTHER CHRONIC DISEASE 07/29/2009   GYNECOMASTIA 07/29/2009   CHEST PAIN UNSPECIFIED 07/29/2009

## 2023-08-04 NOTE — ED Notes (Signed)
 Pt c/o trouble sleeping and nightmares. PRN Atarax  given for anxiety. He is safe on the unit at this time with Q 15 min safety checks in place.

## 2023-08-04 NOTE — ED Notes (Addendum)
 Pt administered prn tylenol  for c/o level 7 left lower back pain x two weeks. Pt says pain is worse at night. Pt currently reports no improvement or worsening of pain level. Pt currently resting in room, reported to not have slept last night. Pt denies si hi avh- verbal contract for safety provided.

## 2023-08-04 NOTE — ED Notes (Signed)
 Patient is sleeping. Respirations equal and unlabored. No change in assessment or acuity. Routine safety checks conducted according to facility protocol.

## 2023-08-04 NOTE — ED Notes (Signed)
 Patient observed/assessed at nursing station. Patient alert and oriented x 4. Affect is bright. Patient denies pain and anxiety. He denies A/V/H. He denies having any thoughts/plan of self harm and harm towards others. Fluid and snack offered. Patient states that appetite has been good throughout the day. Verbalizes no further complaints at this time.

## 2023-08-04 NOTE — Progress Notes (Signed)
 Pt complaining of sleep disturbance of over a decade and feeling paranoid and someone's watching me for over a decade. He does not know who. Pt stated that at times he hears his mom calling his name, the last time he heard this was yesterday. He denies AVH at this time. He stated that he stays up at night and drinks alcohol due to his paranoia. He is currently up in the dayroom drawing on a sheet of paper. AAOx4. He is safe on the unit at this time. Q 15 min safety checks in place.

## 2023-08-04 NOTE — ED Provider Notes (Signed)
 Behavioral Health Progress Note  Date and Time: 08/04/2023 12:10 PM Name: Thomas Vaughn MRN:  [id number]  Subjective:  Thomas Vaughn is a 33 year old male patient with a past psychiatric history significant for alcohol use disorder, MDD and GAD who presented to the [facility] on 08/03/23 with complaints of suicidal thoughts with no plan or intent, alcohol abuse, drinking a pint of vodka daily and 4 locos and abusing Xanax  and Valium from off the street. BAL on arrival was 129. UDS was pos for benzodiazepines.   Today, patient continues to reports withdrawal symptoms of decreased appetite, cold sweats, nausea, tremors, increased anxiety, irritability and poor sleep. He states that he was up all night because he could not sleep and continues to have paranoia. He states that he has been paranoid for a long time and that he is afraid to leave the house and will not leave the house to go to the store or out to eat with his girlfriend or family because he always feel like someone is going to do something to him and he's always looking over his shoulders. He states that he tries to sleep during the daytime when the sun comes up because of the paranoia. He states that when he's home he paces at night. He reports feeling paranoid since his brother was murdered four years ago. He describes his mood today as feeling depressed, frustrated and mad which she feels is mostly related to him withdrawing from substances. He denies suicidal thoughts. He denies homicidal thoughts. He denies auditory or visual hallucinations. He denies medication side effects.   Diagnosis:  Final diagnoses:  Alcohol abuse with intoxication (HCC)  History of medication noncompliance    Total Time spent with patient: 30 minutes  Past Psychiatric History: History of alcohol use disorder, MDD and GAD Past Medical History: No reported history Family History: Per chart review, family history of  cardiac disease Family Psychiatric  History: Per chart review, family history of bipolar disorder Social History: Patient resides with his girlfriend and girlfriend has 7 children that he helps to take care of. He has a children 8 of his own.  Current Medications:  Current Facility-Administered Medications  Medication Dose Route Frequency Provider Last Rate Last Admin   acetaminophen  (TYLENOL ) tablet 650 mg  650 mg Oral Q6H PRN Trudy Carwin, NP   650 mg at 08/04/23 0915   alum & mag hydroxide-simeth (MAALOX/MYLANTA) 200-200-20 MG/5ML suspension 30 mL  30 mL Oral Q4H PRN Trudy Carwin, NP       chlordiazePOXIDE  (LIBRIUM ) capsule 25 mg  25 mg Oral Q6H PRN Tex Drilling, NP       chlordiazePOXIDE  (LIBRIUM ) capsule 25 mg  25 mg Oral TID Tex Drilling, NP       Followed by   NOREEN ON 08/05/2023] chlordiazePOXIDE  (LIBRIUM ) capsule 25 mg  25 mg Oral BH-qamhs Nkwenti, Doris, NP       Followed by   NOREEN ON 08/06/2023] chlordiazePOXIDE  (LIBRIUM ) capsule 25 mg  25 mg Oral Daily Nkwenti, Drilling, NP       hydrOXYzine  (ATARAX ) tablet 25 mg  25 mg Oral Q6H PRN Trudy Carwin, NP   25 mg at 08/04/23 0158   loperamide  (IMODIUM ) capsule 2-4 mg  2-4 mg Oral PRN Trudy Carwin, NP       magnesium  hydroxide (MILK OF MAGNESIA) suspension 30 mL  30 mL Oral Daily PRN Trudy Carwin, NP       multivitamin with minerals tablet 1 tablet  1 tablet Oral Daily Trudy Carwin, NP   1 tablet at 08/04/23 9085   OLANZapine  (ZYPREXA ) injection 10 mg  10 mg Intramuscular TID PRN Trudy Carwin, NP       OLANZapine  (ZYPREXA ) injection 5 mg  5 mg Intramuscular TID PRN Trudy Carwin, NP       OLANZapine  zydis (ZYPREXA ) disintegrating tablet 5 mg  5 mg Oral TID PRN Trudy Carwin, NP       ondansetron  (ZOFRAN -ODT) disintegrating tablet 4 mg  4 mg Oral Q6H PRN Trudy Carwin, NP   4 mg at 08/03/23 9065   thiamine  (VITAMIN B1) injection 100 mg  100 mg Intramuscular Once Trudy Carwin, NP       Current Outpatient Medications  Medication  Sig Dispense Refill   hydrOXYzine  (ATARAX ) 25 MG tablet Take 1 tablet (25 mg total) by mouth 3 (three) times daily as needed for anxiety. (Patient not taking: Reported on 08/02/2023) 30 tablet 0   ketoconazole  (NIZORAL ) 2 % cream Apply 1 Application topically daily. (Patient not taking: Reported on 08/02/2023) 15 g 0   nicotine  (NICODERM CQ  - DOSED IN MG/24 HOURS) 14 mg/24hr patch Place 1 patch (14 mg total) onto the skin daily. 28 patch 0   QUEtiapine  (SEROQUEL ) 25 MG tablet Take 1 tablet (25 mg total) by mouth at bedtime. (Patient not taking: Reported on 08/02/2023) 14 tablet 0   thiamine  (VITAMIN B-1) 100 MG tablet Take 1 tablet (100 mg total) by mouth daily. (Patient not taking: Reported on 08/02/2023) 30 tablet 0    Labs  Lab Results:  Admission on 08/03/2023  Component Date Value Ref Range Status   Cholesterol 08/04/2023 155  0 - 200 mg/dL Final   Triglycerides 92/75/7974 93  <150 mg/dL Final   HDL 92/75/7974 86  >40 mg/dL Final   Total CHOL/HDL Ratio 08/04/2023 1.8  RATIO Final   VLDL 08/04/2023 19  0 - 40 mg/dL Final   LDL Cholesterol 08/04/2023 50  0 - 99 mg/dL Final   Comment:        Total Cholesterol/HDL:CHD Risk Coronary Heart Disease Risk Table                     Men   Women  1/2 Average Risk   3.4   3.3  Average Risk       5.0   4.4  2 X Average Risk   9.6   7.1  3 X Average Risk  23.4   11.0        Use the calculated Patient Ratio above and the CHD Risk Table to determine the patient's CHD Risk.        ATP III CLASSIFICATION (LDL):  <100     mg/dL   Optimal  899-870  mg/dL   Near or Above                    Optimal  130-159  mg/dL   Borderline  839-810  mg/dL   High  >809     mg/dL   Very High Performed at [facility], [address], [place], [place] [address]    Vit D, 25-Hydroxy 08/04/2023 14.25 (L)  30 - 100 ng/mL Final   Comment: (NOTE) Vitamin D  deficiency has been defined by the Institute of Medicine  and an Endocrine Society practice guideline as  a level of serum 25-OH  vitamin D  less than 20 ng/mL (1,2). The Endocrine Society went on to  further  define vitamin D  insufficiency as a level between 21 and 29  ng/mL (2).  1. IOM (Institute of Medicine). 2010. Dietary reference intakes for  calcium and D. [place]: The [employer]. 2. Holick MF, Binkley [place], Bischoff-Ferrari HA, et al. Evaluation,  treatment, and prevention of vitamin D  deficiency: an Endocrine  Society clinical practice guideline, JCEM. 2011 Jul; 96(7): 1911-30.  Performed at [facility], [address], [place], [place] [address]    Vitamin B-12 08/04/2023 254  180 - 914 pg/mL Final   Comment: (NOTE) This assay is not validated for testing neonatal or myeloproliferative syndrome specimens for Vitamin B12 levels. Performed at [facility], [address] [place], [place] [address]    Free T4 08/04/2023 0.77  0.61 - 1.12 ng/dL Final   Comment: (NOTE) Biotin ingestion may interfere with free T4 tests. If the results are inconsistent with the TSH level, previous test results, or the clinical presentation, then consider biotin interference. If needed, order repeat testing after stopping biotin. Performed at [facility], [address], [place], [place] [address]    TSH 08/04/2023 2.934  0.350 - 4.500 uIU/mL Final   Comment: Performed by a 3rd Generation assay with a functional sensitivity of <=0.01 uIU/mL. Performed at [facility], [address], [place], [place] [address]   Admission on 08/02/2023, Discharged on 08/02/2023  Component Date Value Ref Range Status   Alcohol, Ethyl (B) 08/02/2023 129 (H)  <15 mg/dL Final   Comment: (NOTE) For medical purposes only. Performed at [facility], [address] [place], [place] [address]    WBC 08/02/2023 3.5 (L)  4.0 - 10.5 K/uL Final   RBC 08/02/2023 5.22  4.22 - 5.81 MIL/uL Final   Hemoglobin 08/02/2023 16.1  13.0 - 17.0 g/dL Final   HCT  92/77/7974 50.2  39.0 - 52.0 % Final   MCV 08/02/2023 96.2  80.0 - 100.0 fL Final   MCH 08/02/2023 30.8  26.0 - 34.0 pg Final   MCHC 08/02/2023 32.1  30.0 - 36.0 g/dL Final   RDW 92/77/7974 15.7 (H)  11.5 - 15.5 % Final   Platelets 08/02/2023 204  150 - 400 K/uL Final   nRBC 08/02/2023 0.0  0.0 - 0.2 % Final   Neutrophils Relative % 08/02/2023 22  % Final   Neutro Abs 08/02/2023 0.8 (L)  1.7 - 7.7 K/uL Final   Lymphocytes Relative 08/02/2023 63  % Final   Lymphs Abs 08/02/2023 2.2  0.7 - 4.0 K/uL Final   Monocytes Relative 08/02/2023 11  % Final   Monocytes Absolute 08/02/2023 0.4  0.1 - 1.0 K/uL Final   Eosinophils Relative 08/02/2023 3  % Final   Eosinophils Absolute 08/02/2023 0.1  0.0 - 0.5 K/uL Final   Basophils Relative 08/02/2023 1  % Final   Basophils Absolute 08/02/2023 0.0  0.0 - 0.1 K/uL Final   RBC Morphology 08/02/2023 MORPHOLOGY UNREMARKABLE   Final   Smear Review 08/02/2023 MORPHOLOGY UNREMARKABLE   Final   Immature Granulocytes 08/02/2023 0  % Final   Abs Immature Granulocytes 08/02/2023 0.00  0.00 - 0.07 K/uL Final   Reactive, Benign Lymphocytes 08/02/2023 PRESENT   Final   Performed at [facility], [address] [place], [place] [address]   Color, Urine 08/02/2023 YELLOW  YELLOW Final   APPearance 08/02/2023 CLEAR  CLEAR Final   Specific Gravity, Urine 08/02/2023 1.021  1.005 - 1.030 Final   pH  08/02/2023 5.0  5.0 - 8.0 Final   Glucose, UA 08/02/2023 NEGATIVE  NEGATIVE mg/dL Final   Hgb urine dipstick 08/02/2023 NEGATIVE  NEGATIVE Final   Bilirubin Urine 08/02/2023 NEGATIVE  NEGATIVE Final   Ketones, ur 08/02/2023 5 (A)  NEGATIVE mg/dL Final   Protein, ur 92/77/7974 100 (A)  NEGATIVE mg/dL Final   Nitrite 92/77/7974 NEGATIVE  NEGATIVE Final   Leukocytes,Ua 08/02/2023 NEGATIVE  NEGATIVE Final   RBC / HPF 08/02/2023 0-5  0 - 5 RBC/hpf Final   WBC, UA 08/02/2023 0-5  0 - 5 WBC/hpf Final   Bacteria, UA 08/02/2023 NONE SEEN  NONE SEEN Final    Squamous Epithelial / HPF 08/02/2023 0-5  0 - 5 /HPF Final   Mucus 08/02/2023 PRESENT   Final   Hyaline Casts, UA 08/02/2023 PRESENT   Final   Performed at [facility], [address], [place], [place] [address]   Opiates 08/02/2023 NONE DETECTED  NONE DETECTED Final   Cocaine 08/02/2023 NONE DETECTED  NONE DETECTED Final   Benzodiazepines 08/02/2023 POSITIVE (A)  NONE DETECTED Final   Amphetamines 08/02/2023 NONE DETECTED  NONE DETECTED Final   Tetrahydrocannabinol 08/02/2023 NONE DETECTED  NONE DETECTED Final   Barbiturates 08/02/2023 NONE DETECTED  NONE DETECTED Final   Comment: (NOTE) DRUG SCREEN FOR MEDICAL PURPOSES ONLY.  IF CONFIRMATION IS NEEDED FOR ANY PURPOSE, NOTIFY LAB WITHIN 5 DAYS.  LOWEST DETECTABLE LIMITS FOR URINE DRUG SCREEN Drug Class                     Cutoff (ng/mL) Amphetamine and metabolites    1000 Barbiturate and metabolites    200 Benzodiazepine                 200 Opiates and metabolites        300 Cocaine and metabolites        300 THC                            50 Performed at [facility], [address] [place], [place] [address]    Acetaminophen  (Tylenol ), Serum 08/02/2023 <10 (L)  10 - 30 ug/mL Final   Comment: (NOTE) Therapeutic concentrations vary significantly. A range of 10-30 ug/mL  may be an effective concentration for many patients. However, some  are best treated at concentrations outside of this range. Acetaminophen  concentrations >150 ug/mL at 4 hours after ingestion  and >50 ug/mL at 12 hours after ingestion are often associated with  toxic reactions.  Performed at [facility], [address], [place], [place] [address]    Salicylate Lvl 08/02/2023 <7.0 (L)  7.0 - 30.0 mg/dL Final   Performed at [facility], [address] [place], [place] [address]   Sodium 08/02/2023 144  135 - 145 mmol/L Final   Potassium 08/02/2023 3.8  3.5 - 5.1 mmol/L Final    Chloride 08/02/2023 107  98 - 111 mmol/L Final   CO2 08/02/2023 23  22 - 32 mmol/L Final   Glucose, Bld 08/02/2023 95  70 - 99 mg/dL Final   Glucose reference range applies only to samples taken after fasting for at least 8 hours.   BUN 08/02/2023 8  6 - 20 mg/dL Final   Creatinine, Ser 08/02/2023 0.76  0.61 - 1.24 mg/dL Final   Calcium 92/77/7974 9.2  8.9 - 10.3 mg/dL Final   Total Protein 92/77/7974 8.5 (  H)  6.5 - 8.1 g/dL Final   Albumin 92/77/7974 4.1  3.5 - 5.0 g/dL Final   AST 92/77/7974 55 (H)  15 - 41 U/L Final   ALT 08/02/2023 50 (H)  0 - 44 U/L Final   Alkaline Phosphatase 08/02/2023 65  38 - 126 U/L Final   Total Bilirubin 08/02/2023 0.7  0.0 - 1.2 mg/dL Final   GFR, Estimated 08/02/2023 >60  >60 mL/min Final   Comment: (NOTE) Calculated using the CKD-EPI Creatinine Equation (2021)    Anion gap 08/02/2023 14  5 - 15 Final   Performed at [facility], [address] [place], [place] [address]   Magnesium  08/02/2023 1.9  1.7 - 2.4 mg/dL Final   Performed at [facility], [address], [place], [place] [address]   Glucose-Capillary 08/02/2023 126 (H)  70 - 99 mg/dL Final   Glucose reference range applies only to samples taken after fasting for at least 8 hours.  Admission on 04/27/2023, Discharged on 04/30/2023  Component Date Value Ref Range Status   Free T4 04/27/2023 0.70  0.61 - 1.12 ng/dL Final   Comment: (NOTE) Biotin ingestion may interfere with free T4 tests. If the results are inconsistent with the TSH level, previous test results, or the clinical presentation, then consider biotin interference. If needed, order repeat testing after stopping biotin. Performed at [facility], [address], [place], [place] [address]    T3, Free 04/29/2023 2.5  2.0 - 4.4 pg/mL Final   Comment: (NOTE) Performed At: [facility] [address] [place], [place] [id number] Jennette Shorter MD Ey:[id number]    Total Protein  04/29/2023 7.3  6.5 - 8.1 g/dL Final   Albumin 95/81/7974 3.3 (L)  3.5 - 5.0 g/dL Final   AST 95/81/7974 94 (H)  15 - 41 U/L Final   ALT 04/29/2023 95 (H)  0 - 44 U/L Final   Alkaline Phosphatase 04/29/2023 55  38 - 126 U/L Final   Total Bilirubin 04/29/2023 0.8  0.0 - 1.2 mg/dL Final   Bilirubin, Direct 04/29/2023 0.2  0.0 - 0.2 mg/dL Final   Indirect Bilirubin 04/29/2023 0.6  0.3 - 0.9 mg/dL Final   Performed at [facility], [address], [place], [place] [address]   Neisseria Gonorrhea 04/29/2023 Negative   Final   Chlamydia 04/29/2023 Negative   Final   Comment 04/29/2023 Normal Reference Ranger Chlamydia - Negative   Final   Comment 04/29/2023 Normal Reference Range Neisseria Gonorrhea - Negative   Final   Color, Urine 04/30/2023 YELLOW  YELLOW Final   APPearance 04/30/2023 CLEAR  CLEAR Final   Specific Gravity, Urine 04/30/2023 1.023  1.005 - 1.030 Final   pH 04/30/2023 7.0  5.0 - 8.0 Final   Glucose, UA 04/30/2023 NEGATIVE  NEGATIVE mg/dL Final   Hgb urine dipstick 04/30/2023 NEGATIVE  NEGATIVE Final   Bilirubin Urine 04/30/2023 NEGATIVE  NEGATIVE Final   Ketones, ur 04/30/2023 NEGATIVE  NEGATIVE mg/dL Final   Protein, ur 95/80/7974 30 (A)  NEGATIVE mg/dL Final   Nitrite 95/80/7974 NEGATIVE  NEGATIVE Final   Leukocytes,Ua 04/30/2023 TRACE (A)  NEGATIVE Final   RBC / HPF 04/30/2023 0-5  0 - 5 RBC/hpf Final   WBC, UA 04/30/2023 11-20  0 - 5 WBC/hpf Final   Bacteria, UA 04/30/2023 NONE SEEN  NONE SEEN Final   Squamous Epithelial / HPF 04/30/2023 0-5  0 - 5 /HPF Final   Mucus 04/30/2023 PRESENT  Final   Triple Phosphate Crystal 04/30/2023 PRESENT   Final   Performed at [facility], [address] [place], [place] [address]   HIV Screen 4th Generation wRfx 04/29/2023 Non Reactive  Non Reactive Final   Performed at [facility], [address] [place], [place] [address]   Hepatitis B Surface Ag 04/29/2023 NON REACTIVE  NON REACTIVE Final   HCV Ab 04/29/2023  NON REACTIVE  NON REACTIVE Final   Comment: (NOTE) Nonreactive HCV antibody screen is consistent with no HCV infections,  unless recent infection is suspected or other evidence exists to indicate HCV infection.     Hep A IgM 04/29/2023 NON REACTIVE  NON REACTIVE Final   Hep B C IgM 04/29/2023 NON REACTIVE  NON REACTIVE Final   Performed at [facility], [address], [place], [place] [address]   RPR Ser Ql 04/29/2023 NON REACTIVE  NON REACTIVE Final   Performed at [facility], [address] [place], [place] [address]  Admission on 04/27/2023, Discharged on 04/27/2023  Component Date Value Ref Range Status   WBC 04/27/2023 4.5  4.0 - 10.5 K/uL Final   RBC 04/27/2023 4.98  4.22 - 5.81 MIL/uL Final   Hemoglobin 04/27/2023 15.7  13.0 - 17.0 g/dL Final   HCT 95/83/7974 45.5  39.0 - 52.0 % Final   MCV 04/27/2023 91.4  80.0 - 100.0 fL Final   MCH 04/27/2023 31.5  26.0 - 34.0 pg Final   MCHC 04/27/2023 34.5  30.0 - 36.0 g/dL Final   RDW 95/83/7974 12.6  11.5 - 15.5 % Final   Platelets 04/27/2023 137 (L)  150 - 400 K/uL Final   nRBC 04/27/2023 0.0  0.0 - 0.2 % Final   Neutrophils Relative % 04/27/2023 36  % Final   Neutro Abs 04/27/2023 1.6 (L)  1.7 - 7.7 K/uL Final   Lymphocytes Relative 04/27/2023 55  % Final   Lymphs Abs 04/27/2023 2.5  0.7 - 4.0 K/uL Final   Monocytes Relative 04/27/2023 7  % Final   Monocytes Absolute 04/27/2023 0.3  0.1 - 1.0 K/uL Final   Eosinophils Relative 04/27/2023 2  % Final   Eosinophils Absolute 04/27/2023 0.1  0.0 - 0.5 K/uL Final   Basophils Relative 04/27/2023 0  % Final   Basophils Absolute 04/27/2023 0.0  0.0 - 0.1 K/uL Final   Immature Granulocytes 04/27/2023 0  % Final   Abs Immature Granulocytes 04/27/2023 0.01  0.00 - 0.07 K/uL Final   Performed at [facility], [address], [place], [place] [address]   Sodium 04/27/2023 141  135 - 145 mmol/L Final   Potassium 04/27/2023 3.7  3.5 - 5.1 mmol/L Final   Chloride 04/27/2023  104  98 - 111 mmol/L Final   CO2 04/27/2023 20 (L)  22 - 32 mmol/L Final   Glucose, Bld 04/27/2023 102 (H)  70 - 99 mg/dL Final   Glucose reference range applies only to samples taken after fasting for at least 8 hours.   BUN 04/27/2023 6  6 - 20 mg/dL Final   Creatinine, Ser 04/27/2023 0.78  0.61 - 1.24 mg/dL Final   Calcium 95/83/7974 8.9  8.9 - 10.3 mg/dL Final   Total Protein 95/83/7974 7.8  6.5 - 8.1 g/dL Final   Albumin 95/83/7974 4.1  3.5 - 5.0 g/dL Final   AST 95/83/7974 195 (H)  15 - 41 U/L Final   ALT 04/27/2023 144 (H)  0 -  44 U/L Final   Alkaline Phosphatase 04/27/2023 60  38 - 126 U/L Final   Total Bilirubin 04/27/2023 0.4  0.0 - 1.2 mg/dL Final   GFR, Estimated 04/27/2023 >60  >60 mL/min Final   Comment: (NOTE) Calculated using the CKD-EPI Creatinine Equation (2021)    Anion gap 04/27/2023 17 (H)  5 - 15 Final   Performed at [facility], [address] [place], [place] [address]   Hgb A1c MFr Bld 04/27/2023 5.2  4.8 - 5.6 % Final   Comment: (NOTE) Pre diabetes:          5.7%-6.4%  Diabetes:              >6.4%  Glycemic control for   <7.0% adults with diabetes    Mean Plasma Glucose 04/27/2023 102.54  mg/dL Final   Performed at [facility], [address], [place], [place] [address]   Alcohol, Ethyl (B) 04/27/2023 285 (H)  <10 mg/dL Final   Comment: (NOTE) For medical purposes only. Performed at [facility], [address] [place], [place] [address]    Cholesterol 04/27/2023 200  0 - 200 mg/dL Final   Triglycerides 95/83/7974 93  <150 mg/dL Final   HDL 95/83/7974 113  >40 mg/dL Final   Total CHOL/HDL Ratio 04/27/2023 1.8  RATIO Final   VLDL 04/27/2023 19  0 - 40 mg/dL Final   LDL Cholesterol 04/27/2023 68  0 - 99 mg/dL Final   Comment:        Total Cholesterol/HDL:CHD Risk Coronary Heart Disease Risk Table                     Men   Women  1/2 Average Risk   3.4   3.3  Average Risk       5.0   4.4  2 X Average Risk   9.6   7.1  3 X  Average Risk  23.4   11.0        Use the calculated Patient Ratio above and the CHD Risk Table to determine the patient's CHD Risk.        ATP III CLASSIFICATION (LDL):  <100     mg/dL   Optimal  899-870  mg/dL   Near or Above                    Optimal  130-159  mg/dL   Borderline  839-810  mg/dL   High  >809     mg/dL   Very High Performed at [facility], [address] [place], [place] [address]    TSH 04/27/2023 0.202 (L)  0.350 - 4.500 uIU/mL Final   Comment: Performed by a 3rd Generation assay with a functional sensitivity of <=0.01 uIU/mL. Performed at [facility], [address], [place], [place] [address]    POC Amphetamine UR 04/27/2023 None Detected  NONE DETECTED (Cut Off Level 1000 ng/mL) Final   POC Secobarbital (BAR) 04/27/2023 None Detected  NONE DETECTED (Cut Off Level 300 ng/mL) Final   POC Buprenorphine (BUP) 04/27/2023 None Detected  NONE DETECTED (Cut Off Level 10 ng/mL) Final   POC Oxazepam (BZO) 04/27/2023 None Detected  NONE DETECTED (Cut Off Level 300 ng/mL) Final   POC Cocaine UR 04/27/2023 None Detected  NONE DETECTED (Cut Off Level 300 ng/mL) Final   POC Methamphetamine UR 04/27/2023 None Detected  NONE DETECTED (Cut Off Level 1000 ng/mL) Final  POC Morphine  04/27/2023 None Detected  NONE DETECTED (Cut Off Level 300 ng/mL) Final   POC Methadone UR 04/27/2023 None Detected  NONE DETECTED (Cut Off Level 300 ng/mL) Final   POC Oxycodone UR 04/27/2023 None Detected  NONE DETECTED (Cut Off Level 100 ng/mL) Final   POC Marijuana UR 04/27/2023 None Detected  NONE DETECTED (Cut Off Level 50 ng/mL) Final    Blood Alcohol level:  Lab Results  Component Value Date   ETH 129 (H) 08/02/2023   ETH 285 (H) 04/27/2023    Metabolic Disorder Labs: Lab Results  Component Value Date   HGBA1C 5.2 04/27/2023   MPG 102.54 04/27/2023   MPG 99.67 02/08/2022   No results found for: PROLACTIN Lab Results  Component Value Date   CHOL 155 08/04/2023    TRIG 93 08/04/2023   HDL 86 08/04/2023   CHOLHDL 1.8 08/04/2023   VLDL 19 08/04/2023   LDLCALC 50 08/04/2023   LDLCALC 68 04/27/2023    Therapeutic Lab Levels: No results found for: LITHIUM No results found for: VALPROATE No results found for: CBMZ  Physical Findings   AIMS    Flowsheet Row [facility]  AIMS Total Score 0   AUDIT    Flowsheet Row Ad[facility]  Alcohol Use Disorder Identification Test Final Score (AUDIT) 36   PHQ2-9    Flowsheet Row ED from 08/03/2023 in [facility] ED from 04/27/2023 in [facility] ED from 02/13/2022 in [facility]  PHQ-2 Total Score 6 0 2  PHQ-9 Total Score 23 -- 5   Flowsheet Row ED from 08/03/2023 in [facility] ED from 08/02/2023 in [facility] ED from 04/27/2023 in [facility]  C-SSRS RISK CATEGORY High Risk High Risk High Risk     Musculoskeletal  Strength & Muscle Tone: within normal limits Gait & Station: normal Patient leans: N/A  Psychiatric Specialty Exam  Presentation  General Appearance:  Appropriate for Environment  Eye Contact: Fair  Speech: Clear and Coherent  Speech Volume: Normal  Handedness: Right   Mood and Affect  Mood: Anxious  Affect: Congruent   Thought Process  Thought Processes: Coherent  Descriptions of Associations:Intact  Orientation:Full (Time, Place and Person)  Thought Content:Logical; Paranoid Ideation  Diagnosis of Schizophrenia or Schizoaffective disorder in past: No    Hallucinations:Hallucinations: None  Ideas of Reference:None  Suicidal Thoughts:Suicidal Thoughts: No  Homicidal Thoughts:Homicidal Thoughts: No   Sensorium   Memory: Immediate Fair; Recent Fair; Remote Fair  Judgment: Fair  Insight: Fair   Art therapist  Concentration: Fair  Attention Span: Fair  Recall: [employer] of Knowledge: Fair  Language: Fair   Psychomotor Activity  Psychomotor Activity: Psychomotor Activity: Normal   Assets  Assets: Communication Skills; Desire for Improvement; Leisure Time; Physical Health   Sleep  Sleep: Sleep: Poor  Estimated Sleeping Duration (Last 24 Hours): 6.00-7.00 hours  Nutritional Assessment (For [facility] and [facility] admissions only) Has the patient had a weight loss or gain of 10 pounds or more in the last 3 months?: No Has the patient had a decrease in food intake/or appetite?: Yes Does the patient have dental problems?: No Does the patient have eating habits or behaviors that may be indicators of an eating disorder including binging or inducing vomiting?: No Has the patient  recently lost weight without trying?: 0 Has the patient been eating poorly because of a decreased appetite?: 0 Malnutrition Screening Tool Score: 0    Physical Exam  Physical Exam Cardiovascular:     Rate and Rhythm: Normal rate.  Pulmonary:     Effort: Pulmonary effort is normal.  Musculoskeletal:        General: Normal range of motion.     Cervical back: Normal range of motion.  Neurological:     Mental Status: He is alert and oriented to person, place, and time.    Review of Systems  Constitutional:  Positive for chills.  Respiratory: Negative.    Cardiovascular: Negative.   Gastrointestinal:  Positive for nausea.  Neurological: Negative.    Blood pressure 125/87, pulse 71, temperature 98.2 F (36.8 C), temperature source Oral, resp. rate 18, SpO2 97%. There is no height or weight on file to calculate BMI.  Treatment Plan Summary: Medication regimen Start Seroquel  50 mg nightly for psychosis and mood stabilization. Continue Librium  25 mg taper for alcohol/benzo withdrawal, ends on  08/06/23   PRNS -Continue Agitation protocol medications:Zyprexa  PO/IM as per the MAR -Continue Hydroxyzine  25 mg TID PRN for anxiety -Continue Tylenol  650 mg every 6 hours PRN for mild pain -Continue Maalox 30 mg every 4 hrs PRN for indigestion -Continue Milk of Magnesia as needed every 6 hrs for constipation   Labs Reviewed: slightly elevated LFTs most likely related to alcohol consumption.   Discharge Planning: Social work and case management to assist with discharge planning and identification of hospital follow-up needs prior to discharge Estimated LOS: 5-7 days Discharge Concerns: Need to establish a safety plan; Medication compliance and effectiveness Discharge Goals: Return home with outpatient referrals for mental health follow-up including medication management/psychotherapy  Teresa Wyline CROME, NP 08/04/2023 12:10 PM

## 2023-08-04 NOTE — Group Note (Signed)
 Group Topic: Overcoming Obstacles  Group Date: 08/04/2023 Start Time: 0800 End Time: 0900 Facilitators: Lonzell Dwayne RAMAN, NT  Department: [facility]  Number of Participants: 7  Group Focus: goals/reality orientation Treatment Modality:  Patient-Centered Therapy Interventions utilized were mental fitness Purpose: enhance coping skills  Name: Thomas Vaughn Date of Birth: August 28, 1990  MR: [id number]    Level of Participation: Patient Attended groupmoderate Quality of Participation: attentive Interactions with others: gave feedback Mood/Affect: positive Triggers (if applicable): N/A Cognition: coherent/clear Progress: Moderate Response: Appropriate  Plan: patient will be encouraged to keep that positive attitude and continue to work on his sobriety.  Patients Problems:  Patient Active Problem List   Diagnosis Date Noted   Alcohol abuse 04/16/2017   Bipolar disorder, unspecified (HCC) 04/15/2017   ADHD (attention deficit hyperactivity disorder)    HYPOKALEMIA 07/29/2009   ANEMIA OF OTHER CHRONIC DISEASE 07/29/2009   GYNECOMASTIA 07/29/2009   CHEST PAIN UNSPECIFIED 07/29/2009

## 2023-08-04 NOTE — ED Notes (Signed)
 Provider notified of elevated bp. RN administered dose of librium  as instructed per provider.

## 2023-08-05 DIAGNOSIS — F1994 Other psychoactive substance use, unspecified with psychoactive substance-induced mood disorder: Secondary | ICD-10-CM | POA: Diagnosis not present

## 2023-08-05 DIAGNOSIS — F22 Delusional disorders: Secondary | ICD-10-CM | POA: Diagnosis not present

## 2023-08-05 DIAGNOSIS — F10129 Alcohol abuse with intoxication, unspecified: Secondary | ICD-10-CM | POA: Diagnosis not present

## 2023-08-05 LAB — T3, FREE: T3, Free: 3.8 pg/mL (ref 2.0–4.4)

## 2023-08-05 MED ORDER — QUETIAPINE FUMARATE 100 MG PO TABS
100.0000 mg | ORAL_TABLET | Freq: Every day | ORAL | Status: DC
  Administered 2023-08-05: 100 mg via ORAL
  Filled 2023-08-05: qty 1

## 2023-08-05 NOTE — ED Notes (Signed)
 Pt ate dinner, currently talking on the phone, no distress observed.

## 2023-08-05 NOTE — ED Notes (Signed)
 Patient is in the bedroom calm and sleeping. NAD. Will continue to monitor for safety.

## 2023-08-05 NOTE — ED Notes (Signed)
 Pt is sleeping . No acute distress noted.

## 2023-08-05 NOTE — Group Note (Signed)
 Group Topic: Other  Group Date: 08/05/2023 Start Time: 1500 End Time: 1600 Facilitators: [place] Stephane HERO, [place]  Department: [facility]  Number of Participants: 3  Group Focus: affirmation, clarity of thought, communication, concentration, coping skills, daily focus, and feeling awareness/expression Treatment Modality:  Cognitive Behavioral Therapy and Psychoeducation Interventions utilized were clarification, exploration, group exercise, mental fitness, patient education, and reality testing Purpose: enhance coping skills, explore maladaptive thinking, increase insight, regain self-worth, and reinforce self-care  Name: Thomas Vaughn Date of Birth: 02-Aug-1990  MR: [id number]    Level of Participation: n/a Quality of Participation: n/a Interactions with others: n/a Mood/Affect: n/a Triggers (if applicable): n/a Cognition: n/a Progress: Other Response: n/a Plan: follow-up needed  Patients Problems:  Patient Active Problem List   Diagnosis Date Noted   Alcohol abuse 04/16/2017   Bipolar disorder, unspecified (HCC) 04/15/2017   ADHD (attention deficit hyperactivity disorder)    HYPOKALEMIA 07/29/2009   ANEMIA OF OTHER CHRONIC DISEASE 07/29/2009   GYNECOMASTIA 07/29/2009   CHEST PAIN UNSPECIFIED 07/29/2009

## 2023-08-05 NOTE — ED Provider Notes (Signed)
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  Pulmonary:     Effort: Pulmonary effort is normal.  Musculoskeletal:        General: Normal range of motion.     Cervical back: Normal range of motion.  Neurological:     Mental Status: He is alert and oriented to person, place, and time.    Review of Systems  Constitutional:  Positive for chills.  Respiratory: Negative.    Cardiovascular: Negative.   Gastrointestinal:  Positive for nausea.  Neurological: Negative.    Blood pressure (!) 123/90, pulse 71, temperature 98.2 F (36.8 C), temperature source Oral, resp. rate 16, SpO2 97%. There is no height or weight on file to calculate BMI.  Treatment Plan Summary: Medication regimen Increase Seroquel  100 mg nightly for psychosis and mood stabilization. Continue Librium  25 mg taper for alcohol/benzo withdrawal, ends on 08/06/23   PRNS -Continue Agitation protocol medications:Zyprexa  PO/IM as per the [facility] -Continue Hydroxyzine  25 mg TID PRN for anxiety -Continue Tylenol  650 mg every 6 hours PRN for mild pain -Continue Maalox 30 mg every 4 hrs PRN for indigestion -Continue Milk of Magnesia as needed every 6 hrs for constipation   Labs Reviewed: slightly elevated LFTs most likely related to alcohol consumption.   Discharge Planning: Social work and case management to assist with discharge planning and identification of hospital follow-up needs prior to discharge Estimated LOS: 5-7 days Discharge Concerns: Need to establish a safety plan; Medication compliance and effectiveness Discharge Goals: Return home with outpatient referrals for mental health follow-up including medication management/psychotherapy  Teresa Wyline CROME, NP 08/05/2023 3:03 PM

## 2023-08-05 NOTE — ED Notes (Signed)
 Pt reports readiness for discharge, says he feels well rested and slept well. Pt denies si hi - verbal contract for safety provided. Pt ate breakfast, reports to have back pain but does nto want medication intervention. Pt observed to ambulate without difficulty.

## 2023-08-05 NOTE — Group Note (Signed)
 Group Topic: Communication  Group Date: 08/05/2023 Start Time: 0800 End Time: 0840 Facilitators: Nena Lesley PARAS, NT  Department: [facility]  Number of Participants: 3  Group Focus: check in, communication, community group, and daily focus Treatment Modality:  Psychoeducation Interventions utilized were clarification, exploration, patient education, and support Purpose: express feelings, improve communication skills, and increase insight  Name: Thomas Vaughn Date of Birth: Jun 14, 1990  MR: [id number]    Level of Participation: active Quality of Participation: engaged Interactions with others: gave feedback Mood/Affect: appropriate Triggers (if applicable): N/A Cognition: coherent/clear Progress: Gaining insight Response: During Community Group, Pt explained that he is not satisfied with the food here. He suggested that we serve warm/hot breakfast because he hates cereal and never eats oatmeal. Plan: patient will be encouraged to continue coming to groups  Patients Problems:  Patient Active Problem List   Diagnosis Date Noted   Alcohol abuse 04/16/2017   Bipolar disorder, unspecified (HCC) 04/15/2017   ADHD (attention deficit hyperactivity disorder)    HYPOKALEMIA 07/29/2009   ANEMIA OF OTHER CHRONIC DISEASE 07/29/2009   GYNECOMASTIA 07/29/2009   CHEST PAIN UNSPECIFIED 07/29/2009

## 2023-08-05 NOTE — Group Note (Signed)
 Group Topic: Wellness  Group Date: 08/05/2023 Start Time: 1730 End Time: 1745 Facilitators: Daved Tinnie HERO, RN  Department: [facility]  Number of Participants: 6  Group Focus: nursing group Treatment Modality:  Psychoeducation Interventions utilized were patient education Purpose: medication education  Name: Thomas Vaughn Date of Birth: September 28, 1990  MR: [id number]    Level of Participation: moderate Quality of Participation: cooperative Interactions with others: gave feedback Mood/Affect: appropriate Triggers (if applicable): n/a Cognition: logical Progress: Gaining insight Response: pt expressed understanding, further questions denied  Plan: patient will be encouraged to discuss medications with provider and RN  Patients Problems:  Patient Active Problem List   Diagnosis Date Noted   Alcohol abuse 04/16/2017   Bipolar disorder, unspecified (HCC) 04/15/2017   ADHD (attention deficit hyperactivity disorder)    HYPOKALEMIA 07/29/2009   ANEMIA OF OTHER CHRONIC DISEASE 07/29/2009   GYNECOMASTIA 07/29/2009   CHEST PAIN UNSPECIFIED 07/29/2009

## 2023-08-06 DIAGNOSIS — F10129 Alcohol abuse with intoxication, unspecified: Secondary | ICD-10-CM | POA: Diagnosis not present

## 2023-08-06 DIAGNOSIS — F22 Delusional disorders: Secondary | ICD-10-CM | POA: Diagnosis not present

## 2023-08-06 DIAGNOSIS — F1994 Other psychoactive substance use, unspecified with psychoactive substance-induced mood disorder: Secondary | ICD-10-CM | POA: Diagnosis not present

## 2023-08-06 MED ORDER — SERTRALINE HCL 50 MG PO TABS
50.0000 mg | ORAL_TABLET | Freq: Every day | ORAL | Status: DC
  Administered 2023-08-06 – 2023-08-08 (×3): 50 mg via ORAL
  Filled 2023-08-06 (×3): qty 1

## 2023-08-06 MED ORDER — OLANZAPINE 5 MG PO TABS
5.0000 mg | ORAL_TABLET | Freq: Every day | ORAL | Status: DC
  Administered 2023-08-06 – 2023-08-07 (×2): 5 mg via ORAL
  Filled 2023-08-06 (×2): qty 1

## 2023-08-06 NOTE — ED Notes (Signed)
 Pt was calm this evening, spent time watching TV prior to going to bed. He interacted appropriately when engaged, had no complaints, no withdrawal symptoms noted. He took his HS meds. Staff will continue to monitor pt for safety and implement current plan of care.

## 2023-08-06 NOTE — ED Notes (Signed)
 Patient is in the bedroom sleeping. NAD. Will monitor for safety

## 2023-08-06 NOTE — Group Note (Signed)
 Group Topic: Relaxation  Group Date: 08/06/2023 Start Time: 1330 End Time: 1400 Facilitators: Carletha Iha, RN  Department: [facility]  Number of Participants: 5  Group Focus: relaxation Treatment Modality:  Psychoeducation Interventions utilized were patient education Purpose: enhance coping skills  Name: Thomas Vaughn Date of Birth: 12/08/1990  MR: [id number]    Level of Participation: active Quality of Participation: cooperative Interactions with others: gave feedback Mood/Affect: appropriate Triggers (if applicable):  Cognition: goal directed Progress: Gaining insight Response:  Plan: patient will be encouraged to practice relaxation as coping skill   Patients Problems:  Patient Active Problem List   Diagnosis Date Noted   Alcohol abuse 04/16/2017   Bipolar disorder, unspecified (HCC) 04/15/2017   ADHD (attention deficit hyperactivity disorder)    HYPOKALEMIA 07/29/2009   ANEMIA OF OTHER CHRONIC DISEASE 07/29/2009   GYNECOMASTIA 07/29/2009   CHEST PAIN UNSPECIFIED 07/29/2009

## 2023-08-06 NOTE — ED Provider Notes (Addendum)
 Behavioral Health Progress Note  Date and Time: 08/06/2023 9:05 AM Name: Thomas Vaughn MRN:  [id number]  Subjective:  Thomas Vaughn is a 33 year old male patient with a past psychiatric history significant for alcohol use disorder, MDD and GAD who presented to the [facility] on 08/03/23 with complaints of suicidal thoughts with no plan or intent, alcohol abuse, drinking a pint of vodka daily and 4 locos and abusing Xanax  and Valium from off the street. BAL on arrival was 129. UDS was pos for benzodiazepines.   On evaluation, patient asked if he will be discharging today. I discussed with the patient that I spoke to his mother yesterday per his request and that his mother feels like he needs help. He states that he feels like he is taking medications and it's not working. He states that he only slept 4 hours last night and has been up since around 3 am this morning. He states that he does not know if it's stress related or nightmares. He states that when he was drinking alcohol he would go to sleep and wouldn't dream. He states that in the past he was given a tiny pill at [facility] that helped him. He states that he would like to be back on that medication. He states that he feels like his head be shaking all the time. He states that his mother has the same problem, head shaking. He states that he does not feel paranoid here in the hospital because of the cameras and security and feels safe. He denies AVH. He reports anxiety symptoms today of worrying and feeling like he can't breathe. He states that he counts numbers in head and likes to go outside to help decrease his anxiety. He denies depressive symptoms. However, PHQ-9 score today is 18 (moderate-severe depression). He reports improved appetite. He denies alcohol withdrawal. He denies suicidal thoughts. He denies homicidal thoughts.   Per chart review, patient was hospitalized at [facility] from 04/15/17-04/19/17 and  diagnosed with bipolar disorder and alcohol abuse. He was prescribed Zyprexa  5 mg at bedtime, Zoloft  50 mg daily  and Gabapentin  100 mg TID.   I discussed with the patient past medications. Patient states that he would like to take the olanzapine  for paranoia and Zoloft  for depression. I discussed with the patient the medication risk and benefits and he was given an opportunity to ask questions. Patient agreeable to discharge on Monday, 7/28. He is going to take time over the weekend to decide if he will be going to stay with his girlfriend or stay at this mother's house. His plan is to follow up with outpatient psychiatry and counseling for medication management, therapy and to maintain his sobriety.   Diagnosis:  Final diagnoses:  Paranoia (HCC)  Alcohol use disorder  Substance induced mood disorder (HCC)  Substance abuse (HCC)    Total Time spent with patient: 20 minutes  Past Psychiatric History: History of alcohol use disorder, MDD and GAD Past Medical History: No reported history Family History: Per chart review, family history of cardiac disease Family Psychiatric  History:  Per patient's mother, she has bipolar depression and PTSD and has to take medications. Social History: Patient resides with his girlfriend and girlfriend has 7 children that he helps to take care of. He has a children 8 of his own.  Current Medications:  Current Facility-Administered Medications  Medication Dose Route Frequency Provider Last Rate Last Admin   acetaminophen  (TYLENOL ) tablet 650 mg  650  mg Oral Q6H PRN Trudy Carwin, NP   650 mg at 08/04/23 0915   alum & mag hydroxide-simeth (MAALOX/MYLANTA) 200-200-20 MG/5ML suspension 30 mL  30 mL Oral Q4H PRN Trudy Carwin, NP       chlordiazePOXIDE  (LIBRIUM ) capsule 25 mg  25 mg Oral Q6H PRN Tex Drilling, NP   25 mg at 08/04/23 1329   chlordiazePOXIDE  (LIBRIUM ) capsule 25 mg  25 mg Oral Daily Nkwenti, Drilling, NP       magnesium  hydroxide (MILK OF MAGNESIA)  suspension 30 mL  30 mL Oral Daily PRN Trudy Carwin, NP       multivitamin with minerals tablet 1 tablet  1 tablet Oral Daily Trudy Carwin, NP   1 tablet at 08/06/23 9153   OLANZapine  (ZYPREXA ) injection 10 mg  10 mg Intramuscular TID PRN Trudy Carwin, NP       OLANZapine  (ZYPREXA ) injection 5 mg  5 mg Intramuscular TID PRN Trudy Carwin, NP       OLANZapine  (ZYPREXA ) tablet 5 mg  5 mg Oral QHS Mehul Rudin L, NP       OLANZapine  zydis (ZYPREXA ) disintegrating tablet 5 mg  5 mg Oral TID PRN Trudy Carwin, NP       sertraline  (ZOLOFT ) tablet 50 mg  50 mg Oral Daily Harm Jou L, NP       thiamine  (VITAMIN B1) injection 100 mg  100 mg Intramuscular Once Trudy Carwin, NP       Current Outpatient Medications  Medication Sig Dispense Refill   hydrOXYzine  (ATARAX ) 25 MG tablet Take 1 tablet (25 mg total) by mouth 3 (three) times daily as needed for anxiety. (Patient not taking: Reported on 08/02/2023) 30 tablet 0   ketoconazole  (NIZORAL ) 2 % cream Apply 1 Application topically daily. (Patient not taking: Reported on 08/02/2023) 15 g 0   nicotine  (NICODERM CQ  - DOSED IN MG/24 HOURS) 14 mg/24hr patch Place 1 patch (14 mg total) onto the skin daily. 28 patch 0   QUEtiapine  (SEROQUEL ) 25 MG tablet Take 1 tablet (25 mg total) by mouth at bedtime. (Patient not taking: Reported on 08/02/2023) 14 tablet 0   thiamine  (VITAMIN B-1) 100 MG tablet Take 1 tablet (100 mg total) by mouth daily. (Patient not taking: Reported on 08/02/2023) 30 tablet 0    Labs  Lab Results:  Admission on 08/03/2023  Component Date Value Ref Range Status   Cholesterol 08/04/2023 155  0 - 200 mg/dL Final   Triglycerides 92/75/7974 93  <150 mg/dL Final   HDL 92/75/7974 86  >40 mg/dL Final   Total CHOL/HDL Ratio 08/04/2023 1.8  RATIO Final   VLDL 08/04/2023 19  0 - 40 mg/dL Final   LDL Cholesterol 08/04/2023 50  0 - 99 mg/dL Final   Comment:        Total Cholesterol/HDL:CHD Risk Coronary Heart Disease Risk Table                      Men   Women  1/2 Average Risk   3.4   3.3  Average Risk       5.0   4.4  2 X Average Risk   9.6   7.1  3 X Average Risk  23.4   11.0        Use the calculated Patient Ratio above and the CHD Risk Table to determine the patient's CHD Risk.        ATP III CLASSIFICATION (LDL):  <100     mg/dL  Optimal  100-129  mg/dL   Near or Above                    Optimal  130-159  mg/dL   Borderline  839-810  mg/dL   High  >809     mg/dL   Very High Performed at [facility], [address], [place], [place] [address]    T3, Free 08/04/2023 3.8  2.0 - 4.4 pg/mL Final   Comment: (NOTE) Performed At: [facility] [address] [place], [place] [id number] Jennette Shorter MD Ey:[id number]    Vit D, 25-Hydroxy 08/04/2023 14.25 (L)  30 - 100 ng/mL Final   Comment: (NOTE) Vitamin D  deficiency has been defined by the Institute of Medicine  and an Endocrine Society practice guideline as a level of serum 25-OH  vitamin D  less than 20 ng/mL (1,2). The Endocrine Society went on to  further define vitamin D  insufficiency as a level between 21 and 29  ng/mL (2).  1. IOM (Institute of Medicine). 2010. Dietary reference intakes for  calcium and D. [place]: [employer]. 2. Holick MF, Binkley Gardners, Bischoff-Ferrari HA, et al. Evaluation,  treatment, and prevention of vitamin D  deficiency: an Endocrine  Society clinical practice guideline, JCEM. 2011 Jul; 96(7): 1911-30.  Performed at [facility], [address], [place], [place] [address]    Vitamin B-12 08/04/2023 254  180 - 914 pg/mL Final   Comment: (NOTE) This assay is not validated for testing neonatal or myeloproliferative syndrome specimens for Vitamin B12 levels. Performed at [facility], [address] [place], [place] [address]    Free T4 08/04/2023 0.77  0.61 - 1.12 ng/dL Final   Comment: (NOTE) Biotin ingestion may interfere with free T4 tests. If the results are inconsistent  with the TSH level, previous test results, or the clinical presentation, then consider biotin interference. If needed, order repeat testing after stopping biotin. Performed at [facility], [address] [place], [place] [address]    TSH 08/04/2023 2.934  0.350 - 4.500 uIU/mL Final   Comment: Performed by a 3rd Generation assay with a functional sensitivity of <=0.01 uIU/mL. Performed at [facility], [address] [place], [place] [address]   Admission on 08/02/2023, Discharged on 08/02/2023  Component Date Value Ref Range Status   Alcohol, Ethyl (B) 08/02/2023 129 (H)  <15 mg/dL Final   Comment: (NOTE) For medical purposes only. Performed at [facility], [address] [place], [place] [address]    WBC 08/02/2023 3.5 (L)  4.0 - 10.5 K/uL Final   RBC 08/02/2023 5.22  4.22 - 5.81 MIL/uL Final   Hemoglobin 08/02/2023 16.1  13.0 - 17.0 g/dL Final   HCT 92/77/7974 50.2  39.0 - 52.0 % Final   MCV 08/02/2023 96.2  80.0 - 100.0 fL Final   MCH 08/02/2023 30.8  26.0 - 34.0 pg Final   MCHC 08/02/2023 32.1  30.0 - 36.0 g/dL Final   RDW 92/77/7974 15.7 (H)  11.5 - 15.5 % Final   Platelets 08/02/2023 204  150 - 400 K/uL Final   nRBC 08/02/2023 0.0  0.0 - 0.2 % Final   Neutrophils Relative % 08/02/2023 22  % Final   Neutro Abs 08/02/2023 0.8 (L)  1.7 - 7.7 K/uL Final   Lymphocytes Relative 08/02/2023 63  % Final   Lymphs Abs 08/02/2023 2.2  0.7 - 4.0 K/uL Final   Monocytes  Relative 08/02/2023 11  % Final   Monocytes Absolute 08/02/2023 0.4  0.1 - 1.0 K/uL Final   Eosinophils Relative 08/02/2023 3  % Final   Eosinophils Absolute 08/02/2023 0.1  0.0 - 0.5 K/uL Final   Basophils Relative 08/02/2023 1  % Final   Basophils Absolute 08/02/2023 0.0  0.0 - 0.1 K/uL Final   RBC Morphology 08/02/2023 MORPHOLOGY UNREMARKABLE   Final   Smear Review 08/02/2023 MORPHOLOGY UNREMARKABLE   Final   Immature Granulocytes 08/02/2023 0  % Final   Abs Immature Granulocytes  08/02/2023 0.00  0.00 - 0.07 K/uL Final   Reactive, Benign Lymphocytes 08/02/2023 PRESENT   Final   Performed at [facility], [address] [place], [place] [address]   Color, Urine 08/02/2023 YELLOW  YELLOW Final   APPearance 08/02/2023 CLEAR  CLEAR Final   Specific Gravity, Urine 08/02/2023 1.021  1.005 - 1.030 Final   pH 08/02/2023 5.0  5.0 - 8.0 Final   Glucose, UA 08/02/2023 NEGATIVE  NEGATIVE mg/dL Final   Hgb urine dipstick 08/02/2023 NEGATIVE  NEGATIVE Final   Bilirubin Urine 08/02/2023 NEGATIVE  NEGATIVE Final   Ketones, ur 08/02/2023 5 (A)  NEGATIVE mg/dL Final   Protein, ur 92/77/7974 100 (A)  NEGATIVE mg/dL Final   Nitrite 92/77/7974 NEGATIVE  NEGATIVE Final   Leukocytes,Ua 08/02/2023 NEGATIVE  NEGATIVE Final   RBC / HPF 08/02/2023 0-5  0 - 5 RBC/hpf Final   WBC, UA 08/02/2023 0-5  0 - 5 WBC/hpf Final   Bacteria, UA 08/02/2023 NONE SEEN  NONE SEEN Final   Squamous Epithelial / HPF 08/02/2023 0-5  0 - 5 /HPF Final   Mucus 08/02/2023 PRESENT   Final   Hyaline Casts, UA 08/02/2023 PRESENT   Final   Performed at [facility], [address], [place], [place] [address]   Opiates 08/02/2023 NONE DETECTED  NONE DETECTED Final   Cocaine 08/02/2023 NONE DETECTED  NONE DETECTED Final   Benzodiazepines 08/02/2023 POSITIVE (A)  NONE DETECTED Final   Amphetamines 08/02/2023 NONE DETECTED  NONE DETECTED Final   Tetrahydrocannabinol 08/02/2023 NONE DETECTED  NONE DETECTED Final   Barbiturates 08/02/2023 NONE DETECTED  NONE DETECTED Final   Comment: (NOTE) DRUG SCREEN FOR MEDICAL PURPOSES ONLY.  IF CONFIRMATION IS NEEDED FOR ANY PURPOSE, NOTIFY LAB WITHIN 5 DAYS.  LOWEST DETECTABLE LIMITS FOR URINE DRUG SCREEN Drug Class                     Cutoff (ng/mL) Amphetamine and metabolites    1000 Barbiturate and metabolites    200 Benzodiazepine                 200 Opiates and metabolites        300 Cocaine and metabolites        300 THC                             50 Performed at [facility], [address] [place], [place] [address]    Acetaminophen  (Tylenol ), Serum 08/02/2023 <10 (L)  10 - 30 ug/mL Final   Comment: (NOTE) Therapeutic concentrations vary significantly. A range of 10-30 ug/mL  may be an effective concentration for many patients. However, some  are best treated at concentrations outside of this range. Acetaminophen  concentrations >150 ug/mL at 4 hours after ingestion  and >50 ug/mL at 12 hours after ingestion are often associated with  toxic reactions.  Performed at [facility], [address], [place], [place] [address]    Salicylate Lvl 08/02/2023 <7.0 (L)  7.0 - 30.0 mg/dL Final   Performed at [facility], [address], [place], [place] [address]   Sodium 08/02/2023 144  135 - 145 mmol/L Final   Potassium 08/02/2023 3.8  3.5 - 5.1 mmol/L Final   Chloride 08/02/2023 107  98 - 111 mmol/L Final   CO2 08/02/2023 23  22 - 32 mmol/L Final   Glucose, Bld 08/02/2023 95  70 - 99 mg/dL Final   Glucose reference range applies only to samples taken after fasting for at least 8 hours.   BUN 08/02/2023 8  6 - 20 mg/dL Final   Creatinine, Ser 08/02/2023 0.76  0.61 - 1.24 mg/dL Final   Calcium 92/77/7974 9.2  8.9 - 10.3 mg/dL Final   Total Protein 92/77/7974 8.5 (H)  6.5 - 8.1 g/dL Final   Albumin 92/77/7974 4.1  3.5 - 5.0 g/dL Final   AST 92/77/7974 55 (H)  15 - 41 U/L Final   ALT 08/02/2023 50 (H)  0 - 44 U/L Final   Alkaline Phosphatase 08/02/2023 65  38 - 126 U/L Final   Total Bilirubin 08/02/2023 0.7  0.0 - 1.2 mg/dL Final   GFR, Estimated 08/02/2023 >60  >60 mL/min Final   Comment: (NOTE) Calculated using the CKD-EPI Creatinine Equation (2021)    Anion gap 08/02/2023 14  5 - 15 Final   Performed at [facility], [address], [place], [place] [address]   Magnesium  08/02/2023 1.9  1.7 - 2.4 mg/dL Final   Performed at [facility], [address] [place], [place] [address]   Glucose-Capillary 08/02/2023 126 (H)  70 - 99 mg/dL Final   Glucose reference range applies only to samples taken after fasting for at least 8 hours.  Admission on 04/27/2023, Discharged on 04/30/2023  Component Date Value Ref Range Status   Free T4 04/27/2023 0.70  0.61 - 1.12 ng/dL Final   Comment: (NOTE) Biotin ingestion may interfere with free T4 tests. If the results are inconsistent with the TSH level, previous test results, or the clinical presentation, then consider biotin interference. If needed, order repeat testing after stopping biotin. Performed at [facility], [address] [place], [place] [address]    T3, Free 04/29/2023 2.5  2.0 - 4.4 pg/mL Final   Comment: (NOTE) Performed At: [facility] [address] [place], [place] [id number] Jennette Shorter MD Ey:[id number]    Total Protein 04/29/2023 7.3  6.5 - 8.1 g/dL Final   Albumin 95/81/7974 3.3 (L)  3.5 - 5.0 g/dL Final   AST 95/81/7974 94 (H)  15 - 41 U/L Final   ALT 04/29/2023 95 (H)  0 - 44 U/L Final   Alkaline Phosphatase 04/29/2023 55  38 - 126 U/L Final   Total Bilirubin 04/29/2023 0.8  0.0 - 1.2 mg/dL Final   Bilirubin, Direct 04/29/2023 0.2  0.0 - 0.2 mg/dL Final   Indirect Bilirubin 04/29/2023 0.6  0.3 - 0.9 mg/dL Final   Performed at [facility], [address] [place], [place] [address]   Neisseria Gonorrhea 04/29/2023 Negative   Final   Chlamydia 04/29/2023 Negative   Final   Comment 04/29/2023 Normal Reference Ranger Chlamydia - Negative   Final   Comment 04/29/2023 Normal Reference Range Neisseria Gonorrhea - Negative   Final   Color, Urine 04/30/2023  YELLOW  YELLOW Final   APPearance 04/30/2023 CLEAR  CLEAR Final   Specific Gravity, Urine 04/30/2023 1.023  1.005 - 1.030 Final   pH 04/30/2023 7.0  5.0 - 8.0 Final   Glucose, UA 04/30/2023 NEGATIVE  NEGATIVE mg/dL Final   Hgb urine dipstick 04/30/2023  NEGATIVE  NEGATIVE Final   Bilirubin Urine 04/30/2023 NEGATIVE  NEGATIVE Final   Ketones, ur 04/30/2023 NEGATIVE  NEGATIVE mg/dL Final   Protein, ur 95/80/7974 30 (A)  NEGATIVE mg/dL Final   Nitrite 95/80/7974 NEGATIVE  NEGATIVE Final   Leukocytes,Ua 04/30/2023 TRACE (A)  NEGATIVE Final   RBC / HPF 04/30/2023 0-5  0 - 5 RBC/hpf Final   WBC, UA 04/30/2023 11-20  0 - 5 WBC/hpf Final   Bacteria, UA 04/30/2023 NONE SEEN  NONE SEEN Final   Squamous Epithelial / HPF 04/30/2023 0-5  0 - 5 /HPF Final   Mucus 04/30/2023 PRESENT   Final   Triple Phosphate Crystal 04/30/2023 PRESENT   Final   Performed at [facility], [address], [place], [place] [address]   HIV Screen 4th Generation wRfx 04/29/2023 Non Reactive  Non Reactive Final   Performed at [facility], [address], [place], [place] [address]   Hepatitis B Surface Ag 04/29/2023 NON REACTIVE  NON REACTIVE Final   HCV Ab 04/29/2023 NON REACTIVE  NON REACTIVE Final   Comment: (NOTE) Nonreactive HCV antibody screen is consistent with no HCV infections,  unless recent infection is suspected or other evidence exists to indicate HCV infection.     Hep A IgM 04/29/2023 NON REACTIVE  NON REACTIVE Final   Hep B C IgM 04/29/2023 NON REACTIVE  NON REACTIVE Final   Performed at [facility], [address], [place], [place] [address]   RPR Ser Ql 04/29/2023 NON REACTIVE  NON REACTIVE Final   Performed at [facility], [address] [place], [place] [address]  Admission on 04/27/2023, Discharged on 04/27/2023  Component Date Value Ref Range Status   WBC 04/27/2023 4.5  4.0 - 10.5 K/uL Final   RBC 04/27/2023 4.98  4.22 - 5.81 MIL/uL Final   Hemoglobin 04/27/2023 15.7  13.0 - 17.0 g/dL Final   HCT 95/83/7974 45.5  39.0 - 52.0 % Final   MCV 04/27/2023 91.4  80.0 - 100.0 fL Final   MCH 04/27/2023 31.5  26.0 - 34.0 pg Final   MCHC 04/27/2023 34.5  30.0 - 36.0 g/dL Final   RDW 95/83/7974 12.6  11.5 - 15.5 % Final    Platelets 04/27/2023 137 (L)  150 - 400 K/uL Final   nRBC 04/27/2023 0.0  0.0 - 0.2 % Final   Neutrophils Relative % 04/27/2023 36  % Final   Neutro Abs 04/27/2023 1.6 (L)  1.7 - 7.7 K/uL Final   Lymphocytes Relative 04/27/2023 55  % Final   Lymphs Abs 04/27/2023 2.5  0.7 - 4.0 K/uL Final   Monocytes Relative 04/27/2023 7  % Final   Monocytes Absolute 04/27/2023 0.3  0.1 - 1.0 K/uL Final   Eosinophils Relative 04/27/2023 2  % Final   Eosinophils Absolute 04/27/2023 0.1  0.0 - 0.5 K/uL Final   Basophils Relative 04/27/2023 0  % Final   Basophils Absolute 04/27/2023 0.0  0.0 - 0.1 K/uL Final   Immature Granulocytes 04/27/2023 0  % Final   Abs Immature Granulocytes 04/27/2023 0.01  0.00 - 0.07 K/uL Final   Performed at [facility], [address], [place], [place] [address]   Sodium 04/27/2023 141  135 - 145 mmol/L Final   Potassium 04/27/2023 3.7  3.5 - 5.1 mmol/L Final   Chloride 04/27/2023 104  98 - 111 mmol/L Final   CO2 04/27/2023 20 (L)  22 - 32 mmol/L Final   Glucose, Bld 04/27/2023 102 (H)  70 - 99 mg/dL Final   Glucose reference range applies only to samples taken after fasting for at least 8 hours.   BUN 04/27/2023 6  6 - 20 mg/dL Final   Creatinine, Ser 04/27/2023 0.78  0.61 - 1.24 mg/dL Final   Calcium 95/83/7974 8.9  8.9 - 10.3 mg/dL Final   Total Protein 95/83/7974 7.8  6.5 - 8.1 g/dL Final   Albumin 95/83/7974 4.1  3.5 - 5.0 g/dL Final   AST 95/83/7974 195 (H)  15 - 41 U/L Final   ALT 04/27/2023 144 (H)  0 - 44 U/L Final   Alkaline Phosphatase 04/27/2023 60  38 - 126 U/L Final   Total Bilirubin 04/27/2023 0.4  0.0 - 1.2 mg/dL Final   GFR, Estimated 04/27/2023 >60  >60 mL/min Final   Comment: (NOTE) Calculated using the CKD-EPI Creatinine Equation (2021)    Anion gap 04/27/2023 17 (H)  5 - 15 Final   Performed at [facility], [address], [place], [place] [address]   Hgb A1c MFr Bld 04/27/2023 5.2  4.8 - 5.6 % Final   Comment: (NOTE) Pre diabetes:           5.7%-6.4%  Diabetes:              >6.4%  Glycemic control for   <7.0% adults with diabetes    Mean Plasma Glucose 04/27/2023 102.54  mg/dL Final   Performed at [facility], [address], [place], [place] [address]   Alcohol, Ethyl (B) 04/27/2023 285 (H)  <10 mg/dL Final   Comment: (NOTE) For medical purposes only. Performed at [facility], [address] [place], [place] [address]    Cholesterol 04/27/2023 200  0 - 200 mg/dL Final   Triglycerides 95/83/7974 93  <150 mg/dL Final   HDL 95/83/7974 113  >40 mg/dL Final   Total CHOL/HDL Ratio 04/27/2023 1.8  RATIO Final   VLDL 04/27/2023 19  0 - 40 mg/dL Final   LDL Cholesterol 04/27/2023 68  0 - 99 mg/dL Final   Comment:        Total Cholesterol/HDL:CHD Risk Coronary Heart Disease Risk Table                     Men   Women  1/2 Average Risk   3.4   3.3  Average Risk       5.0   4.4  2 X Average Risk   9.6   7.1  3 X Average Risk  23.4   11.0        Use the calculated Patient Ratio above and the CHD Risk Table to determine the patient's CHD Risk.        ATP III CLASSIFICATION (LDL):  <100     mg/dL   Optimal  899-870  mg/dL   Near or Above                    Optimal  130-159  mg/dL   Borderline  839-810  mg/dL   High  >809     mg/dL   Very High Performed at  [facility], [address], [place], [place] [address]    TSH 04/27/2023 0.202 (L)  0.350 - 4.500 uIU/mL Final   Comment: Performed by a 3rd Generation assay with a functional sensitivity of <=0.01 uIU/mL. Performed at [facility], [address] [place], [place] [address]    POC Amphetamine UR 04/27/2023 None Detected  NONE DETECTED (Cut Off Level 1000 ng/mL) Final   POC Secobarbital (BAR) 04/27/2023 None Detected  NONE DETECTED (Cut Off Level 300 ng/mL) Final   POC Buprenorphine (BUP) 04/27/2023 None Detected  NONE DETECTED (Cut Off Level 10 ng/mL) Final   POC Oxazepam (BZO) 04/27/2023 None Detected  NONE DETECTED (Cut Off Level  300 ng/mL) Final   POC Cocaine UR 04/27/2023 None Detected  NONE DETECTED (Cut Off Level 300 ng/mL) Final   POC Methamphetamine UR 04/27/2023 None Detected  NONE DETECTED (Cut Off Level 1000 ng/mL) Final   POC Morphine  04/27/2023 None Detected  NONE DETECTED (Cut Off Level 300 ng/mL) Final   POC Methadone UR 04/27/2023 None Detected  NONE DETECTED (Cut Off Level 300 ng/mL) Final   POC Oxycodone UR 04/27/2023 None Detected  NONE DETECTED (Cut Off Level 100 ng/mL) Final   POC Marijuana UR 04/27/2023 None Detected  NONE DETECTED (Cut Off Level 50 ng/mL) Final    Blood Alcohol level:  Lab Results  Component Value Date   ETH 129 (H) 08/02/2023   ETH 285 (H) 04/27/2023    Metabolic Disorder Labs: Lab Results  Component Value Date   HGBA1C 5.2 04/27/2023   MPG 102.54 04/27/2023   MPG 99.67 02/08/2022   No results found for: PROLACTIN Lab Results  Component Value Date   CHOL 155 08/04/2023   TRIG 93 08/04/2023   HDL 86 08/04/2023   CHOLHDL 1.8 08/04/2023   VLDL 19 08/04/2023   LDLCALC 50 08/04/2023   LDLCALC 68 04/27/2023    Therapeutic Lab Levels: No results found for: LITHIUM No results found for: VALPROATE No results found for: CBMZ  Physical Findings   AIMS    Flowsheet Row [facility]  AIMS Total Score 0   AUDIT    Flowsheet Row [facility]  Alcohol Use Disorder Identification Test Final Score (AUDIT) 36   PHQ2-9    Flowsheet Row ED from 08/03/2023 in [facility] ED from 04/27/2023 in [facility] ED from 02/13/2022 in [facility]  PHQ-2 Total Score 6 0 2  PHQ-9 Total Score 18 -- 5   Flowsheet Row ED from 08/03/2023 in [facility] ED from 08/02/2023 in [facility] ED from 04/27/2023 in [facility]  C-SSRS RISK CATEGORY High Risk High Risk High Risk     Musculoskeletal  Strength & Muscle Tone: within normal limits Gait & Station: normal Patient leans: N/A  Psychiatric Specialty Exam  Presentation  General Appearance:  Appropriate for Environment  Eye Contact: Fair  Speech: Clear and Coherent  Speech Volume: Normal  Handedness: Right   Mood and Affect  Mood: Anxious; Dysphoric  Affect: Congruent   Thought Process  Thought Processes: Coherent  Descriptions of Associations:Intact  Orientation:Full (Time, Place and Person)  Thought Content:Logical  Diagnosis of Schizophrenia or Schizoaffective disorder in past: No    Hallucinations:Hallucinations: None  Ideas of Reference:None  Suicidal Thoughts:Suicidal Thoughts:  No  Homicidal Thoughts:Homicidal Thoughts: No   Sensorium  Memory: Immediate Fair; Remote Fair; Recent Fair  Judgment: Fair  Insight: Fair   Chartered certified accountant: Fair  Attention Span: Fair  Recall: [employer] of Knowledge: Fair  Language: Fair   Psychomotor Activity  Psychomotor Activity: Psychomotor Activity: Normal   Assets  Assets: Communication Skills; Desire for Improvement   Sleep  Sleep: Sleep: Poor  Physical Exam  Physical Exam Cardiovascular:     Rate and Rhythm: Normal rate.  Pulmonary:     Effort: Pulmonary effort is normal.  Musculoskeletal:        General: Normal range of motion.     Cervical back: Normal range of motion.  Neurological:     Mental Status: He is alert and oriented to person, place, and time.    Review of Systems  Constitutional:  Positive for chills.  Respiratory: Negative.    Cardiovascular: Negative.   Gastrointestinal:  Positive for nausea.  Neurological: Negative.    Blood pressure 118/84, pulse 63, temperature 98.7 F (37.1 C), temperature source Oral, resp. rate 18,  SpO2 100%. There is no height or weight on file to calculate BMI.  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   Discharge Planning: Social work and case management to assist with discharge planning and identification of hospital follow-up needs prior to discharge Estimated LOS: 5-7 days Discharge Concerns: Anticipated discharge date Monday, August 08, 2023. Need to establish a safety plan; Medication compliance and effectiveness Discharge Goals: Return home with outpatient referrals for mental health follow-up including medication management/psychotherapy  Teresa Wyline CROME, NP 08/06/2023 9:05 AM

## 2023-08-06 NOTE — ED Notes (Signed)
 Pt observable in milieu interacting with peers.  Elevated mood noted. Pt has asked for discharge today, he spoke with provider and agreed to discharge on Monday to plan for follow.   Did not endorse SI, plan or intent, Q 15 minute observations for safety continue

## 2023-08-06 NOTE — ED Notes (Signed)
 Patient asleep at this time, no CIWA assessment done.

## 2023-08-06 NOTE — ED Notes (Addendum)
 Pt remains cooperative this shift.   No observations or reports of withdrawal symptoms noted.  Pt is on Librium  taper and is being monitored bu CIWA protocol.  CIWA scores have been 0 Q15 minute observations for safety continue

## 2023-08-06 NOTE — ED Notes (Signed)
 Pt presents sitting in day room watching TV.  Calm at present Q 15 minute observations for safety continue

## 2023-08-07 DIAGNOSIS — F1994 Other psychoactive substance use, unspecified with psychoactive substance-induced mood disorder: Secondary | ICD-10-CM | POA: Diagnosis not present

## 2023-08-07 DIAGNOSIS — F22 Delusional disorders: Secondary | ICD-10-CM | POA: Diagnosis not present

## 2023-08-07 DIAGNOSIS — F10129 Alcohol abuse with intoxication, unspecified: Secondary | ICD-10-CM | POA: Diagnosis not present

## 2023-08-07 NOTE — ED Notes (Signed)
 Pt awake and alert.  Denies SI, HI or AVH.  Bright affect and congruent mood.   Pt answers questions appropriately and is  maintaining appropriate boundaries.   Ate breakfast this morning and took medications after multiple requests for peanut butter for his oatmeal.     No distress noted. Q checks for safety in place.

## 2023-08-07 NOTE — ED Provider Notes (Signed)
 Behavioral Health Progress Note  Date and Time: 08/07/2023 9:03 AM Name: Thomas Vaughn MRN:  [id number]  Subjective:  Thomas Vaughn is a 33 year old male patient with a past psychiatric history significant for alcohol use disorder, MDD and GAD who presented to the [facility] on 08/03/23 with complaints of suicidal thoughts with no plan or intent, alcohol abuse, drinking a pint of vodka daily and 4 locos and abusing Xanax  and Valium from off the street. BAL on arrival was 129. UDS was pos for benzodiazepines.   On evaluation, patient is lying down in bed in no acute distress. He is alert and oriented x 4. He reports improved sleep and decreased paranoia with starting the olanzapine  last night. He states that last night was the first night he was able to sleep the whole night. He denies AVH. Objectively, no signs of acute psychosis and he does not appear to be responding to internal or external stimuli. He denies depressive or anxiety symptoms today. However, PHQ-9 score 18 (moderately severe depression) on 7/26. He denies suicidal thoughts. He denies homicidal thoughts. He reports a good appetite. He denies alcohol/benzo withdrawal symptoms or cravings. He denies physical pain. He denies medication side effects to starting olanzapine  and Zoloft . He verbalizes readiness to discharge home tomorrow, on Monday 7/28. He plans to stay with his mother for a little while. He will follow up with outpatient psychiatry for medication management and counseling. Will provide patient information on local AA meetings to maintain sobriety.   Diagnosis:  Final diagnoses:  Paranoia (HCC)  Alcohol use disorder  Substance induced mood disorder (HCC)  Substance abuse (HCC)    Total Time spent with patient: 20 minutes  Past Psychiatric History: History of alcohol use disorder, MDD and GAD Past Medical History: No reported history. Family History: Per chart review, family  history of cardiac disease Family Psychiatric  History:  Per patient's mother, she has bipolar depression and PTSD and has to take medications. Social History: Patient resides with his girlfriend and girlfriend has 7 children that he helps to take care of. He has a children 8 of his own.  Current Medications:  Current Facility-Administered Medications  Medication Dose Route Frequency Provider Last Rate Last Admin   acetaminophen  (TYLENOL ) tablet 650 mg  650 mg Oral Q6H PRN Trudy Carwin, NP   650 mg at 08/04/23 0915   alum & mag hydroxide-simeth (MAALOX/MYLANTA) 200-200-20 MG/5ML suspension 30 mL  30 mL Oral Q4H PRN Trudy Carwin, NP       magnesium  hydroxide (MILK OF MAGNESIA) suspension 30 mL  30 mL Oral Daily PRN Trudy Carwin, NP       multivitamin with minerals tablet 1 tablet  1 tablet Oral Daily Trudy Carwin, NP   1 tablet at 08/06/23 9153   OLANZapine  (ZYPREXA ) injection 10 mg  10 mg Intramuscular TID PRN Trudy Carwin, NP       OLANZapine  (ZYPREXA ) injection 5 mg  5 mg Intramuscular TID PRN Trudy Carwin, NP       OLANZapine  (ZYPREXA ) tablet 5 mg  5 mg Oral QHS Arella Blinder L, NP   5 mg at 08/06/23 2202   OLANZapine  zydis (ZYPREXA ) disintegrating tablet 5 mg  5 mg Oral TID PRN Trudy Carwin, NP       sertraline  (ZOLOFT ) tablet 50 mg  50 mg Oral Daily Sanders Manninen L, NP   50 mg at 08/06/23 0930   thiamine  (VITAMIN B1) injection 100 mg  100 mg Intramuscular  Once Trudy Carwin, NP       Current Outpatient Medications  Medication Sig Dispense Refill   hydrOXYzine  (ATARAX ) 25 MG tablet Take 1 tablet (25 mg total) by mouth 3 (three) times daily as needed for anxiety. (Patient not taking: Reported on 08/02/2023) 30 tablet 0   ketoconazole  (NIZORAL ) 2 % cream Apply 1 Application topically daily. (Patient not taking: Reported on 08/02/2023) 15 g 0   nicotine  (NICODERM CQ  - DOSED IN MG/24 HOURS) 14 mg/24hr patch Place 1 patch (14 mg total) onto the skin daily. 28 patch 0   QUEtiapine   (SEROQUEL ) 25 MG tablet Take 1 tablet (25 mg total) by mouth at bedtime. (Patient not taking: Reported on 08/02/2023) 14 tablet 0   thiamine  (VITAMIN B-1) 100 MG tablet Take 1 tablet (100 mg total) by mouth daily. (Patient not taking: Reported on 08/02/2023) 30 tablet 0    Labs  Lab Results:  Admission on 08/03/2023  Component Date Value Ref Range Status   Cholesterol 08/04/2023 155  0 - 200 mg/dL Final   Triglycerides 92/75/7974 93  <150 mg/dL Final   HDL 92/75/7974 86  >40 mg/dL Final   Total CHOL/HDL Ratio 08/04/2023 1.8  RATIO Final   VLDL 08/04/2023 19  0 - 40 mg/dL Final   LDL Cholesterol 08/04/2023 50  0 - 99 mg/dL Final   Comment:        Total Cholesterol/HDL:CHD Risk Coronary Heart Disease Risk Table                     Men   Women  1/2 Average Risk   3.4   3.3  Average Risk       5.0   4.4  2 X Average Risk   9.6   7.1  3 X Average Risk  23.4   11.0        Use the calculated Patient Ratio above and the CHD Risk Table to determine the patient's CHD Risk.        ATP III CLASSIFICATION (LDL):  <100     mg/dL   Optimal  899-870  mg/dL   Near or Above                    Optimal  130-159  mg/dL   Borderline  839-810  mg/dL   High  >809     mg/dL   Very High Performed at [facility], [address], [place], [place] [address]    T3, Free 08/04/2023 3.8  2.0 - 4.4 pg/mL Final   Comment: (NOTE) Performed At: [facility] [address] [place], [place] [id number] Jennette Shorter MD Ey:[id number]    Vit D, 25-Hydroxy 08/04/2023 14.25 (L)  30 - 100 ng/mL Final   Comment: (NOTE) Vitamin D  deficiency has been defined by the Institute of Medicine  and an Endocrine Society practice guideline as a level of serum 25-OH  vitamin D  less than 20 ng/mL (1,2). The Endocrine Society went on to  further define vitamin D  insufficiency as a level between 21 and 29  ng/mL (2).  1. IOM (Institute of Medicine). 2010. Dietary reference intakes for  calcium and D. [place]: [employer]. 2. Holick MF, [place], Bischoff-Ferrari HA, et al. Evaluation,  treatment, and prevention of vitamin D  deficiency: an Endocrine  Society clinical practice guideline, JCEM. 2011 Jul; 96(7): 1911-30.  Performed at [facility], [address] [place], [place] [address]  Vitamin B-12 08/04/2023 254  180 - 914 pg/mL Final   Comment: (NOTE) This assay is not validated for testing neonatal or myeloproliferative syndrome specimens for Vitamin B12 levels. Performed at [facility], [address], [place], [place] [address]    Free T4 08/04/2023 0.77  0.61 - 1.12 ng/dL Final   Comment: (NOTE) Biotin ingestion may interfere with free T4 tests. If the results are inconsistent with the TSH level, previous test results, or the clinical presentation, then consider biotin interference. If needed, order repeat testing after stopping biotin. Performed at [facility], [address] [place], [place] [address]    TSH 08/04/2023 2.934  0.350 - 4.500 uIU/mL Final   Comment: Performed by a 3rd Generation assay with a functional sensitivity of <=0.01 uIU/mL. Performed at [facility], [address], [place], [place] [address]   Admission on 08/02/2023, Discharged on 08/02/2023  Component Date Value Ref Range Status   Alcohol, Ethyl (B) 08/02/2023 129 (H)  <15 mg/dL Final   Comment: (NOTE) For medical purposes only. Performed at [facility], [address] [place], [place] [address]    WBC 08/02/2023 3.5 (L)  4.0 - 10.5 K/uL Final   RBC 08/02/2023 5.22  4.22 - 5.81 MIL/uL Final   Hemoglobin 08/02/2023 16.1  13.0 - 17.0 g/dL Final   HCT 92/77/7974 50.2  39.0 - 52.0 % Final   MCV 08/02/2023 96.2  80.0 - 100.0 fL Final   MCH 08/02/2023 30.8  26.0 - 34.0 pg Final   MCHC 08/02/2023 32.1  30.0 - 36.0 g/dL Final   RDW 92/77/7974 15.7 (H)  11.5 - 15.5 % Final   Platelets 08/02/2023 204  150 - 400 K/uL Final   nRBC  08/02/2023 0.0  0.0 - 0.2 % Final   Neutrophils Relative % 08/02/2023 22  % Final   Neutro Abs 08/02/2023 0.8 (L)  1.7 - 7.7 K/uL Final   Lymphocytes Relative 08/02/2023 63  % Final   Lymphs Abs 08/02/2023 2.2  0.7 - 4.0 K/uL Final   Monocytes Relative 08/02/2023 11  % Final   Monocytes Absolute 08/02/2023 0.4  0.1 - 1.0 K/uL Final   Eosinophils Relative 08/02/2023 3  % Final   Eosinophils Absolute 08/02/2023 0.1  0.0 - 0.5 K/uL Final   Basophils Relative 08/02/2023 1  % Final   Basophils Absolute 08/02/2023 0.0  0.0 - 0.1 K/uL Final   RBC Morphology 08/02/2023 MORPHOLOGY UNREMARKABLE   Final   Smear Review 08/02/2023 MORPHOLOGY UNREMARKABLE   Final   Immature Granulocytes 08/02/2023 0  % Final   Abs Immature Granulocytes 08/02/2023 0.00  0.00 - 0.07 K/uL Final   Reactive, Benign Lymphocytes 08/02/2023 PRESENT   Final   Performed at [facility], [address], [place], [place] [address]   Color, Urine 08/02/2023 YELLOW  YELLOW Final   APPearance 08/02/2023 CLEAR  CLEAR Final   Specific Gravity, Urine 08/02/2023 1.021  1.005 - 1.030 Final   pH 08/02/2023 5.0  5.0 - 8.0 Final   Glucose, UA 08/02/2023 NEGATIVE  NEGATIVE mg/dL Final   Hgb urine dipstick 08/02/2023 NEGATIVE  NEGATIVE Final   Bilirubin Urine 08/02/2023 NEGATIVE  NEGATIVE Final   Ketones, ur 08/02/2023 5 (A)  NEGATIVE mg/dL Final   Protein, ur 92/77/7974 100 (A)  NEGATIVE mg/dL Final   Nitrite 92/77/7974 NEGATIVE  NEGATIVE Final   Leukocytes,Ua 08/02/2023 NEGATIVE  NEGATIVE Final   RBC / HPF 08/02/2023 0-5  0 - 5 RBC/hpf Final   WBC, UA 08/02/2023 0-5  0 - 5 WBC/hpf Final   Bacteria, UA 08/02/2023 NONE SEEN  NONE SEEN Final   Squamous Epithelial / HPF 08/02/2023 0-5  0 - 5 /HPF Final   Mucus 08/02/2023 PRESENT   Final   Hyaline Casts, UA 08/02/2023 PRESENT   Final   Performed at [facility], [address] [place], [place] [address]   Opiates 08/02/2023 NONE DETECTED  NONE  DETECTED Final   Cocaine 08/02/2023 NONE DETECTED  NONE DETECTED Final   Benzodiazepines 08/02/2023 POSITIVE (A)  NONE DETECTED Final   Amphetamines 08/02/2023 NONE DETECTED  NONE DETECTED Final   Tetrahydrocannabinol 08/02/2023 NONE DETECTED  NONE DETECTED Final   Barbiturates 08/02/2023 NONE DETECTED  NONE DETECTED Final   Comment: (NOTE) DRUG SCREEN FOR MEDICAL PURPOSES ONLY.  IF CONFIRMATION IS NEEDED FOR ANY PURPOSE, NOTIFY LAB WITHIN 5 DAYS.  LOWEST DETECTABLE LIMITS FOR URINE DRUG SCREEN Drug Class                     Cutoff (ng/mL) Amphetamine and metabolites    1000 Barbiturate and metabolites    200 Benzodiazepine                 200 Opiates and metabolites        300 Cocaine and metabolites        300 THC                            50 Performed at [facility], [address], [place], [place] [address]    Acetaminophen  (Tylenol ), Serum 08/02/2023 <10 (L)  10 - 30 ug/mL Final   Comment: (NOTE) Therapeutic concentrations vary significantly. A range of 10-30 ug/mL  may be an effective concentration for many patients. However, some  are best treated at concentrations outside of this range. Acetaminophen  concentrations >150 ug/mL at 4 hours after ingestion  and >50 ug/mL at 12 hours after ingestion are often associated with  toxic reactions.  Performed at [facility], [address] [place], [place] [address]    Salicylate Lvl 08/02/2023 <7.0 (L)  7.0 - 30.0 mg/dL Final   Performed at [facility], [address], [place], [place] [address]   Sodium 08/02/2023 144  135 - 145 mmol/L Final   Potassium 08/02/2023 3.8  3.5 - 5.1 mmol/L Final   Chloride 08/02/2023 107  98 - 111 mmol/L Final   CO2 08/02/2023 23  22 - 32 mmol/L Final   Glucose, Bld 08/02/2023 95  70 - 99 mg/dL Final   Glucose reference range applies only to samples taken after fasting for at least 8 hours.   BUN 08/02/2023 8  6 - 20 mg/dL Final    Creatinine, Ser 08/02/2023 0.76  0.61 - 1.24 mg/dL Final   Calcium 92/77/7974 9.2  8.9 - 10.3 mg/dL Final   Total Protein 92/77/7974 8.5 (H)  6.5 - 8.1 g/dL Final   Albumin 92/77/7974 4.1  3.5 - 5.0 g/dL Final   AST 92/77/7974 55 (H)  15 - 41 U/L Final   ALT 08/02/2023 50 (H)  0 - 44 U/L Final   Alkaline Phosphatase 08/02/2023 65  38 - 126 U/L Final   Total Bilirubin 08/02/2023 0.7  0.0 - 1.2 mg/dL Final   GFR, Estimated 08/02/2023 >60  >60 mL/min Final   Comment: (NOTE) Calculated using  the CKD-EPI Creatinine Equation (2021)    Anion gap 08/02/2023 14  5 - 15 Final   Performed at [facility], [address], [place], [place] [address]   Magnesium  08/02/2023 1.9  1.7 - 2.4 mg/dL Final   Performed at [facility], [address] [place], [place] [address]   Glucose-Capillary 08/02/2023 126 (H)  70 - 99 mg/dL Final   Glucose reference range applies only to samples taken after fasting for at least 8 hours.  Admission on 04/27/2023, Discharged on 04/30/2023  Component Date Value Ref Range Status   Free T4 04/27/2023 0.70  0.61 - 1.12 ng/dL Final   Comment: (NOTE) Biotin ingestion may interfere with free T4 tests. If the results are inconsistent with the TSH level, previous test results, or the clinical presentation, then consider biotin interference. If needed, order repeat testing after stopping biotin. Performed at [facility], [address], [place], [place] [address]    T3, Free 04/29/2023 2.5  2.0 - 4.4 pg/mL Final   Comment: (NOTE) Performed At: [facility] [address] [place], [place] [id number] Jennette Shorter MD Ey:[id number]    Total Protein 04/29/2023 7.3  6.5 - 8.1 g/dL Final   Albumin 95/81/7974 3.3 (L)  3.5 - 5.0 g/dL Final   AST 95/81/7974 94 (H)  15 - 41 U/L Final   ALT 04/29/2023 95 (H)  0 - 44 U/L Final   Alkaline Phosphatase 04/29/2023 55  38 - 126 U/L Final   Total Bilirubin 04/29/2023 0.8  0.0 - 1.2  mg/dL Final   Bilirubin, Direct 04/29/2023 0.2  0.0 - 0.2 mg/dL Final   Indirect Bilirubin 04/29/2023 0.6  0.3 - 0.9 mg/dL Final   Performed at [facility], [address] [place], [place] [address]   Neisseria Gonorrhea 04/29/2023 Negative   Final   Chlamydia 04/29/2023 Negative   Final   Comment 04/29/2023 Normal Reference Ranger Chlamydia - Negative   Final   Comment 04/29/2023 Normal Reference Range Neisseria Gonorrhea - Negative   Final   Color, Urine 04/30/2023 YELLOW  YELLOW Final   APPearance 04/30/2023 CLEAR  CLEAR Final   Specific Gravity, Urine 04/30/2023 1.023  1.005 - 1.030 Final   pH 04/30/2023 7.0  5.0 - 8.0 Final   Glucose, UA 04/30/2023 NEGATIVE  NEGATIVE mg/dL Final   Hgb urine dipstick 04/30/2023 NEGATIVE  NEGATIVE Final   Bilirubin Urine 04/30/2023 NEGATIVE  NEGATIVE Final   Ketones, ur 04/30/2023 NEGATIVE  NEGATIVE mg/dL Final   Protein, ur 95/80/7974 30 (A)  NEGATIVE mg/dL Final   Nitrite 95/80/7974 NEGATIVE  NEGATIVE Final   Leukocytes,Ua 04/30/2023 TRACE (A)  NEGATIVE Final   RBC / HPF 04/30/2023 0-5  0 - 5 RBC/hpf Final   WBC, UA 04/30/2023 11-20  0 - 5 WBC/hpf Final   Bacteria, UA 04/30/2023 NONE SEEN  NONE SEEN Final   Squamous Epithelial / HPF 04/30/2023 0-5  0 - 5 /HPF Final   Mucus 04/30/2023 PRESENT   Final   Triple Phosphate Crystal 04/30/2023 PRESENT   Final   Performed at [facility], [address], [place], [place] [address]   HIV Screen 4th Generation wRfx 04/29/2023 Non Reactive  Non Reactive Final   Performed at [facility], [address] [place], [place] [address]   Hepatitis B Surface Ag 04/29/2023 NON REACTIVE  NON REACTIVE Final   HCV Ab 04/29/2023 NON REACTIVE  NON REACTIVE Final   Comment: (  NOTE) Nonreactive HCV antibody screen is consistent with no HCV infections,  unless recent infection is suspected or other evidence exists to indicate HCV infection.     Hep A IgM 04/29/2023 NON REACTIVE  NON REACTIVE Final    Hep B C IgM 04/29/2023 NON REACTIVE  NON REACTIVE Final   Performed at [facility], [address], [place], [place] [address]   RPR Ser Ql 04/29/2023 NON REACTIVE  NON REACTIVE Final   Performed at [facility], [address], [place], [place] [address]  Admission on 04/27/2023, Discharged on 04/27/2023  Component Date Value Ref Range Status   WBC 04/27/2023 4.5  4.0 - 10.5 K/uL Final   RBC 04/27/2023 4.98  4.22 - 5.81 MIL/uL Final   Hemoglobin 04/27/2023 15.7  13.0 - 17.0 g/dL Final   HCT 95/83/7974 45.5  39.0 - 52.0 % Final   MCV 04/27/2023 91.4  80.0 - 100.0 fL Final   MCH 04/27/2023 31.5  26.0 - 34.0 pg Final   MCHC 04/27/2023 34.5  30.0 - 36.0 g/dL Final   RDW 95/83/7974 12.6  11.5 - 15.5 % Final   Platelets 04/27/2023 137 (L)  150 - 400 K/uL Final   nRBC 04/27/2023 0.0  0.0 - 0.2 % Final   Neutrophils Relative % 04/27/2023 36  % Final   Neutro Abs 04/27/2023 1.6 (L)  1.7 - 7.7 K/uL Final   Lymphocytes Relative 04/27/2023 55  % Final   Lymphs Abs 04/27/2023 2.5  0.7 - 4.0 K/uL Final   Monocytes Relative 04/27/2023 7  % Final   Monocytes Absolute 04/27/2023 0.3  0.1 - 1.0 K/uL Final   Eosinophils Relative 04/27/2023 2  % Final   Eosinophils Absolute 04/27/2023 0.1  0.0 - 0.5 K/uL Final   Basophils Relative 04/27/2023 0  % Final   Basophils Absolute 04/27/2023 0.0  0.0 - 0.1 K/uL Final   Immature Granulocytes 04/27/2023 0  % Final   Abs Immature Granulocytes 04/27/2023 0.01  0.00 - 0.07 K/uL Final   Performed at [facility], [address] [place], [place] [address]   Sodium 04/27/2023 141  135 - 145 mmol/L Final   Potassium 04/27/2023 3.7  3.5 - 5.1 mmol/L Final   Chloride 04/27/2023 104  98 - 111 mmol/L Final   CO2 04/27/2023 20 (L)  22 - 32 mmol/L Final   Glucose, Bld 04/27/2023 102 (H)  70 - 99 mg/dL Final   Glucose reference range applies only to samples taken after fasting for at least 8 hours.   BUN 04/27/2023 6  6 - 20 mg/dL Final   Creatinine,  Ser 04/27/2023 0.78  0.61 - 1.24 mg/dL Final   Calcium 95/83/7974 8.9  8.9 - 10.3 mg/dL Final   Total Protein 95/83/7974 7.8  6.5 - 8.1 g/dL Final   Albumin 95/83/7974 4.1  3.5 - 5.0 g/dL Final   AST 95/83/7974 195 (H)  15 - 41 U/L Final   ALT 04/27/2023 144 (H)  0 - 44 U/L Final   Alkaline Phosphatase 04/27/2023 60  38 - 126 U/L Final   Total Bilirubin 04/27/2023 0.4  0.0 - 1.2 mg/dL Final   GFR, Estimated 04/27/2023 >60  >60 mL/min Final   Comment: (NOTE) Calculated using the CKD-EPI Creatinine Equation (2021)    Anion gap 04/27/2023 17 (H)  5 - 15 Final   Performed at [facility], [address], [place], [place] [address]   Hgb A1c MFr Bld  04/27/2023 5.2  4.8 - 5.6 % Final   Comment: (NOTE) Pre diabetes:          5.7%-6.4%  Diabetes:              >6.4%  Glycemic control for   <7.0% adults with diabetes    Mean Plasma Glucose 04/27/2023 102.54  mg/dL Final   Performed at [facility], [address], [place], [place] [address]   Alcohol, Ethyl (B) 04/27/2023 285 (H)  <10 mg/dL Final   Comment: (NOTE) For medical purposes only. Performed at [facility], [address], [place], [place] [address]    Cholesterol 04/27/2023 200  0 - 200 mg/dL Final   Triglycerides 95/83/7974 93  <150 mg/dL Final   HDL 95/83/7974 113  >40 mg/dL Final   Total CHOL/HDL Ratio 04/27/2023 1.8  RATIO Final   VLDL 04/27/2023 19  0 - 40 mg/dL Final   LDL Cholesterol 04/27/2023 68  0 - 99 mg/dL Final   Comment:        Total Cholesterol/HDL:CHD Risk Coronary Heart Disease Risk Table                     Men   Women  1/2 Average Risk   3.4   3.3  Average Risk       5.0   4.4  2 X Average Risk   9.6   7.1  3 X Average Risk  23.4   11.0        Use the calculated Patient Ratio above and the CHD Risk Table to determine the patient's CHD Risk.        ATP III CLASSIFICATION (LDL):  <100     mg/dL   Optimal  899-870  mg/dL   Near or Above                    Optimal  130-159  mg/dL    Borderline  839-810  mg/dL   High  >809     mg/dL   Very High Performed at [facility], [address] [place], [place] [address]    TSH 04/27/2023 0.202 (L)  0.350 - 4.500 uIU/mL Final   Comment: Performed by a 3rd Generation assay with a functional sensitivity of <=0.01 uIU/mL. Performed at [facility], [address] [place], [place] [address]    POC Amphetamine UR 04/27/2023 None Detected  NONE DETECTED (Cut Off Level 1000 ng/mL) Final   POC Secobarbital (BAR) 04/27/2023 None Detected  NONE DETECTED (Cut Off Level 300 ng/mL) Final   POC Buprenorphine (BUP) 04/27/2023 None Detected  NONE DETECTED (Cut Off Level 10 ng/mL) Final   POC Oxazepam (BZO) 04/27/2023 None Detected  NONE DETECTED (Cut Off Level 300 ng/mL) Final   POC Cocaine UR 04/27/2023 None Detected  NONE DETECTED (Cut Off Level 300 ng/mL) Final   POC Methamphetamine UR 04/27/2023 None Detected  NONE DETECTED (Cut Off Level 1000 ng/mL) Final   POC Morphine  04/27/2023 None Detected  NONE DETECTED (Cut Off Level 300 ng/mL) Final   POC Methadone UR 04/27/2023 None Detected  NONE DETECTED (Cut Off Level 300 ng/mL) Final   POC Oxycodone UR 04/27/2023 None Detected  NONE DETECTED (Cut Off Level 100 ng/mL) Final   POC Marijuana UR 04/27/2023 None Detected  NONE DETECTED (Cut Off Level 50 ng/mL) Final    Blood Alcohol level:  Lab Results  Component Value Date   ETH 129 (  H) 08/02/2023   ETH 285 (H) 04/27/2023    Metabolic Disorder Labs: Lab Results  Component Value Date   HGBA1C 5.2 04/27/2023   MPG 102.54 04/27/2023   MPG 99.67 02/08/2022   No results found for: PROLACTIN Lab Results  Component Value Date   CHOL 155 08/04/2023   TRIG 93 08/04/2023   HDL 86 08/04/2023   CHOLHDL 1.8 08/04/2023   VLDL 19 08/04/2023   LDLCALC 50 08/04/2023   LDLCALC 68 04/27/2023    Therapeutic Lab Levels: No results found for: LITHIUM No results found for: VALPROATE No results found for: CBMZ  Physical  Findings   AIMS    Flowsheet Row [facility]  AIMS Total Score 0   AUDIT    Flowsheet Row [facility]  Alcohol Use Disorder Identification Test Final Score (AUDIT) 36   PHQ2-9    Flowsheet Row ED from 08/03/2023 in [facility] ED from 04/27/2023 in [facility] ED from 02/13/2022 in [facility]  PHQ-2 Total Score 6 0 2  PHQ-9 Total Score 18 -- 5   Flowsheet Row ED from 08/03/2023 in [facility] ED from 08/02/2023 in [facility] ED from 04/27/2023 in [facility]  C-SSRS RISK CATEGORY High Risk High Risk High Risk     Musculoskeletal  Strength & Muscle Tone: within normal limits Gait & Station: normal Patient leans: N/A  Psychiatric Specialty Exam  Presentation  General Appearance:  Appropriate for Environment  Eye Contact: Fair  Speech: Clear and Coherent  Speech Volume: Normal  Handedness: Right   Mood and Affect  Mood: Euthymic  Affect: Congruent   Thought Process  Thought Processes: Coherent  Descriptions of Associations:Intact  Orientation:Full (Time, Place and Person)  Thought Content:Logical  Diagnosis of Schizophrenia or Schizoaffective disorder in past: No    Hallucinations:Hallucinations: None  Ideas of Reference:None  Suicidal Thoughts:Suicidal Thoughts: No  Homicidal Thoughts:Homicidal Thoughts: No   Sensorium  Memory: Recent Fair; Remote Fair  Judgment: Fair  Insight: Fair   Chartered certified accountant: Fair  Attention Span: Fair  Recall: [employer] of Knowledge: Fair  Language: Fair   Psychomotor Activity  Psychomotor Activity: Psychomotor Activity: Normal   Assets   Assets: Communication Skills; Desire for Improvement   Sleep  Sleep: Sleep: Fair  Physical Exam  Physical Exam Cardiovascular:     Rate and Rhythm: Normal rate.  Pulmonary:     Effort: Pulmonary effort is normal.  Musculoskeletal:        General: Normal range of motion.     Cervical back: Normal range of motion.  Neurological:     Mental Status: He is alert and oriented to person, place, and time.    Review of Systems  Constitutional:  Positive for chills.  Respiratory: Negative.    Cardiovascular: Negative.   Gastrointestinal:  Positive for nausea.  Neurological: Negative.    Blood pressure 107/61, pulse 64, temperature 97.9 F (36.6 C), temperature source Oral, resp. rate 18, SpO2 98%. There is no height or weight on file to calculate BMI.  Treatment Plan Summary: Per chart review, patient was hospitalized at [facility] from 04/15/17-04/19/17 and diagnosed with bipolar disorder and alcohol abuse. He was prescribed Zyprexa  5 mg at bedtime, Zoloft  50 mg daily  and Gabapentin  100 mg  TID.   Medication regimen Discontinue 7/26-Seroquel  100 mg nightly for psychosis and mood stabilization. Continue Librium  25 mg taper for alcohol/benzo withdrawal, ended on 08/06/23 Continue Olanzapine  5 mg po at bedtime for psychosis  Continue Zoloft  50 mg po daily for depression     PRNS -Continue Agitation protocol medications:Zyprexa  PO/IM as per the MAR -Continue Hydroxyzine  25 mg TID PRN for anxiety -Continue Tylenol  650 mg every 6 hours PRN for mild pain -Continue Maalox 30 mg every 4 hrs PRN for indigestion -Continue Milk of Magnesia as needed every 6 hrs for constipation   Labs Reviewed: slightly elevated LFTs most likely related to alcohol consumption.   Discharge Planning: Social work and case management to assist with discharge planning and identification of hospital follow-up needs prior to discharge Estimated LOS: 5-7 days Discharge Concerns: Anticipated discharge date Monday,  August 08, 2023. Need to establish a safety plan; Medication compliance and effectiveness Discharge Goals: Return home with outpatient referrals for mental health follow-up including medication management/psychotherapy  Teresa Wyline CROME, NP 08/07/2023 9:03 AM

## 2023-08-07 NOTE — ED Notes (Signed)
 Patient is in the dayroom composed chatting and watching TV with other patients. NAD. Stated feeling better after he was given some medicine earlier today.  Environment secured per policy. Will monitor patient for safety.

## 2023-08-07 NOTE — Group Note (Signed)
 Group Topic: Understanding Self  Group Date: 08/07/2023 Start Time: 0800 End Time: 0830 Facilitators: Lonzell Dwayne RAMAN, NT  Department: [facility]  Number of Participants: 3  Group Focus: self-awareness Treatment Modality:  Individual Therapy Interventions utilized were patient education Purpose: reinforce self-care  Name: Thomas Vaughn Date of Birth: [birth year]  MR: [id number]    Level of Participation: Patient attended groupactive Quality of Participation: attentive Interactions with others: gave feedback Mood/Affect: labile Triggers (if applicable): N/A Cognition: not focused Progress: Minimal Response: Appropriate Plan: patient will be encouraged to try to stay focused on what he is saying, sometimes he is all over the place when he speaks.  Patients Problems:  Patient Active Problem List   Diagnosis Date Noted   Alcohol abuse 04/16/2017   Bipolar disorder, unspecified (HCC) 04/15/2017   ADHD (attention deficit hyperactivity disorder)    HYPOKALEMIA 07/29/2009   ANEMIA OF OTHER CHRONIC DISEASE 07/29/2009   GYNECOMASTIA 07/29/2009   CHEST PAIN UNSPECIFIED 07/29/2009

## 2023-08-07 NOTE — ED Notes (Signed)
 Pt requesting multiple times to leave.  Was pacing in front of nursing station.  Given prn medication for agitation.  Patrice NP came over and deescalated pt.  Currently cooperative on the phone.

## 2023-08-07 NOTE — Group Note (Signed)
 Group Topic: Relaxation  Group Date: 08/07/2023 Start Time: 1300 End Time: 1330 Facilitators: Belle Camellia SAILOR, RN  Department: [facility]  Number of Participants: 2  Group Focus: relaxation Treatment Modality:  Patient-Centered Therapy Interventions utilized were patient education and support Purpose: reinforce self-care  Name: Thomas Vaughn Date of Birth: 02-10-1990  MR: [id number]    Level of Participation: Pt did not attended group  Quality of Participation: n/a Interactions with others: n/a Mood/Affect: n/a Triggers (if applicable): n/a Cognition: n/a Progress: n/a Response: n/a Plan: patient will be encouraged to continue with treatment plan  Patients Problems:  Patient Active Problem List   Diagnosis Date Noted   Alcohol abuse 04/16/2017   Bipolar disorder, unspecified (HCC) 04/15/2017   ADHD (attention deficit hyperactivity disorder)    HYPOKALEMIA 07/29/2009   ANEMIA OF OTHER CHRONIC DISEASE 07/29/2009   GYNECOMASTIA 07/29/2009   CHEST PAIN UNSPECIFIED 07/29/2009

## 2023-08-08 DIAGNOSIS — F10129 Alcohol abuse with intoxication, unspecified: Secondary | ICD-10-CM | POA: Diagnosis not present

## 2023-08-08 DIAGNOSIS — F1994 Other psychoactive substance use, unspecified with psychoactive substance-induced mood disorder: Secondary | ICD-10-CM | POA: Diagnosis not present

## 2023-08-08 DIAGNOSIS — F22 Delusional disorders: Secondary | ICD-10-CM | POA: Diagnosis not present

## 2023-08-08 MED ORDER — OLANZAPINE 5 MG PO TABS: 5.0000 mg | ORAL_TABLET | Freq: Every day | ORAL | 0 refills | Status: AC

## 2023-08-08 MED ORDER — SERTRALINE HCL 50 MG PO TABS: 50.0000 mg | ORAL_TABLET | Freq: Every day | ORAL | 0 refills | Status: AC

## 2023-08-08 MED ORDER — ADULT MULTIVITAMIN W/MINERALS CH: 1.0000 | ORAL_TABLET | Freq: Every day | ORAL | Status: AC

## 2023-08-08 NOTE — ED Notes (Signed)
 Patient asleep in the bedroom.NAD. Will continue to monitor for safety

## 2023-08-08 NOTE — ED Provider Notes (Signed)
 FBC/OBS ASAP Discharge Summary  Date and Time: 08/08/2023 9:44 AM  Name: Thomas Vaughn  MRN:  [id number]   Discharge Diagnoses:  Final diagnoses:  Paranoia [facility])  Alcohol use disorder  Substance induced mood disorder (HCC)  Substance abuse (HCC)  Discharge diagnosis most consistent with substance induced mood disorder, differential includes MDD with psychotic features however patient's psychosis is resolved and mood quickly improved during admission with is indicative of a substance induced component.  Subjective: Patient reports good mood and denies SI, HI or AVH. Reports fair sleep and appetite. Objectively, no signs of acute psychosis and he does not appear to be responding to internal or external stimuli. He denies depressive or anxiety symptoms today.  He denies alcohol/benzo withdrawal symptoms or cravings. Declines naltrexone He denies physical pain. He denies medication side effects to starting olanzapine  and Zoloft . Requesting discharge and plan to attend SA-IOP  Stay Summary:  33 year old male patient with a past psychiatric history significant for alcohol use disorder, MDD and GAD who presented to the [facility] on 08/03/23 with complaints of suicidal thoughts with no plan or intent, alcohol abuse, drinking a pint of vodka daily and 4 locos and abusing Xanax  and Valium from off the street. BAL on arrival was 129. UDS was pos for benzodiazepines.   Patient was admitted to inpatient psychiatry at [facility] for safety and stabilization. Patient was provided safe and therapeutic milieu, psychiatric and medical assessment, care and treatment, as well as support from nursing, behavioral health staff. Both psychotherapy and psychoeducation groups were provided. Different coping skills such as journaling, CBT and art therapy groups were offered. Additional consultation was provided by hospitalist for H&P and medical needs.  Patient was started on  Zoloft  50 mg qdaily and Zyprexa  5 mg qdaily during the admission for MDD with psychotic features versus substance induced mood disorder. Patient was started on a librium  taper + CIWA for abusing benzodiazepine and alcohol. Withdrawal symptoms resolved. Patient was offered naltrexone on day of discharge when I first saw him but he declined and requested disharge with SA-IOP followup. Patient tolerated without side effects and medications were titrated to therapeutic effect. As patient stabilized on medications and participated in therapeutic interventions, symptoms began to improve. Patient denied SI, HI or AVH for >48 hours prior to discharge.  On the day of discharge, the chart was reviewed, case was discussed with staff and the patient was seen in person. Patient's overall mood has improved. Denies access to guns.Patient was calm and cooperative and did not appear anxious. Patient reported adequate sleep and stable mood. Patient was tolerating medications well without side effects reported or noted. Patient denied suicidal ideation, plan or intent, denied hopelessness, helplessness or worthlessness, and denied homicidal ideation. Insight and judgement have improved. Patient demonstrated future orientation and was motivated to follow-up with aftercare. Patient was encouraged to be adherent with medications. Patient was instructed to call 911, ask for help to go to the closest emergency room or crisis center, call crisis hotlines for help if in critical status or when symptoms were worsening. Patient voiced understanding of this information. At the time of discharge, patient had reached maximum benefit from hospitalization, was no longer considered to be dangerous to self or others, and was psychiatrically stable and otherwise appropriate for discharge to less restrictive care in the community.   Medical Hospital Course: Patient was seen by the hospitalist for routine admission examination. Medications for  chronic conditions were continued.  Medical  hospital course was otherwise unremarkable.    Total Time spent with patient: 30 minutes  Past Psychiatric History: History of alcohol use disorder, MDD and GAD Past Medical History: No reported history. Family History: Per chart review, family history of cardiac disease Family Psychiatric  History:  Per patient's mother, she has bipolar depression and PTSD and has to take medications. Social History: Patient resides with his girlfriend and girlfriend has 7 children that he helps to take care of. He has a children 8 of his own. Tobacco Cessation:  A prescription for an FDA-approved tobacco cessation medication was offered at discharge and the patient refused  Current Medications:  Current Facility-Administered Medications  Medication Dose Route Frequency Provider Last Rate Last Admin   acetaminophen  (TYLENOL ) tablet 650 mg  650 mg Oral Q6H PRN Trudy Carwin, NP   650 mg at 08/04/23 0915   alum & mag hydroxide-simeth (MAALOX/MYLANTA) 200-200-20 MG/5ML suspension 30 mL  30 mL Oral Q4H PRN Trudy Carwin, NP       magnesium  hydroxide (MILK OF MAGNESIA) suspension 30 mL  30 mL Oral Daily PRN Trudy Carwin, NP       multivitamin with minerals tablet 1 tablet  1 tablet Oral Daily Trudy Carwin, NP   1 tablet at 08/08/23 9085   OLANZapine  (ZYPREXA ) injection 10 mg  10 mg Intramuscular TID PRN Trudy Carwin, NP       OLANZapine  (ZYPREXA ) injection 5 mg  5 mg Intramuscular TID PRN Trudy Carwin, NP       OLANZapine  (ZYPREXA ) tablet 5 mg  5 mg Oral QHS White, Patrice L, NP   5 mg at 08/07/23 2118   OLANZapine  zydis (ZYPREXA ) disintegrating tablet 5 mg  5 mg Oral TID PRN Trudy Carwin, NP   5 mg at 08/07/23 1754   sertraline  (ZOLOFT ) tablet 50 mg  50 mg Oral Daily White, Patrice L, NP   50 mg at 08/08/23 9085   thiamine  (VITAMIN B1) injection 100 mg  100 mg Intramuscular Once Trudy Carwin, NP       Current Outpatient Medications  Medication Sig Dispense  Refill   [START ON 08/09/2023] Multiple Vitamin (MULTIVITAMIN WITH MINERALS) TABS tablet Take 1 tablet by mouth daily.     nicotine  (NICODERM CQ  - DOSED IN MG/24 HOURS) 14 mg/24hr patch Place 1 patch (14 mg total) onto the skin daily. 28 patch 0   OLANZapine  (ZYPREXA ) 5 MG tablet Take 1 tablet (5 mg total) by mouth at bedtime. 30 tablet 0   [START ON 08/09/2023] sertraline  (ZOLOFT ) 50 MG tablet Take 1 tablet (50 mg total) by mouth daily. 30 tablet 0   thiamine  (VITAMIN B-1) 100 MG tablet Take 1 tablet (100 mg total) by mouth daily. (Patient not taking: Reported on 08/02/2023) 30 tablet 0    PTA Medications:  Facility Ordered Medications  Medication   acetaminophen  (TYLENOL ) tablet 650 mg   alum & mag hydroxide-simeth (MAALOX/MYLANTA) 200-200-20 MG/5ML suspension 30 mL   magnesium  hydroxide (MILK OF MAGNESIA) suspension 30 mL   thiamine  (VITAMIN B1) injection 100 mg   multivitamin with minerals tablet 1 tablet   [EXPIRED] hydrOXYzine  (ATARAX ) tablet 25 mg   [EXPIRED] loperamide  (IMODIUM ) capsule 2-4 mg   [EXPIRED] ondansetron  (ZOFRAN -ODT) disintegrating tablet 4 mg   OLANZapine  zydis (ZYPREXA ) disintegrating tablet 5 mg   OLANZapine  (ZYPREXA ) injection 5 mg   OLANZapine  (ZYPREXA ) injection 10 mg   [EXPIRED] chlordiazePOXIDE  (LIBRIUM ) capsule 25 mg   [COMPLETED] chlordiazePOXIDE  (LIBRIUM ) capsule 25 mg   Followed by   [  COMPLETED] chlordiazePOXIDE  (LIBRIUM ) capsule 25 mg   Followed by   [COMPLETED] chlordiazePOXIDE  (LIBRIUM ) capsule 25 mg   Followed by   [COMPLETED] chlordiazePOXIDE  (LIBRIUM ) capsule 25 mg   sertraline  (ZOLOFT ) tablet 50 mg   OLANZapine  (ZYPREXA ) tablet 5 mg   PTA Medications  Medication Sig   nicotine  (NICODERM CQ  - DOSED IN MG/24 HOURS) 14 mg/24hr patch Place 1 patch (14 mg total) onto the skin daily.   thiamine  (VITAMIN B-1) 100 MG tablet Take 1 tablet (100 mg total) by mouth daily. (Patient not taking: Reported on 08/02/2023)   OLANZapine  (ZYPREXA ) 5 MG tablet Take  1 tablet (5 mg total) by mouth at bedtime.   [START ON 08/09/2023] sertraline  (ZOLOFT ) 50 MG tablet Take 1 tablet (50 mg total) by mouth daily.   [START ON 08/09/2023] Multiple Vitamin (MULTIVITAMIN WITH MINERALS) TABS tablet Take 1 tablet by mouth daily.       08/08/2023    9:43 AM 08/06/2023    9:00 AM 08/03/2023    1:05 AM  Depression screen PHQ 2/9  Decreased Interest 0 3 3  Down, Depressed, Hopeless 0 3 3  PHQ - 2 Score 0 6 6  Altered sleeping  3 3  Tired, decreased energy  3 3  Change in appetite  0 3  Feeling bad or failure about yourself   3 3  Trouble concentrating  3 1  Moving slowly or fidgety/restless  0 1  Suicidal thoughts  0 3  PHQ-9 Score  18 23  Difficult doing work/chores  Extremely dIfficult     Flowsheet Row ED from 08/03/2023 in [facility] ED from 08/02/2023 in [facility] at [facility] ED from 04/27/2023 in [facility]  C-SSRS RISK CATEGORY High Risk High Risk High Risk    Musculoskeletal  Strength & Muscle Tone: within normal limits Gait & Station: normal Patient leans: N/A  Psychiatric Specialty Exam  Presentation  General Appearance:  Appropriate for Environment  Eye Contact: Fair  Speech: Clear and Coherent  Speech Volume: Normal  Handedness: Right   Mood and Affect  Mood: Euthymic  Affect: Appropriate   Thought Process  Thought Processes: Coherent  Descriptions of Associations:Intact  Orientation:Full (Time, Place and Person)  Thought Content:Logical  Diagnosis of Schizophrenia or Schizoaffective disorder in past: No  Duration of Psychotic Symptoms: Less than six months   Hallucinations:Hallucinations: None  Ideas of Reference:None  Suicidal Thoughts:Suicidal Thoughts: No  Homicidal Thoughts:Homicidal Thoughts: No   Sensorium  Memory: Immediate Fair  Judgment: Fair  Insight: Fair   Art therapist   Concentration: Fair  Attention Span: Fair  Recall: Fair  Fund of Knowledge: Fair  Language: Fair   Psychomotor Activity  Psychomotor Activity: Psychomotor Activity: Normal   Assets  Assets: Desire for Improvement; Communication Skills; Housing; Physical Health   Sleep  Sleep: Sleep: Fair  Estimated Sleeping Duration (Last 24 Hours): 12.50-14.00 hours  No data recorded  Physical Exam  Physical Exam ROS Blood pressure 126/81, pulse 85, temperature 98.5 F (36.9 C), temperature source Oral, resp. rate 18, SpO2 97%. There is no height or weight on file to calculate BMI.  Demographic Factors:  Male  Loss Factors: Decrease in vocational status and Financial problems/change in socioeconomic status  Historical Factors: NA  Risk Reduction Factors:   Living with another person, especially a relative, Positive social support, Positive therapeutic relationship, and Positive coping skills or problem solving skills  Continued Clinical Symptoms:  Alcohol/Substance Abuse/Dependencies  Cognitive Features That Contribute To Risk:  None    Suicide Risk:  Minimal: No identifiable suicidal ideation.  Patients presenting with no risk factors but with morbid ruminations; may be classified as minimal risk based on the severity of the depressive symptoms  Patient denies SI or HI for >48 hours. Denies wanting to be dead and future oriented. SRA complete and acute risk for suicide is low.   [place] Suicide Risk assessment:  1. Do you wish to be dead? NONE REPORTED 2. Have you wished your dead or wished you could go to sleep and not wake up? NONE REPORTED 3.  Have you actually had thoughts of killing yourself?  NONE REPORTED 4.  Have you been thinking about how you might do this?  NONE REPORTED 5.  Have you had these thoughts and some intention of acting on them? NONE REPORTED 6.  Have you started to work out or worked out the details to kill yourself? NONE REPORTED 7.   Do you intend to carry out this plan? NONE REPORTED 8. On a scale of 1-5 with 1 being the least severe and 5 being the most severe answer the following questions place for intensity of ideation. ZERO 9. How many times have you had these thoughts? NONE REPORTED 10. When you have the thoughts how long to the last?  NONE REPORTED 11. Control ability.  Could you or can you stop thinking about killing herself or wanting to die if you want to?  YES 12. Are there any things anyone or anything family religion pain of death that stop you from wanting to die or acting on thoughts of committing suicide?  FAMILY 13.  What sort of reason to do have to think about wanting to die or killing yourself? NONE REPORTED 14.Was it to end the pain or stop the way you are feeling in other words you could not go on living with his pain or how you are feeling or was not to get attention revenge or reaction from others?  Or both?  NONE REPORTED  [place] Suicide Risk assessment:  1. Do you wish to be dead? NONE REPORTED 2. Have you wished your dead or wished you could go to sleep and not wake up? NONE REPORTED 3.  Have you actually had thoughts of killing yourself?  NONE REPORTED 4.  Have you been thinking about how you might do this?  NONE REPORTED 5.  Have you had these thoughts and some intention of acting on them? NONE REPORTED 6.  Have you started to work out or worked out the details to kill yourself? NONE REPORTED 7.  Do you intend to carry out this plan? NONE REPORTED 8. On a scale of 1-5 with 1 being the least severe and 5 being the most severe answer the following questions place for intensity of ideation. ZERO 9. How many times have you had these thoughts? NONE REPORTED 10. When you have the thoughts how long to the last?  NONE REPORTED 11. Control ability.  Could you or can you stop thinking about killing herself or wanting to die if you want to?  YES 12. Are there any things anyone or anything family religion  pain of death that stop you from wanting to die or acting on thoughts of committing suicide?  FAMILY 13.  What sort of reason to do have to think about wanting to die or killing yourself? NONE REPORTED 14.Was it to end the pain or stop the way you are  feeling in other words you could not go on living with his pain or how you are feeling or was not to get attention revenge or reaction from others?  Or both?  NONE REPORTED   Plan Of Care/Follow-up recommendations:   Disposition: home, SA-IOP appointment  Phillipa Morden, MD 08/08/2023, 9:44 AM

## 2023-08-08 NOTE — ED Notes (Signed)
 Patient asleep with eyes closed. NAD  Will continue to monitor for safety.

## 2023-08-08 NOTE — ED Notes (Signed)
 Patient asleep,CIWA assessment not completed at this time.

## 2023-08-08 NOTE — ED Notes (Signed)
 Discharge paperwork explained to pt, questions denies. Pt escorted off unit by staff, items returned.

## 2023-08-08 NOTE — ED Notes (Signed)
 Pt denies si hi, verbal contract for safety provided. Pt anxious, saying that he wants to be discharge. Rn touched base with sw about dc, pt has just spoken wit provider. Pt compliant with morning medication, medications reviewed by RN. Pt denies pain and physical discomforts. Pt denies symptoms of withdrawal. Pt denies agitation, says he just has anxiety. Pt says he feels 'normal' 'fine' and is ready to discharge.

## 2023-08-08 NOTE — Group Note (Signed)
 Group Topic: Relapse and Recovery  Group Date: 08/07/2023 Start Time: 2000 End Time: 2045 Facilitators: Lenon Lenice SAUNDERS, NT  Department: [facility]  Number of Participants: 4  Group Focus: coping skills Treatment Modality:  Individual Therapy Interventions utilized were group exercise Purpose: express feelings  Name: Thomas Vaughn Date of Birth: 1990/09/08  MR: [id number]    Level of Participation: active Quality of Participation: engaged Interactions with others: gave feedback Mood/Affect: appropriate Triggers (if applicable):  Cognition: coherent/clear Progress: Moderate Response:  Plan: follow-up needed  Patients Problems:  Patient Active Problem List   Diagnosis Date Noted   Alcohol abuse 04/16/2017   Bipolar disorder, unspecified (HCC) 04/15/2017   ADHD (attention deficit hyperactivity disorder)    HYPOKALEMIA 07/29/2009   ANEMIA OF OTHER CHRONIC DISEASE 07/29/2009   GYNECOMASTIA 07/29/2009   CHEST PAIN UNSPECIFIED 07/29/2009

## 2023-10-20 ENCOUNTER — Ambulatory Visit (HOSPITAL_COMMUNITY): Admission: EM | Admit: 2023-10-20 | Discharge: 2023-10-20 | Disposition: A | Discharge status: Home / Self Care

## 2023-10-20 DIAGNOSIS — Z765 Malingerer [conscious simulation]: Secondary | ICD-10-CM

## 2023-10-20 DIAGNOSIS — F1090 Alcohol use, unspecified, uncomplicated: Secondary | ICD-10-CM | POA: Diagnosis not present

## 2023-10-20 NOTE — Discharge Instructions (Addendum)
 Based on the information that you have provided and the presenting issues outpatient services and resources for have been recommended.  It is imperative that you follow through with treatment recommendations within 5-7 days from the of discharge to mitigate further risk to your safety and mental well-being. A list of referrals has been provided below to get you started.  You are not limited to the list provided.  In case of an urgent crisis, you may contact the Mobile Crisis Unit with [facility] at [phone].  Substance Abuse Resources     SUBSTANCE USE TREATMENT for Medicaid and State Funded/IPRS  [facility] [address], [place], [address] [phone] phone NOTE: [facility] is no longer offering IOP services.  Serves those who are low-income or have no insurance.  [facility] [address] [place], [place], [address] [phone] phone [phone] fax NOTE: Does have Substance Abuse-Intensive Outpatient Program [facility]) as well as transitional housing if eligible.  [facility] [address] [place], [place], [address] [phone] phone [phone] fax  [facility] [phone] [place], [place], [address] [phone] phone [phone] fax    HALFWAY HOUSES:  [facility] [phone]  [employer].[email or web address]     12 STEP PROGRAMS:  Alcoholics Anonymous of [place] Software[employer]alet.be  Narcotics Anonymous of [place] Hit[employer]ct.dk  Al-Anon of [place], [place] www.[place]alanon.org/find-meetings.html  Nar-Anon https://nar-anon.org/find-a-meetin      List of Residential placements:   [facility] in [place]: [phone]  [facility]: [phone]  [place], [place] [phone]: Male and male facility; 30-day program: (uninsured and Medicaid such as Laurena Bering,  [place], [place], partners)  [facility]: [phone]; men and women's facility; 28 days; Can have Medicaid tailored plan Tour manager or Partners)  [facility]: [phone] Karoline Caldwell or Larita Fife; 28 day program; must be fully detox; tailored Medicaid or no insurance  [address] in [place], [place]; [phone]; 28 day all males program; no insurance accepted  [facility] Referral in [place]: Gabriel Rung [phone] (no insurance or Medicaid only); 90 days; outpatient services but provide housing in apartments [place]  [facility] Admission: [phone]: Patient must complete phone screening for placement: [place], [place]; 6 month program; uninsured, Medicaid, and [employer].   [facility]: no insurance required; [phone]  [facility]: [phone]; Intake: Molly Maduro; Must fill out application online; Alecia Lemming Delay [phone] x [phone] Mission in [place], [place]: [phone]; Admissions Coordinators Mr. Maurine Minister or Barron Alvine; 90 day program.  [facility]: [place], [place] [phone]; Co-Ed 9 month to a year program; Online application; Men entry fee is $500 (6-32months);  [employer]: [address] [place], [place] [address]; no fee or insurance required; minimum of 2 years; Highly structured; work based; Intake Coordinator is Thayer Ohm [phone]  [facility] in [place], [place]: [phone]; Fax number is [phone]; website: www.[facility].org; Requires 3-6 page autobiography; 2 year program (18 months and then 17month transitional housing); Admission fee is $300; no insurance needed; work Automotive engineer in [place], [place]: [employer] Desk Staff: Danise Edge [phone]: They have a Men's Regenerations Program 6-60months. Free program; There is an initial $300 fee however, they are willing to work with patients regarding that. Application is online.  [facility]: Admissions  [phone] Doran Heater ext [phone]; Any 7-90 day program is out of pocket; 12 month program is free of charge; there is a $275 entry fee; Patient is responsible for own transportation

## 2023-10-20 NOTE — ED Notes (Signed)
 Patient is resting comfortably.

## 2023-10-20 NOTE — ED Provider Notes (Signed)
 Behavioral Health Urgent Care Medical Screening Exam  Patient Name: Thomas Vaughn MRN: [id number] Date of Evaluation: 10/20/23 Chief Complaint:  socioeconomic stressors causing alcohol use Diagnosis:  Final diagnoses:  Alcohol use disorder  Malingering   History of Present illness: Thomas Vaughn is a 33 y.o. male with a history significant for alcohol use disorder who presented earlier today morning with complaints of suicidal ideation with a plan to overdose on pills.  Assessment: As per patient, he called law enforcement reports that he was having suicidal ideations with a plan to overdose on pills, they presented to this location transported him to this [facility].  On assessment, patient was obviously very intoxicated, verbalized that he had made the suicidal ideations in the context of being very intoxicated, also shared that he suicidal thoughts are chronic, and never would go a way.  He reports that he is stressed due to having to pay child support, stressed about his children, states that he has 8 children, he is stressed about having to provide for them working at [employer], stressed about his family, and everything in his life.  He states that his suicidal ideations have not worsened in intensity since discharge from the facility based crisis in July of this year.  Patient is asked why he did not follow through with his outpatient appointments after discharge and he states I did not like it, and I did not want to.  He denies homicidal ideations, talks about wanting to fight people with his fists, denies any plans or intent to end his life.  Reports paranoia at baseline since the death of his brother a few years ago, reports being suspicious of everyone, states that he prefers the doors at his house to be locked all the time due to fears that someone is watching him all the time.  However, he states that he is not doing what he needs to do to feel  better such as taking his medications.  States that shit don't work in reference to his medications.  States that he was on olanzapine , unable to remember the other medication that he is on.  States I do not remember nothing I did.  I got memory loss.  Do alcohol affect on you like that? I don't remember shit.   Patient's chart is reviewed, it is noted that patient typically makes requests to be discharged shortly after hospitalizations, historically he called law enforcement in the context of intoxication, and is typically fine after sobering up and requesting to be discharged.  Historically, he is noncompliant with medications.  Recommendations: Outpatient medication management and therapy at the open access clinic on the second floor at the [facility].  Patient has been educated on this, verbalizes understanding.  Resources for rehabilitation for substance abuse have been placed in his after visit summary, he has been educated on this, and has verbalized understanding as well.  Suicide Risk Assessment  SUICIDE RISK:  Chronic:  Frequent, and enduring suicidal ideation, with frequent presentations to the ER or [facility]. Currently denies plans, there is no subjective intent, but some objective markers of intent (i.e., choice of lethal method), the method is accessible, however, there is no preparatory behavior, but there is some evidence of impaired self-control, severe dysphoria/symptomatology. There are multiple risk factors present, and few if any protective factors, but he has the availability of social support in his mother and community resources which he has been educated on. He has also  been educated that he can always come back to this location as well at the urgent care if mental status worsens. Patient historically has  not shown motivation to sustain and maintain his sobriety after discharge. Given that he has not taken any medications for at least two weeks now as  per his admission, he has been educated to reestablish care at the open access clinic to get restarted on medications. No medications were ordered at discharge.  Flowsheet Row ED from 10[facility]20/2023 in [facility] ED from 08/03/2023 in [facility] ED from 08/02/2023 in [facility] Department at [facility]  C-SSRS RISK CATEGORY High Risk High Risk High Risk    Psychiatric Specialty Exam  Presentation  General Appearance:Fairly Groomed  Eye Contact:Fair  Speech:Clear and Coherent  Speech Volume:Normal  Handedness:Right   Mood and Affect  Mood: Euthymic  Affect: Congruent   Thought Process  Thought Processes: Coherent  Descriptions of Associations:Intact  Orientation:Full (Time, Place and Person)  Thought Content:Logical  Diagnosis of Schizophrenia or Schizoaffective disorder in past: No  Duration of Psychotic Symptoms: Less than six months  Hallucinations:None  Ideas of Reference:None  Suicidal Thoughts:Yes, Passive Without Intent; Without Plan  Homicidal Thoughts:No   Sensorium  Memory: Immediate Fair  Judgment: Fair  Insight: Fair   Chartered certified accountant: Fair  Attention Span: Fair  Recall: [employer] of Knowledge: Fair  Language: Fair   Psychomotor Activity  Psychomotor Activity: Normal   Assets  Assets: Resilience   Sleep  Sleep: Fair  Number of hours:  0   Physical Exam: Physical Exam Vitals reviewed.    Review of Systems  Psychiatric/Behavioral:  Positive for depression and substance abuse. Negative for hallucinations, memory loss and suicidal ideas. The patient is nervous/anxious and has insomnia.   All other systems reviewed and are negative.  Blood pressure (!) 134/96, pulse 78, temperature 98.6 F (37 C), temperature source Oral, resp. rate 16, SpO2 96%. There is no height or weight on file to calculate  BMI.  Musculoskeletal: Strength & Muscle Tone: within normal limits Gait & Station: normal Patient leans: N/A  [facility] MSE Discharge Disposition for Follow up and Recommendations: Based on my evaluation the patient does not appear to have an emergency medical condition and can be discharged with resources and follow up care in outpatient services for Medication Management and Individual Therapy  Follow up with [facility] Residents Only  Walk-in hours for open access (medication management and therapy) are Monday - Friday 8 am to 11 am. Appointments are limited, so please arrive at 0645 am. Upon arrival, please complete the form on the clipboard located at the front desk. If there are no clipboards available, all appointments have been filled for that day.  [facility] [address] 2nd Floor [place]  [address] [phone]   Donia Snell, NP 10/20/2023, 1:46 PM
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
